# Patient Record
Sex: Female | Born: 1985
Health system: Southern US, Community
[De-identification: ages and names within clinical notes are randomized; demographics above are authoritative.]

## PROBLEM LIST (undated history)

## (undated) DIAGNOSIS — N83209 Unspecified ovarian cyst, unspecified side: Secondary | ICD-10-CM

## (undated) DIAGNOSIS — R000 Tachycardia, unspecified: Secondary | ICD-10-CM

## (undated) DIAGNOSIS — Z5189 Encounter for other specified aftercare: Secondary | ICD-10-CM

## (undated) DIAGNOSIS — U099 Post covid-19 condition, unspecified: Secondary | ICD-10-CM

## (undated) DIAGNOSIS — F419 Anxiety disorder, unspecified: Secondary | ICD-10-CM

## (undated) DIAGNOSIS — B977 Papillomavirus as the cause of diseases classified elsewhere: Secondary | ICD-10-CM

## (undated) DIAGNOSIS — Z319 Encounter for procreative management, unspecified: Secondary | ICD-10-CM

## (undated) DIAGNOSIS — R0609 Other forms of dyspnea: Secondary | ICD-10-CM

## (undated) DIAGNOSIS — F32A Depression, unspecified: Secondary | ICD-10-CM

## (undated) DIAGNOSIS — J45909 Unspecified asthma, uncomplicated: Secondary | ICD-10-CM

## (undated) DIAGNOSIS — R569 Unspecified convulsions: Secondary | ICD-10-CM

## (undated) HISTORY — DX: Unspecified convulsions: R56.9

## (undated) HISTORY — PX: NO PAST SURGERIES: SHX2092

## (undated) HISTORY — DX: Encounter for other specified aftercare: Z51.89

---
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
## 2007-08-13 ENCOUNTER — Emergency Department (HOSPITAL_COMMUNITY): Admission: EM | Admit: 2007-08-13 | Discharge: 2007-08-13 | Payer: Self-pay | Admitting: Family Medicine

## 2008-06-06 ENCOUNTER — Other Ambulatory Visit: Payer: Self-pay | Admitting: Emergency Medicine

## 2008-06-06 ENCOUNTER — Inpatient Hospital Stay (HOSPITAL_COMMUNITY): Admission: AD | Admit: 2008-06-06 | Discharge: 2008-06-08 | Payer: Self-pay | Admitting: Obstetrics and Gynecology

## 2008-06-08 ENCOUNTER — Encounter: Payer: Self-pay | Admitting: Neurology

## 2008-06-14 ENCOUNTER — Inpatient Hospital Stay (HOSPITAL_COMMUNITY): Admission: AD | Admit: 2008-06-14 | Discharge: 2008-06-14 | Payer: Self-pay | Admitting: Obstetrics and Gynecology

## 2008-07-12 ENCOUNTER — Ambulatory Visit: Payer: Self-pay | Admitting: Oncology

## 2009-01-24 ENCOUNTER — Inpatient Hospital Stay (HOSPITAL_COMMUNITY): Admission: AD | Admit: 2009-01-24 | Discharge: 2009-01-24 | Payer: Self-pay | Admitting: Obstetrics and Gynecology

## 2009-06-09 ENCOUNTER — Emergency Department (HOSPITAL_COMMUNITY): Admission: EM | Admit: 2009-06-09 | Discharge: 2009-06-09 | Payer: Self-pay | Admitting: Emergency Medicine

## 2009-12-08 ENCOUNTER — Emergency Department (HOSPITAL_COMMUNITY): Admission: EM | Admit: 2009-12-08 | Discharge: 2009-12-08 | Payer: Self-pay | Admitting: Emergency Medicine

## 2010-07-14 LAB — URINALYSIS, ROUTINE W REFLEX MICROSCOPIC
Glucose, UA: NEGATIVE mg/dL
Hgb urine dipstick: NEGATIVE
Nitrite: NEGATIVE
Specific Gravity, Urine: 1.026 (ref 1.005–1.030)
pH: 7 (ref 5.0–8.0)

## 2010-07-14 LAB — BASIC METABOLIC PANEL
BUN: 8 mg/dL (ref 6–23)
Calcium: 9.6 mg/dL (ref 8.4–10.5)
Chloride: 106 mEq/L (ref 96–112)
GFR calc Af Amer: >60 mL/min (ref >60)
Potassium: 3.5 mEq/L (ref 3.5–5.1)

## 2010-07-14 LAB — CBC
MCH: 29.5 pg (ref 26.0–34.0)
Platelets: 147 10*3/uL — ABNORMAL LOW (ref 150–400)
RBC: 4.75 MIL/uL (ref 3.87–5.11)
RDW: 14.6 % (ref 11.5–15.5)

## 2010-07-14 LAB — URINE MICROSCOPIC-ADD ON

## 2010-07-14 LAB — DIFFERENTIAL
Basophils Absolute: 0 10*3/uL (ref 0.0–0.1)
Basophils Relative: 0 % (ref 0–1)
Eosinophils Relative: 0 % (ref 0–5)
Lymphs Abs: 0.4 10*3/uL — ABNORMAL LOW (ref 0.7–4.0)
Neutro Abs: 6 10*3/uL (ref 1.7–7.7)

## 2010-08-04 LAB — POCT PREGNANCY, URINE: Preg Test, Ur: NEGATIVE

## 2010-08-04 LAB — WET PREP, GENITAL
Trich, Wet Prep: NONE SEEN
Yeast Wet Prep HPF POC: NONE SEEN

## 2010-08-04 LAB — URINALYSIS, ROUTINE W REFLEX MICROSCOPIC
Glucose, UA: NEGATIVE mg/dL
Specific Gravity, Urine: 1.02 (ref 1.005–1.030)
Urobilinogen, UA: 0.2 mg/dL (ref 0.0–1.0)
pH: 6 (ref 5.0–8.0)

## 2010-08-04 LAB — CBC
MCHC: 31.8 g/dL (ref 30.0–36.0)
Platelets: 231 10*3/uL (ref 150–400)

## 2010-08-04 LAB — GC/CHLAMYDIA PROBE AMP, GENITAL
Chlamydia, DNA Probe: NEGATIVE
GC Probe Amp, Genital: NEGATIVE

## 2010-08-15 LAB — BASIC METABOLIC PANEL
CO2: 26 mEq/L (ref 19–32)
Chloride: 102 mEq/L (ref 96–112)
GFR calc Af Amer: >60 mL/min (ref >60)
Potassium: 3.4 mEq/L — ABNORMAL LOW (ref 3.5–5.1)
Sodium: 135 mEq/L (ref 135–145)

## 2010-08-15 LAB — CBC
HCT: 33.2 % — ABNORMAL LOW (ref 36.0–46.0)
Hemoglobin: 4.1 g/dL — CL (ref 12.0–15.0)
Hemoglobin: 3.8 g/dL — CL (ref 12.0–15.0)
Hemoglobin: 9.5 g/dL — ABNORMAL LOW (ref 12.0–15.0)
MCHC: 31.9 g/dL (ref 30.0–36.0)
MCHC: 32.5 g/dL (ref 30.0–36.0)
MCHC: 33.5 g/dL (ref 30.0–36.0)
MCV: 81.7 fL (ref 78.0–100.0)
MCV: 81.8 fL (ref 78.0–100.0)
Platelets: 281 10*3/uL (ref 150–400)
Platelets: 221 10*3/uL (ref 150–400)
RBC: 1.92 MIL/uL — ABNORMAL LOW (ref 3.87–5.11)
RBC: 1.82 MIL/uL — ABNORMAL LOW (ref 3.87–5.11)
RBC: 3.48 MIL/uL — ABNORMAL LOW (ref 3.87–5.11)
RBC: 4.06 MIL/uL (ref 3.87–5.11)
RDW: 17.6 % — ABNORMAL HIGH (ref 11.5–15.5)
WBC: 4.1 10*3/uL (ref 4.0–10.5)

## 2010-08-15 LAB — COMPREHENSIVE METABOLIC PANEL
AST: 19 U/L (ref 0–37)
Alkaline Phosphatase: 35 U/L — ABNORMAL LOW (ref 39–117)
BUN: 7 mg/dL (ref 6–23)
CO2: 27 mEq/L (ref 19–32)
Creatinine, Ser: 0.84 mg/dL (ref 0.4–1.2)
GFR calc Af Amer: >60 mL/min (ref >60)
GFR calc non Af Amer: >60 mL/min (ref >60)
Glucose, Bld: 109 mg/dL — ABNORMAL HIGH (ref 70–99)
Sodium: 137 mEq/L (ref 135–145)
Total Protein: 7 g/dL (ref 6.0–8.3)

## 2010-08-15 LAB — DIFFERENTIAL
Basophils Absolute: 0 10*3/uL (ref 0.0–0.1)
Lymphs Abs: 0.7 10*3/uL (ref 0.7–4.0)
Neutro Abs: 2.4 10*3/uL (ref 1.7–7.7)

## 2010-08-15 LAB — GC/CHLAMYDIA PROBE AMP, GENITAL
Chlamydia, DNA Probe: POSITIVE — AB
GC Probe Amp, Genital: NEGATIVE

## 2010-08-15 LAB — CROSSMATCH
ABO/RH(D): A POS
Antibody Screen: NEGATIVE

## 2010-08-15 LAB — HEMOGLOBINOPATHY EVALUATION: Hemoglobin Other: 0 % (ref 0.0–0.0)

## 2010-08-15 LAB — URINALYSIS, ROUTINE W REFLEX MICROSCOPIC
Glucose, UA: NEGATIVE mg/dL
Specific Gravity, Urine: 1.027 (ref 1.005–1.030)
pH: 6 (ref 5.0–8.0)

## 2010-08-15 LAB — WET PREP, GENITAL
Clue Cells Wet Prep HPF POC: NONE SEEN
WBC, Wet Prep HPF POC: NONE SEEN
Yeast Wet Prep HPF POC: NONE SEEN

## 2010-08-15 LAB — URINE MICROSCOPIC-ADD ON

## 2010-08-15 LAB — LIPASE, BLOOD: Lipase: 24 U/L (ref 11–59)

## 2010-08-15 LAB — APTT: aPTT: 33 seconds (ref 24–37)

## 2010-08-15 LAB — VON WILLEBRAND PANEL: Von Willebrand Ag: 136 % normal (ref 61–164)

## 2010-08-15 LAB — ABO/RH: ABO/RH(D): A POS

## 2010-08-15 LAB — PROTIME-INR: Prothrombin Time: 15.5 seconds — ABNORMAL HIGH (ref 11.6–15.2)

## 2010-09-12 NOTE — Consult Note (Signed)
NAMEMarland Kitchen  MARITES, [name]          ACCOUNT NO.:  [id number]   MEDICAL RECORD NO.:  [id number]          PATIENT TYPE:  INP   LOCATION:  9373                          FACILITY:  [facility]   PHYSICIAN:  Melvyn Novas, M.D.  DATE OF BIRTH:  20-Sep-1985   DATE OF CONSULTATION:  06/07/2008  DATE OF DISCHARGE:                                 CONSULTATION   This is a 25 year old African American young female patient of Dr.  Su Hilt admitted in the night from June 06, 2008 to June 07, 2008.  Jordan [name] presented with rather diffuse feeling of weakness,  was crying uncontrollably and was very anxious when she arrived at the  [facility] ER.  It turned out that the patient had a fairly severe  menstrual bleed and had lost significant amount of blood judged by her  hematocrit, hemoglobin .  Her hemoglobin had been as low as 3.8 on  June 06, 2008.  After receiving transfusion it  has been brought up  to 9.5 and the patient now feels much better although she is still not  quite where she would like to be from her strength and energy level.  She is a 25 year old [place] who had today undergone a normal vaginal  ultrasound which did not reveal any myomata or any source of bleed and  she has started a birth control pill to control future menstrual flow.  During a CT scan that was also obtained today she developed involuntary  movements mainly of the right upper extremity.  These were not rhythmic  and they did appear somewhat in clusters.  The patient was again  described as rather anxious.  She had no associated mental status  changes or amnesia.  There was no convulsive activity noted.  No cranial  nerve abnormality.  No facial abnormality.   Her vital signs remained stable.  Her temperature 98.7.  Her blood  pressure in 130s, her respiratory rate in the 20s.  Lungs are clear to  auscultation.  She has no bruising, no clubbing, no cyanosis, no tongue  bite.  She did not suffer any  incontinence.  She has normal mucous  membranes that appear less pale than they did earlier today according to  her nurse.  Her mental status is now alert, oriented x3.  The patient  just came out of the bathroom, took a shower without assistance.  She is  very pleasant, cooperative, eloquent.  Shows no cranial nerve  abnormalities.  Her pupils react equal to light and accommodation.  There is no nystagmus.  No facial droop.  The sensory is intact.  Tongue  and uvula are midline without any evidence of a tongue tremor or vocal  cord tremor.  Motor examination shows normal tone, increased brisk  reflexes which are symmetric but deep tendon reflexes are without clonus  and down going toes with Babinski response observed.  During this exam  and while the patient rested in a supine position, she developed a  tremor in the right hand that began with a very mild amplitude that  slowly increased and then involved the upper extremity totally.  The  patient states I am relaxed.  I am not anxious now.  I see no  cogwheeling.  I can passively move the arm and the tremor seat subsides.  There is no ataxia.  No myoclonus.   ASSESSMENT:  Given the patient's recent low hemoglobin and the fast  replenishment, I cannot rule out that an organic tremor may be present  and that the patient may have suffered a watershed infarct, but the lack  of any cranial nerve sensory abnormalities or deep tendon reflex  asymmetry would lead me to believe that this is unlikely.  I have still  recommended an MRI to confirm that there has been no diffusion weighted  change.  I believe that this is a non-organic tremor and may be a  response to the anxiety and stressors that the patient has been exposed  to recently.  I  promised the patient an Ambien pill for the night and that tomorrow if  the MRI can be done at [facility] or [facility], she should be able to  be discharged if it shows no abnormalities depending on the  read of the  local radiologist.      Melvyn Novas, M.D.  Electronically Signed     CD/MEDQ  D:  06/07/2008  T:  06/07/2008  Job:  [id number]   cc:   Osborn Coho, M.D.  Fax: [phone]

## 2010-09-15 NOTE — Discharge Summary (Signed)
[name], [name]          ACCOUNT NO.:  [id number]   MEDICAL RECORD NO.:  [id number]          PATIENT TYPE:  INP   LOCATION:  9320                          [facility]ACILITY:  [facility]   PHYSICIAN:  Osborn Coho, M.D.   DATE OF BIRTH:  [birth year]   DATE OF ADMISSION:  06/06/2008  DATE OF DISCHARGE:  06/08/2008                               DISCHARGE SUMMARY   DISCHARGE DIAGNOSES:  1. Menorrhagia.  2. Severe anemia.  3. Upper extremity tremor.   HISTORY OF PRESENT ILLNESS:  [name] is a 25 year old female,  gravida 0, transferred from [facility] after being evaluated  for a near-syncopal episode and during which visit, she was found to be  profoundly anemic with a hemoglobin of 4.1.  The patient was transfused  1 unit of packed red blood cells at [facility] prior to her  transfer.  Upon arrival at [facility] [place], her hemoglobin  was found to be 3.8.  Her basic metabolic panel was essentially within  normal limits though her potassium was slightly low at 3.4.  Her TSH was  within normal limits.  She was negative for von Willebrand.  Her INR was  normal as was her PTT.  The patient underwent a pelvic ultrasound, which  was normal.  After receiving 5 units of packed red blood cells, the  patient's hemoglobin increased to 9.5.  The patient had been previously  symptomatic with a near-syncopal episode, feeling very weak, however,  reported feeling much better following her transfusion.  During the  course of the patient's evaluation, she developed a right upper  extremity tremor, which was evaluated by the neurologist with her CT  scan not revealing any acute process.  The patient was scheduled to  follow up on an outpatient basis for an MRI, though neurologist felt  certain that her tremor was nonorganic in nature.  By hospital day #3,  the patient was clinically improved and was, therefore, deemed ready for  discharge home to follow up on an out  patient basis for her neurologic  evaluation and further management of her menorrhagia.   DISCHARGE MEDICATIONS:  Ovcon 35 one tablet daily.   FOLLOWUP:  The patient is to call [facility] at [phone] to  schedule a followup visit with Dr. Su Hilt in 1 week.   DISCHARGE INSTRUCTIONS:  The patient was advised to call for any heavy  bleeding or other problems.  Activity was without restriction.  Diet was  without restriction.      [place] J. Adline Peals.      Osborn Coho, M.D.  Electronically Signed   EJP/MEDQ  D:  06/30/2008  T:  06/30/2008  Job:  [id number]

## 2011-02-09 LAB — POCT RAPID STREP A: Streptococcus, Group A Screen (Direct): NEGATIVE

## 2013-03-10 ENCOUNTER — Emergency Department (INDEPENDENT_AMBULATORY_CARE_PROVIDER_SITE_OTHER)
Admission: EM | Admit: 2013-03-10 | Discharge: 2013-03-10 | Disposition: A | Discharge status: Home / Self Care | Payer: Self-pay | Source: Home / Self Care | Attending: Family Medicine | Admitting: Family Medicine

## 2013-03-10 ENCOUNTER — Encounter (HOSPITAL_COMMUNITY): Payer: Self-pay | Admitting: Emergency Medicine

## 2013-03-10 DIAGNOSIS — M549 Dorsalgia, unspecified: Secondary | ICD-10-CM

## 2013-03-10 MED ORDER — TRAMADOL HCL 50 MG PO TABS: 50.0000 mg | ORAL_TABLET | Freq: Four times a day (QID) | ORAL | Status: DC | PRN

## 2013-03-10 MED ORDER — CYCLOBENZAPRINE HCL 5 MG PO TABS: 5.0000 mg | ORAL_TABLET | Freq: Three times a day (TID) | ORAL | Status: DC | PRN
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
## 2013-03-17 ENCOUNTER — Encounter (HOSPITAL_COMMUNITY): Payer: Self-pay | Admitting: Emergency Medicine

## 2013-03-17 ENCOUNTER — Emergency Department (HOSPITAL_COMMUNITY)
Admission: EM | Admit: 2013-03-17 | Discharge: 2013-03-17 | Disposition: A | Discharge status: Home / Self Care | Payer: No Typology Code available for payment source | Attending: Emergency Medicine | Admitting: Emergency Medicine

## 2013-03-17 DIAGNOSIS — S39012A Strain of muscle, fascia and tendon of lower back, initial encounter: Secondary | ICD-10-CM

## 2013-03-17 DIAGNOSIS — Z3202 Encounter for pregnancy test, result negative: Secondary | ICD-10-CM | POA: Insufficient documentation

## 2013-03-17 DIAGNOSIS — A599 Trichomoniasis, unspecified: Secondary | ICD-10-CM

## 2013-03-17 DIAGNOSIS — Y939 Activity, unspecified: Secondary | ICD-10-CM | POA: Insufficient documentation

## 2013-03-17 DIAGNOSIS — A5900 Urogenital trichomoniasis, unspecified: Secondary | ICD-10-CM | POA: Insufficient documentation

## 2013-03-17 DIAGNOSIS — Y92410 Unspecified street and highway as the place of occurrence of the external cause: Secondary | ICD-10-CM | POA: Insufficient documentation

## 2013-03-17 DIAGNOSIS — S335XXA Sprain of ligaments of lumbar spine, initial encounter: Secondary | ICD-10-CM | POA: Insufficient documentation

## 2013-03-17 LAB — URINALYSIS, ROUTINE W REFLEX MICROSCOPIC
Bilirubin Urine: NEGATIVE
Ketones, ur: 15 mg/dL — AB
Nitrite: NEGATIVE
pH: 5.5 (ref 5.0–8.0)

## 2013-03-17 LAB — URINE MICROSCOPIC-ADD ON

## 2013-03-17 MED ORDER — AZITHROMYCIN 250 MG PO TABS
1000.0000 mg | ORAL_TABLET | Freq: Once | ORAL | Status: AC
  Administered 2013-03-17: 1000 mg via ORAL
  Filled 2013-03-17: qty 4

## 2013-03-17 MED ORDER — CYCLOBENZAPRINE HCL 5 MG PO TABS: 5.0000 mg | ORAL_TABLET | Freq: Three times a day (TID) | ORAL | Status: DC | PRN

## 2013-03-17 MED ORDER — LIDOCAINE HCL (PF) 1 % IJ SOLN
INTRAMUSCULAR | Status: AC
  Administered 2013-03-17: 0.9 mL
  Filled 2013-03-17: qty 5

## 2013-03-17 MED ORDER — METRONIDAZOLE 500 MG PO TABS: 500.0000 mg | ORAL_TABLET | Freq: Two times a day (BID) | ORAL | Status: DC

## 2013-03-17 MED ORDER — CEFTRIAXONE SODIUM 250 MG IJ SOLR
250.0000 mg | Freq: Once | INTRAMUSCULAR | Status: AC
  Administered 2013-03-17: 250 mg via INTRAMUSCULAR
  Filled 2013-03-17: qty 250
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
## 2013-03-18 LAB — URINE CULTURE

## 2013-03-21 NOTE — ED Provider Notes (Signed)
Medical screening examination/treatment/procedure(s) were performed by non-physician practitioner and as supervising physician I was immediately available for consultation/collaboration.  EKG Interpretation   None         Arlon Bleier W. Demarkus Remmel, MD 03/21/13 1327 

## 2013-06-12 ENCOUNTER — Encounter (HOSPITAL_COMMUNITY): Payer: Self-pay | Admitting: Emergency Medicine

## 2013-06-12 ENCOUNTER — Emergency Department (HOSPITAL_COMMUNITY)
Admission: EM | Admit: 2013-06-12 | Discharge: 2013-06-12 | Disposition: A | Discharge status: Home / Self Care | Payer: No Typology Code available for payment source | Attending: Emergency Medicine | Admitting: Emergency Medicine

## 2013-06-12 DIAGNOSIS — N898 Other specified noninflammatory disorders of vagina: Secondary | ICD-10-CM | POA: Insufficient documentation

## 2013-06-12 DIAGNOSIS — Z113 Encounter for screening for infections with a predominantly sexual mode of transmission: Secondary | ICD-10-CM | POA: Insufficient documentation

## 2013-06-12 DIAGNOSIS — R1110 Vomiting, unspecified: Secondary | ICD-10-CM

## 2013-06-12 DIAGNOSIS — Z711 Person with feared health complaint in whom no diagnosis is made: Secondary | ICD-10-CM

## 2013-06-12 DIAGNOSIS — Z3202 Encounter for pregnancy test, result negative: Secondary | ICD-10-CM | POA: Insufficient documentation

## 2013-06-12 DIAGNOSIS — R112 Nausea with vomiting, unspecified: Secondary | ICD-10-CM | POA: Insufficient documentation

## 2013-06-12 DIAGNOSIS — R1013 Epigastric pain: Secondary | ICD-10-CM | POA: Insufficient documentation

## 2013-06-12 LAB — CBC WITH DIFFERENTIAL/PLATELET
BASOS ABS: 0 10*3/uL (ref 0.0–0.1)
BASOS PCT: 1 % (ref 0–1)
Eosinophils Absolute: 0.1 10*3/uL (ref 0.0–0.7)
Eosinophils Relative: 2 % (ref 0–5)
HCT: 41.2 % (ref 36.0–46.0)
Hemoglobin: 14 g/dL (ref 12.0–15.0)
Lymphocytes Relative: 46 % (ref 12–46)
Lymphs Abs: 1.5 10*3/uL (ref 0.7–4.0)
MCH: 31.4 pg (ref 26.0–34.0)
MCHC: 34 g/dL (ref 30.0–36.0)
MCV: 92.4 fL (ref 78.0–100.0)
Monocytes Absolute: 0.3 10*3/uL (ref 0.1–1.0)
Monocytes Relative: 10 % (ref 3–12)
NEUTROS ABS: 1.3 10*3/uL — AB (ref 1.7–7.7)
NEUTROS PCT: 41 % — AB (ref 43–77)
PLATELETS: 212 10*3/uL (ref 150–400)
RBC: 4.46 MIL/uL (ref 3.87–5.11)
RDW: 12.4 % (ref 11.5–15.5)
WBC: 3.3 10*3/uL — ABNORMAL LOW (ref 4.0–10.5)

## 2013-06-12 LAB — BASIC METABOLIC PANEL
BUN: 8 mg/dL (ref 6–23)
CHLORIDE: 104 meq/L (ref 96–112)
CO2: 26 mEq/L (ref 19–32)
CREATININE: 0.78 mg/dL (ref 0.50–1.10)
Calcium: 9.1 mg/dL (ref 8.4–10.5)
GFR calc non Af Amer: >90 mL/min (ref >90)
Glucose, Bld: 81 mg/dL (ref 70–99)
POTASSIUM: 3.5 meq/L — AB (ref 3.7–5.3)
SODIUM: 143 meq/L (ref 137–147)

## 2013-06-12 LAB — URINALYSIS, ROUTINE W REFLEX MICROSCOPIC
Bilirubin Urine: NEGATIVE
Glucose, UA: NEGATIVE mg/dL
KETONES UR: NEGATIVE mg/dL
LEUKOCYTES UA: NEGATIVE
NITRITE: NEGATIVE
PH: 5.5 (ref 5.0–8.0)
Protein, ur: NEGATIVE mg/dL
SPECIFIC GRAVITY, URINE: 1.033 — AB (ref 1.005–1.030)
Urobilinogen, UA: 1 mg/dL (ref 0.0–1.0)

## 2013-06-12 LAB — WET PREP, GENITAL
Clue Cells Wet Prep HPF POC: NONE SEEN
Trich, Wet Prep: NONE SEEN
WBC WET PREP: NONE SEEN
YEAST WET PREP: NONE SEEN

## 2013-06-12 LAB — URINE MICROSCOPIC-ADD ON

## 2013-06-12 LAB — PREGNANCY, URINE: Preg Test, Ur: NEGATIVE

## 2013-06-12 LAB — POCT PREGNANCY, URINE: Preg Test, Ur: NEGATIVE

## 2013-06-12 MED ORDER — LIDOCAINE HCL (PF) 1 % IJ SOLN
INTRAMUSCULAR | Status: AC
  Administered 2013-06-12: 5 mL
  Filled 2013-06-12: qty 5

## 2013-06-12 MED ORDER — ONDANSETRON 4 MG PO TBDP
4.0000 mg | ORAL_TABLET | Freq: Once | ORAL | Status: AC
  Administered 2013-06-12: 4 mg via ORAL
  Filled 2013-06-12: qty 1

## 2013-06-12 MED ORDER — ONDANSETRON HCL 4 MG PO TABS: 4.0000 mg | ORAL_TABLET | Freq: Four times a day (QID) | ORAL | Status: DC

## 2013-06-12 MED ORDER — CEFTRIAXONE SODIUM 250 MG IJ SOLR
250.0000 mg | Freq: Once | INTRAMUSCULAR | Status: AC
  Administered 2013-06-12: 250 mg via INTRAMUSCULAR
  Filled 2013-06-12: qty 250

## 2013-06-12 MED ORDER — AZITHROMYCIN 250 MG PO TABS
1000.0000 mg | ORAL_TABLET | Freq: Once | ORAL | Status: AC
  Administered 2013-06-12: 1000 mg via ORAL
  Filled 2013-06-12: qty 4

## 2013-06-12 NOTE — ED Notes (Signed)
Pt in c/o vomiting over the last week, states she isn't able to keep any food down, c/o abd cramping with this, denies other symptoms

## 2013-06-12 NOTE — Discharge Instructions (Signed)
Nausea and Vomiting Nausea means you feel sick to your stomach. Throwing up (vomiting) is a reflex where stomach contents come out of your mouth. HOME CARE   Take medicine as told by your doctor.  Do not force yourself to eat. However, you do need to drink fluids.  If you feel like eating, eat a normal diet as told by your doctor.  Eat rice, wheat, potatoes, bread, lean meats, yogurt, fruits, and vegetables.  Avoid high-fat foods.  Drink enough fluids to keep your pee (urine) clear or pale yellow.  Ask your doctor how to replace body fluid losses (rehydrate). Signs of body fluid loss (dehydration) include:  Feeling very thirsty.  Dry lips and mouth.  Feeling dizzy.  Dark pee.  Peeing less than normal.  Feeling confused.  Fast breathing or heart rate. GET HELP RIGHT AWAY IF:   You have blood in your throw up.  You have black or bloody poop (stool).  You have a bad headache or stiff neck.  You feel confused.  You have bad belly (abdominal) pain.  You have chest pain or trouble breathing.  You do not pee at least once every 8 hours.  You have cold, clammy skin.  You keep throwing up after 24 to 48 hours.  You have a fever. MAKE SURE YOU:   Understand these instructions.  Will watch your condition.  Will get help right away if you are not doing well or get worse. Document Released: 10/03/2007 Document Revised: 07/09/2011 Document Reviewed: 09/15/2010 [facility] Patient Information 2014 [place], [place]. Sexually Transmitted Disease A sexually transmitted disease (STD) is a disease or infection that may be passed (transmitted) from person to person, usually during sexual activity. This may happen by way of saliva, semen, blood, vaginal mucus, or urine. Common STDs include:   Gonorrhea.   Chlamydia.   Syphilis.   HIV and AIDS.   Genital herpes.   Hepatitis B and C.   Trichomonas.   Human papillomavirus (HPV).   Pubic lice.    Scabies.  Mites.  Bacterial vaginosis. WHAT ARE CAUSES OF STDs? An STD may be caused by bacteria, a virus, or parasites. STDs are often transmitted during sexual activity if one person is infected. However, they may also be transmitted through nonsexual means. STDs may be transmitted after:   Sexual intercourse with an infected person.   Sharing sex toys with an infected person.   Sharing needles with an infected person or using unclean piercing or tattoo needles.  Having intimate contact with the genitals, mouth, or rectal areas of an infected person.   Exposure to infected fluids during birth. WHAT ARE THE SIGNS AND SYMPTOMS OF STDs? Different STDs have different symptoms. Some people may not have any symptoms. If symptoms are present, they may include:   Painful or bloody urination.   Pain in the pelvis, abdomen, vagina, anus, throat, or eyes.   Skin rash, itching, irritation, growths, sores (lesions), ulcerations, or warts in the genital or anal area.  Abnormal vaginal discharge with or without bad odor.   Penile discharge in men.   Fever.   Pain or bleeding during sexual intercourse.   Swollen glands in the groin area.   Yellow skin and eyes (jaundice). This is seen with hepatitis.   Swollen testicles.  Infertility.  Sores and blisters in the mouth. HOW ARE STDs DIAGNOSED? To make a diagnosis, your health care provider may:   Take a medical history.   Perform a physical exam.   Take  a sample of any discharge for examination.  Swab the throat, cervix, opening to the penis, rectum, or vagina for testing.  Test a sample of your first morning urine.   Perform blood tests.   Perform a Pap smear, if this applies.   Perform a colposcopy.   Perform a laparoscopy.  HOW ARE STDs TREATED? Treatment depends on the STD. Some STDs may be treated but not cured.   Chlamydia, gonorrhea, trichomonas, and syphilis can be cured with  antibiotics.   Genital herpes, hepatitis, and HIV can be treated, but not cured, with prescribed medicines. The medicines lessen symptoms.   Genital warts from HPV can be treated with medicine or by freezing, burning (electrocautery), or surgery. Warts may come back.   HPV cannot be cured with medicine or surgery. However, abnormal areas may be removed from the cervix, vagina, or vulva.   If your diagnosis is confirmed, your recent sexual partners need treatment. This is true even if they are symptom-free or have a negative culture or evaluation. They should not have sex until their health care providers say it is OK. HOW CAN I REDUCE MY RISK OF GETTING AN STD?  Use latex condoms, dental dams, and water-soluble lubricants during sexual activity. Do not use petroleum jelly or oils.  Get vaccinated for HPV and hepatitis. If you have not received these vaccines in the past, talk to your health care provider about whether one or both might be right for you.   Avoid risky sex practices that can break the skin.  WHAT SHOULD I DO IF I THINK I HAVE AN STD?  See your health care provider.   Inform all sexual partners. They should be tested and treated for any STDs.  Do not have sex until your health care provider says it is OK. WHEN SHOULD I GET HELP? Seek immediate medical care if:  You develop severe abdominal pain.  You are a man and notice swelling or pain in the testicles.  You are a woman and notice swelling or pain in your vagina. Document Released: 07/07/2002 Document Revised: 02/04/2013 Document Reviewed: 11/04/2012 [facility] Patient Information 2014 [place], [place].

## 2013-06-12 NOTE — ED Provider Notes (Signed)
CSN: [id number]     Arrival date & time 06/12/13  1343 History   First MD Initiated Contact with Patient 06/12/13 1846     Chief Complaint  Patient presents with  . Emesis     (Consider location/radiation/quality/duration/timing/severity/associated sxs/prior Treatment) HPI  This a 28 year old female who presents with vomiting. She states that she has had for 5 days of nonbilious, nonbloody emesis. She's not been able to keep anything down. She also reports epigastric abdominal cramping and lower abdominal cramping. Nothing makes it better or worse. It is nonradiating. Currently she is pain-free. She denies any diarrhea. She denies any urinary symptoms. She is unsure of the date of her last menstrual period.  Patient does report scant vaginal discharge. She uses condoms for birth control and STD protection. She would like to be treated for STDs.  History reviewed. No pertinent past medical history. History reviewed. No pertinent past surgical history. History reviewed. No pertinent family history. History  Substance Use Topics  . Smoking status: Never Smoker   . Smokeless tobacco: Not on file  . Alcohol Use: No   OB History   Grav Para Term Preterm Abortions TAB SAB Ect Mult Living                 Review of Systems  Constitutional: Negative for fever.  Respiratory: Negative for cough, chest tightness and shortness of breath.   Cardiovascular: Negative for chest pain.  Gastrointestinal: Positive for nausea, vomiting and abdominal pain. Negative for diarrhea and constipation.  Genitourinary: Positive for vaginal discharge. Negative for dysuria and vaginal bleeding.  Musculoskeletal: Negative for back pain.  Neurological: Negative for headaches.  All other systems reviewed and are negative.      Allergies  Review of patient's allergies indicates no known allergies.  Home Medications   Current Outpatient Rx  Name  Route  Sig  Dispense  Refill  . ondansetron (ZOFRAN) 4 MG  tablet   Oral   Take 1 tablet (4 mg total) by mouth every 6 (six) hours.   12 tablet   0    BP 129/91  Pulse 68  Temp(Src) 97.6 F (36.4 C) (Oral)  Resp 18  SpO2 100% Physical Exam  Nursing note and vitals reviewed. Constitutional: She is oriented to person, place, and time. She appears well-developed and well-nourished. No distress.  HENT:  Head: Normocephalic and atraumatic.  Eyes: Pupils are equal, round, and reactive to light.  Neck: Neck supple.  Cardiovascular: Normal rate, regular rhythm and normal heart sounds.   Pulmonary/Chest: Effort normal and breath sounds normal. No respiratory distress. She has no wheezes.  Abdominal: Soft. Bowel sounds are normal.  Genitourinary:  Scant vaginal discharge, NO CMT, no adnexal tenderness  Neurological: She is alert and oriented to person, place, and time.  Skin: Skin is warm and dry.  Psychiatric: She has a normal mood and affect.    ED Course  Procedures (including critical care time) Labs Review Labs Reviewed  CBC WITH DIFFERENTIAL - Abnormal; Notable for the following:    WBC 3.3 (*)    Neutrophils Relative % 41 (*)    Neutro Abs 1.3 (*)    All other components within normal limits  BASIC METABOLIC PANEL - Abnormal; Notable for the following:    Potassium 3.5 (*)    All other components within normal limits  URINALYSIS, ROUTINE W REFLEX MICROSCOPIC - Abnormal; Notable for the following:    APPearance HAZY (*)    Specific Gravity, Urine 1.033 (*)  Hgb urine dipstick SMALL (*)    All other components within normal limits  URINE MICROSCOPIC-ADD ON - Abnormal; Notable for the following:    Squamous Epithelial / LPF MANY (*)    Bacteria, UA FEW (*)    All other components within normal limits  WET PREP, GENITAL  GC/CHLAMYDIA PROBE AMP  PREGNANCY, URINE  RPR  HIV ANTIBODY (ROUTINE TESTING)  POCT PREGNANCY, URINE   Imaging Review No results found.  EKG Interpretation   None      Medications  ondansetron  (ZOFRAN-ODT) disintegrating tablet 4 mg (4 mg Oral Given 06/12/13 1909)  azithromycin (ZITHROMAX) tablet 1,000 mg (1,000 mg Oral Given 06/12/13 2017)  cefTRIAXone (ROCEPHIN) injection 250 mg (250 mg Intramuscular Given 06/12/13 2028)  lidocaine (PF) (XYLOCAINE) 1 % injection (5 mLs  Given 06/12/13 2028)   MDM   Final diagnoses:  Emesis  Concern about STD in female without diagnosis    Patient with emesis and abdominal pain.  Nontoxic on exam. VS wnl.  Labs reassuring.  Patient given zofran and able to tolerate po.  Treated for STDs per request.  Pelvic exam unremarkable.  Will discharge with PCP follow-up.  After history, exam, and medical workup I feel the patient has been appropriately medically screened and is safe for discharge home. Pertinent diagnoses were discussed with the patient. Patient was given return precautions.    Shon Baton, MD 06/12/13 2147

## 2013-06-13 LAB — GC/CHLAMYDIA PROBE AMP
CT Probe RNA: NEGATIVE
GC Probe RNA: NEGATIVE

## 2013-06-13 LAB — HIV ANTIBODY (ROUTINE TESTING W REFLEX): HIV: NONREACTIVE

## 2013-06-13 LAB — RPR: RPR: NONREACTIVE

## 2014-02-23 ENCOUNTER — Emergency Department: Payer: Self-pay | Admitting: Emergency Medicine

## 2015-10-25 ENCOUNTER — Encounter: Payer: Self-pay | Admitting: Emergency Medicine

## 2015-10-25 ENCOUNTER — Emergency Department
Admission: EM | Admit: 2015-10-25 | Discharge: 2015-10-25 | Disposition: A | Discharge status: Home / Self Care | Payer: No Typology Code available for payment source | Attending: Emergency Medicine | Admitting: Emergency Medicine

## 2015-10-25 DIAGNOSIS — Z79899 Other long term (current) drug therapy: Secondary | ICD-10-CM | POA: Insufficient documentation

## 2015-10-25 DIAGNOSIS — K0889 Other specified disorders of teeth and supporting structures: Secondary | ICD-10-CM | POA: Insufficient documentation

## 2015-10-25 MED ORDER — OXYCODONE-ACETAMINOPHEN 5-325 MG PO TABS: 1.0000 | ORAL_TABLET | ORAL | Status: DC | PRN

## 2015-10-25 MED ORDER — LIDOCAINE VISCOUS 2 % MT SOLN: 20.0000 mL | OROMUCOSAL | Status: DC | PRN

## 2015-10-25 MED ORDER — PENICILLIN V POTASSIUM 250 MG PO TABS: 250.0000 mg | ORAL_TABLET | Freq: Four times a day (QID) | ORAL | Status: DC

## 2015-10-25 MED ORDER — IBUPROFEN 800 MG PO TABS: 800.0000 mg | ORAL_TABLET | Freq: Three times a day (TID) | ORAL | Status: DC | PRN

## 2015-10-25 NOTE — ED Notes (Signed)
States she has a broken tooth to right gumline  Increased pain with min swelling noted

## 2015-10-25 NOTE — ED Provider Notes (Signed)
[facility] Emergency Department Provider Note  ____________________________________________  Time seen: Approximately 10:22 AM  I have reviewed the triage vital signs and the nursing notes.   HISTORY  Chief Complaint Dental Pain    HPI Jordan [name] is a 30 y.o. female presents for evaluation of dental pain. Patient states past medical history same as not been able to see a dentist. However she reports that she's got a dental appointment in one week. Complains of pain to the right lower wisdom tooth. Pain is 10 over 10 worsened over the past 4 days.   History reviewed. No pertinent past medical history.  There are no active problems to display for this patient.   History reviewed. No pertinent past surgical history.  Current Outpatient Rx  Name  Route  Sig  Dispense  Refill  . ibuprofen (ADVIL,MOTRIN) 800 MG tablet   Oral   Take 1 tablet (800 mg total) by mouth every 8 (eight) hours as needed.   30 tablet   0   . lidocaine (XYLOCAINE) 2 % solution   Mouth/Throat   Use as directed 20 mLs in the mouth or throat as needed for mouth pain.   100 mL   0   . ondansetron (ZOFRAN) 4 MG tablet   Oral   Take 1 tablet (4 mg total) by mouth every 6 (six) hours.   12 tablet   0   . oxyCODONE-acetaminophen (ROXICET) 5-325 MG tablet   Oral   Take 1-2 tablets by mouth every 4 (four) hours as needed for severe pain.   15 tablet   0   . penicillin v potassium (VEETID) 250 MG tablet   Oral   Take 1 tablet (250 mg total) by mouth 4 (four) times daily.   40 tablet   0     Allergies Review of patient's allergies indicates no known allergies.  No family history on file.  Social History Social History  Substance Use Topics  . Smoking status: Never Smoker   . Smokeless tobacco: None  . Alcohol Use: No    Review of Systems Constitutional: No fever/chills ENT: Dental pain and right lower wisdom tooth. Cardiovascular: Denies chest  pain. Respiratory: Denies shortness of breath. Musculoskeletal: Negative for back pain. Skin: Negative for rash. Neurological: Negative for headaches, focal weakness or numbness.  10-point ROS otherwise negative.  ____________________________________________   PHYSICAL EXAM:  VITAL SIGNS: ED Triage Vitals  Enc Vitals Group     BP 10/25/15 0946 130/92 mmHg     Pulse Rate 10/25/15 0946 72     Resp 10/25/15 0946 14     Temp 10/25/15 0946 98.7 F (37.1 C)     Temp Source 10/25/15 0946 Oral     SpO2 10/25/15 0946 100 %     Weight 10/25/15 0946 136 lb (61.689 kg)     Height 10/25/15 0946 5\' 6"  (1.676 m)     Head Cir --      Peak Flow --      Pain Score 10/25/15 0953 10     Pain Loc --      Pain Edu? --      Excl. in GC? --     Constitutional: Alert and oriented. Well appearing and in no acute distress. Mouth/Throat: Mucous membranes are moist.  Oropharynx non-erythematous.Obvious dental caries in the right lower wisdom tooth. Neck: No stridor.  Supple full range of motion. Cardiovascular: Normal rate, regular rhythm. Grossly normal heart sounds.  Good peripheral circulation.  Respiratory: Normal respiratory effort.  No retractions. Lungs CTAB. Neurologic:  Normal speech and language. No gross focal neurologic deficits are appreciated. No gait instability. Skin:  Skin is warm, dry and intact. No rash noted. Psychiatric: Mood and affect are normal. Speech and behavior are normal.  ____________________________________________   LABS (all labs ordered are listed, but only abnormal results are displayed)  Labs Reviewed - No data to display ____________________________________________  EKG   ____________________________________________  RADIOLOGY   ____________________________________________   PROCEDURES  Procedure(s) performed: None  Critical Care performed: No  ____________________________________________   INITIAL IMPRESSION / ASSESSMENT AND PLAN / ED  COURSE  Pertinent labs & imaging results that were available during my care of the patient were reviewed by me and considered in my medical decision making (see chart for details).  Dental caries with abscess. Rx given for penicillin 250 4 times a day 10 days, ibuprofen 800, platelet 5/325 and viscous lidocaine. Patient follow-up with dentist on Monday as scheduled. She voices no other emergency medical complaints at this time. ____________________________________________   FINAL CLINICAL IMPRESSION(S) / ED DIAGNOSES  Final diagnoses:  Pain, dental     This chart was dictated using voice recognition software/Dragon. Despite best efforts to proofread, errors can occur which can change the meaning. Any change was purely unintentional.   Evangeline Dakinharles M Beers, PA-C 10/25/15 1128  Emily FilbertJonathan E Williams, MD 10/25/15 [phone]

## 2015-10-25 NOTE — ED Notes (Signed)
Pt with R lower, back tooth pain x4 days.  Pt has dentist appoint for 6 days from now.  Pt also states she has been prescribed antibiotics for the same pain in the recent past, but never got the tooth pulled.  States she did not finish the course of abx.

## 2016-01-03 ENCOUNTER — Emergency Department
Admission: EM | Admit: 2016-01-03 | Discharge: 2016-01-03 | Disposition: A | Discharge status: Home / Self Care | Payer: Self-pay | Attending: Emergency Medicine | Admitting: Emergency Medicine

## 2016-01-03 ENCOUNTER — Encounter: Payer: Self-pay | Admitting: Emergency Medicine

## 2016-01-03 DIAGNOSIS — K029 Dental caries, unspecified: Secondary | ICD-10-CM | POA: Insufficient documentation

## 2016-01-03 DIAGNOSIS — K0889 Other specified disorders of teeth and supporting structures: Secondary | ICD-10-CM

## 2016-01-03 MED ORDER — SULFAMETHOXAZOLE-TRIMETHOPRIM 800-160 MG PO TABS
1.0000 | ORAL_TABLET | Freq: Once | ORAL | Status: AC
  Administered 2016-01-03: 1 via ORAL
  Filled 2016-01-03: qty 1

## 2016-01-03 MED ORDER — OXYCODONE-ACETAMINOPHEN 5-325 MG PO TABS: 1.0000 | ORAL_TABLET | ORAL | 0 refills | Status: DC | PRN

## 2016-01-03 MED ORDER — OXYCODONE-ACETAMINOPHEN 5-325 MG PO TABS
1.0000 | ORAL_TABLET | Freq: Once | ORAL | Status: AC
  Administered 2016-01-03: 1 via ORAL
  Filled 2016-01-03: qty 1

## 2016-01-03 MED ORDER — SULFAMETHOXAZOLE-TRIMETHOPRIM 800-160 MG PO TABS: 1.0000 | ORAL_TABLET | Freq: Two times a day (BID) | ORAL | 0 refills | Status: DC

## 2016-01-03 NOTE — ED Triage Notes (Signed)
Here for dental pain   Swelling to right side of face d/t toothache

## 2016-01-03 NOTE — ED Provider Notes (Signed)
[facility] Emergency Department Provider Note  ____________________________________________  Time seen: Approximately 3:39 PM  I have reviewed the triage vital signs and the nursing notes.   HISTORY  Chief Complaint Dental Pain    HPI SHAMONIQUE BATTISTE is a 30 y.o. female Presents for evaluation of dental pain. Patient states that she's had pain for the last 2-3 days. Last time she was here she had a tooth extracted and is scheduled to have this when extracted on Friday. The pain is radiating to her ear and causing her jaw to be swollen. Denies any trauma. Denies any fever chills.   History reviewed. No pertinent past medical history.  There are no active problems to display for this patient.   History reviewed. No pertinent surgical history.  Prior to Admission medications   Medication Sig Start Date End Date Taking? Authorizing Provider  oxyCODONE-acetaminophen (ROXICET) 5-325 MG tablet Take 1-2 tablets by mouth every 4 (four) hours as needed for severe pain. 01/03/16   Charmayne Sheer Kharizma Lesnick, PA-C  sulfamethoxazole-trimethoprim (BACTRIM DS,SEPTRA DS) 800-160 MG tablet Take 1 tablet by mouth 2 (two) times daily. 01/03/16   Evangeline Dakin, PA-C    Allergies Review of patient's allergies indicates no known allergies.  No family history on file.  Social History Social History  Substance Use Topics  . Smoking status: Never Smoker  . Smokeless tobacco: Never Used  . Alcohol use No    Review of Systems Constitutional: No fever/chills ENT: No sore throat.Positive for dental caries. Cardiovascular: Denies chest pain. Respiratory: Denies shortness of breath. Musculoskeletal: Negative for back pain. Skin: Negative for rash. Neurological: Negative for headaches, focal weakness or numbness.  10-point ROS otherwise negative.  ____________________________________________   PHYSICAL EXAM:  VITAL SIGNS: ED Triage Vitals  Enc Vitals Group     BP  01/03/16 1533 (!) 121/92     Pulse Rate 01/03/16 1533 (!) 137     Resp 01/03/16 1533 18     Temp 01/03/16 1533 98.5 F (36.9 C)     Temp Source 01/03/16 1533 Oral     SpO2 01/03/16 1533 99 %     Weight 01/03/16 1534 137 lb (62.1 kg)     Height 01/03/16 1534 5\' 6"  (1.676 m)     Head Circumference --      Peak Flow --      Pain Score --      Pain Loc --      Pain Edu? --      Excl. in GC? --     Constitutional: Alert and oriented. Well appearing and in no acute distress. Mouth/Throat: Mucous membranes are moist.  Oropharynx non-erythematous.Obvious dental caries with fractured tooth right lower molar. Neck: No stridor.   Neurologic:  Normal speech and language. No gross focal neurologic deficits are appreciated. No gait instability. Skin:  Skin is warm, dry and intact. No rash noted. Psychiatric: Mood and affect are normal. Speech and behavior are normal.  ____________________________________________   LABS (all labs ordered are listed, but only abnormal results are displayed)  Labs Reviewed - No data to display ____________________________________________  EKG   ____________________________________________  RADIOLOGY   ____________________________________________   PROCEDURES  Procedure(s) performed: None  Critical Care performed: No  ____________________________________________   INITIAL IMPRESSION / ASSESSMENT AND PLAN / ED COURSE  Pertinent labs & imaging results that were available during my care of the patient were reviewed by me and considered in my medical decision making (see chart for details). Review  of the [place] CSRS was performed in accordance of the [facility] prior to dispensing any controlled drugs.  Acute dental caries right lower molar. Rx given for Bactrim DS twice a day #20 and Percocet 5/325. Patient to keep her scheduled dental appointment on Friday as directed.  Clinical Course    ____________________________________________   FINAL  CLINICAL IMPRESSION(S) / ED DIAGNOSES  Final diagnoses:  Pain, dental  Dental caries     This chart was dictated using voice recognition software/Dragon. Despite best efforts to proofread, errors can occur which can change the meaning. Any change was purely unintentional.    Evangeline Dakinharles M Kimon Loewen, PA-C 01/03/16 1553    Sharman CheekPhillip Stafford, MD 01/03/16 [phone]

## 2016-01-03 NOTE — ED Notes (Signed)
Pt alert and oriented X4, active, cooperative, pt in NAD. RR even and unlabored, color WNL.  Pt informed to return if any life threatening symptoms occur.   

## 2016-12-12 ENCOUNTER — Emergency Department: Payer: Managed Care, Other (non HMO)

## 2016-12-12 ENCOUNTER — Emergency Department
Admission: EM | Admit: 2016-12-12 | Discharge: 2016-12-12 | Disposition: A | Discharge status: Home / Self Care | Payer: Managed Care, Other (non HMO) | Attending: Emergency Medicine | Admitting: Emergency Medicine

## 2016-12-12 DIAGNOSIS — E860 Dehydration: Secondary | ICD-10-CM | POA: Diagnosis not present

## 2016-12-12 DIAGNOSIS — R42 Dizziness and giddiness: Secondary | ICD-10-CM | POA: Diagnosis present

## 2016-12-12 DIAGNOSIS — R509 Fever, unspecified: Secondary | ICD-10-CM | POA: Diagnosis not present

## 2016-12-12 DIAGNOSIS — R109 Unspecified abdominal pain: Secondary | ICD-10-CM | POA: Insufficient documentation

## 2016-12-12 DIAGNOSIS — R112 Nausea with vomiting, unspecified: Secondary | ICD-10-CM | POA: Diagnosis not present

## 2016-12-12 LAB — CBC
HCT: 42.9 % (ref 35.0–47.0)
HEMOGLOBIN: 14.6 g/dL (ref 12.0–16.0)
MCH: 31.4 pg (ref 26.0–34.0)
MCHC: 34.1 g/dL (ref 32.0–36.0)
MCV: 92 fL (ref 80.0–100.0)
PLATELETS: 210 10*3/uL (ref 150–440)
RBC: 4.66 MIL/uL (ref 3.80–5.20)
RDW: 12.5 % (ref 11.5–14.5)
WBC: 9.2 10*3/uL (ref 3.6–11.0)

## 2016-12-12 LAB — BASIC METABOLIC PANEL
ANION GAP: 10 (ref 5–15)
BUN: 8 mg/dL (ref 6–20)
CALCIUM: 9.5 mg/dL (ref 8.9–10.3)
CO2: 25 mmol/L (ref 22–32)
CREATININE: 0.99 mg/dL (ref 0.44–1.00)
Chloride: 101 mmol/L (ref 101–111)
Glucose, Bld: 98 mg/dL (ref 65–99)
Potassium: 3.5 mmol/L (ref 3.5–5.1)
Sodium: 136 mmol/L (ref 135–145)

## 2016-12-12 LAB — POCT PREGNANCY, URINE: Preg Test, Ur: NEGATIVE

## 2016-12-12 LAB — URINALYSIS, COMPLETE (UACMP) WITH MICROSCOPIC
Bacteria, UA: NONE SEEN
Bilirubin Urine: NEGATIVE
Glucose, UA: NEGATIVE mg/dL
Ketones, ur: 80 mg/dL — AB
Leukocytes, UA: NEGATIVE
NITRITE: NEGATIVE
PH: 6 (ref 5.0–8.0)
Protein, ur: NEGATIVE mg/dL
Specific Gravity, Urine: 1.017 (ref 1.005–1.030)

## 2016-12-12 LAB — LACTIC ACID, PLASMA: Lactic Acid, Venous: 0.9 mmol/L (ref 0.5–1.9)

## 2016-12-12 MED ORDER — SODIUM CHLORIDE 0.9 % IV BOLUS (SEPSIS)
1000.0000 mL | Freq: Once | INTRAVENOUS | Status: AC
Start: 2016-12-12 — End: 2016-12-12
  Administered 2016-12-12: 1000 mL via INTRAVENOUS

## 2016-12-12 MED ORDER — ACETAMINOPHEN 500 MG PO TABS
1000.0000 mg | ORAL_TABLET | Freq: Once | ORAL | Status: AC
  Administered 2016-12-12: 1000 mg via ORAL
  Filled 2016-12-12 (×3): qty 2

## 2016-12-12 MED ORDER — ONDANSETRON HCL 4 MG/2ML IJ SOLN
4.0000 mg | Freq: Once | INTRAMUSCULAR | Status: AC
  Administered 2016-12-12: 4 mg via INTRAVENOUS

## 2016-12-12 MED ORDER — ONDANSETRON HCL 4 MG/2ML IJ SOLN
INTRAMUSCULAR | Status: DC
Start: 2016-12-12 — End: 2016-12-13
  Filled 2016-12-12: qty 2

## 2016-12-12 MED ORDER — ONDANSETRON HCL 4 MG PO TABS: 4.0000 mg | ORAL_TABLET | Freq: Three times a day (TID) | ORAL | 0 refills | Status: DC | PRN

## 2016-12-12 NOTE — ED Notes (Signed)
Patient ambulatory to CDU with steady gait and NAD noted.

## 2016-12-12 NOTE — Discharge Instructions (Signed)
Please seek medical attention for any high fevers, chest pain, shortness of breath, change in behavior, persistent vomiting, bloody stool or any other new or concerning symptoms.  

## 2016-12-12 NOTE — ED Provider Notes (Signed)
[facility] Emergency Department Provider Note   ____________________________________________   I have reviewed the triage vital signs and the nursing notes.   HISTORY  Chief Complaint Dizziness   History limited by: Not Limited   HPI Jordan [name] is a 31 y.o. female who presents to the emergency department today with primary complaint of feeling poorly. The patient states she has not felt well for the past 2 days. It started with abdominal pain. This was located in the left upper abdomen. She then started feeling weak. She has had associated nausea and vomiting. She denies any blood in the vomit. She has felt hot. The symptoms have been getting progressively worse.    No past medical history on file.  There are no active problems to display for this patient.   No past surgical history on file.  Prior to Admission medications   Medication Sig Start Date End Date Taking? Authorizing Provider  oxyCODONE-acetaminophen (ROXICET) 5-325 MG tablet Take 1-2 tablets by mouth every 4 (four) hours as needed for severe pain. 01/03/16   Beers, Charmayne Sheer, PA-C  sulfamethoxazole-trimethoprim (BACTRIM DS,SEPTRA DS) 800-160 MG tablet Take 1 tablet by mouth 2 (two) times daily. 01/03/16   Beers, Charmayne Sheer, PA-C    Allergies Patient has no known allergies.  No family history on file.  Social History Social History  Substance Use Topics  . Smoking status: Never Smoker  . Smokeless tobacco: Never Used  . Alcohol use No    Review of Systems Constitutional: Positive for fever and chills. Eyes: No visual changes. ENT: No sore throat. Cardiovascular: Denies chest pain. Respiratory: Denies shortness of breath. Gastrointestinal: Positive for abdominal pain and n/v Genitourinary: Negative for dysuria. Musculoskeletal: Negative for back pain. Skin: Negative for rash. Neurological: Positive for dizziness.    ____________________________________________   PHYSICAL EXAM:  VITAL SIGNS: ED Triage Vitals  Enc Vitals Group     BP 12/12/16 1813 118/79     Pulse Rate 12/12/16 1813 (!) 122     Resp 12/12/16 1813 18     Temp 12/12/16 1813 (!) 100.9 F (38.3 C)     Temp Source 12/12/16 1813 Oral     SpO2 12/12/16 1813 98 %     Weight 12/12/16 1814 143 lb (64.9 kg)     Height 12/12/16 1814 5\' 7"  (1.702 m)   Constitutional: Alert and oriented. Well appearing and in no distress. Eyes: Conjunctivae are normal.  ENT   Head: Normocephalic and atraumatic.   Nose: No congestion/rhinnorhea.   Mouth/Throat: Mucous membranes are moist.   Neck: No stridor. Hematological/Lymphatic/Immunilogical: No cervical lymphadenopathy. Cardiovascular: Tachyardic, regular rhythm.  No murmurs, rubs, or gallops.  Respiratory: Normal respiratory effort without tachypnea nor retractions. Breath sounds are clear and equal bilaterally. No wheezes/rales/rhonchi. Gastrointestinal: Soft and non tender. No rebound. No guarding.  Genitourinary: Deferred Musculoskeletal: Normal range of motion in all extremities. No lower extremity edema. Neurologic:  Normal speech and language. No gross focal neurologic deficits are appreciated.  Skin:  Skin is warm, dry and intact. No rash noted. Psychiatric: Mood and affect are normal. Speech and behavior are normal. Patient exhibits appropriate insight and judgment.  ____________________________________________    LABS (pertinent positives/negatives)  Labs Reviewed  URINALYSIS, COMPLETE (UACMP) WITH MICROSCOPIC - Abnormal; Notable for the following:       Result Value   Color, Urine YELLOW (*)    APPearance CLOUDY (*)    Hgb urine dipstick SMALL (*)    Ketones, ur 80 (*)  Squamous Epithelial / LPF 6-30 (*)    All other components within normal limits  BASIC METABOLIC PANEL  CBC  LACTIC ACID, PLASMA  POC URINE PREG, ED  POCT PREGNANCY, URINE      ____________________________________________   EKG  I, Phineas SemenGraydon Kayliana Codd, attending physician, personally viewed and interpreted this EKG  EKG Time: 1813 Rate: 117 Rhythm: sinus tachycardia Axis: normal Intervals: qtc 418 QRS: narrow ST changes: no st elevation Impression: abnormal ekg   ____________________________________________    RADIOLOGY  Abd acute/chest IMPRESSION: 1. No acute cardiopulmonary process. 2. No Acute abdominal process identified  ____________________________________________   PROCEDURES  Procedures  ____________________________________________   INITIAL IMPRESSION / ASSESSMENT AND PLAN / ED COURSE  Pertinent labs & imaging results that were available during my care of the patient were reviewed by me and considered in my medical decision making (see chart for details).  Patient presented to the emergency department today with signs and symptoms consistent with febrile illness. Patient's blood work here did not show any elevated lactic acidosis or leukocytosis. Patient did feel much improved after IV fluids. At this point think most likely viral illness. Discussed return precautions with patient.   ____________________________________________   FINAL CLINICAL IMPRESSION(S) / ED DIAGNOSES  Final diagnoses:  Fever, unspecified fever cause  Dehydration     Note: This dictation was prepared with Dragon dictation. Any transcriptional errors that result from this process are unintentional     Phineas SemenGoodman, Kanani Mowbray, MD 12/12/16 2101

## 2016-12-12 NOTE — ED Notes (Signed)
Pt. Requested apple juice and crackers.  Pt. Given AJ and crackers.

## 2016-12-12 NOTE — ED Triage Notes (Addendum)
Patient states "I am lightheaded, nauseas, chills, fevers, and been tired X2 days" I have no appetite. Unsure of highest fever at home. C/o lower abdomen pain yesterday. Last BM yesterday, but reports very little

## 2017-08-02 ENCOUNTER — Ambulatory Visit: Payer: Self-pay | Admitting: Obstetrics & Gynecology

## 2018-03-02 ENCOUNTER — Ambulatory Visit
Admission: EM | Admit: 2018-03-02 | Discharge: 2018-03-02 | Disposition: A | Discharge status: Home / Self Care | Payer: Managed Care, Other (non HMO)

## 2018-03-07 ENCOUNTER — Encounter: Payer: Self-pay | Admitting: Emergency Medicine

## 2018-03-07 ENCOUNTER — Other Ambulatory Visit: Payer: Self-pay

## 2018-03-07 ENCOUNTER — Ambulatory Visit
Admission: EM | Admit: 2018-03-07 | Discharge: 2018-03-07 | Disposition: A | Discharge status: Home / Self Care | Payer: Managed Care, Other (non HMO) | Attending: Emergency Medicine | Admitting: Emergency Medicine

## 2018-03-07 DIAGNOSIS — N760 Acute vaginitis: Secondary | ICD-10-CM

## 2018-03-07 DIAGNOSIS — B9689 Other specified bacterial agents as the cause of diseases classified elsewhere: Secondary | ICD-10-CM

## 2018-03-07 LAB — URINALYSIS, COMPLETE (UACMP) WITH MICROSCOPIC
Bilirubin Urine: NEGATIVE
Glucose, UA: NEGATIVE mg/dL
NITRITE: NEGATIVE
PH: 7 (ref 5.0–8.0)
Specific Gravity, Urine: 1.02 (ref 1.005–1.030)

## 2018-03-07 LAB — WET PREP, GENITAL
Sperm: NONE SEEN
TRICH WET PREP: NONE SEEN
YEAST WET PREP: NONE SEEN

## 2018-03-07 LAB — CHLAMYDIA/NGC RT PCR (ARMC ONLY)
Chlamydia Tr: NOT DETECTED
N gonorrhoeae: NOT DETECTED

## 2018-03-07 LAB — PREGNANCY, URINE: PREG TEST UR: NEGATIVE

## 2018-03-07 MED ORDER — METRONIDAZOLE 0.75 % VA GEL: VAGINAL | 0 refills | Status: DC

## 2018-03-07 NOTE — ED Provider Notes (Signed)
HPI  SUBJECTIVE:  Jordan [name] is a 32 y.o. female who presents with days of odorous vaginal discharge that is identical to the BV that she had last month.  She reports 3 days of dysuria, urgency, frequency.  She denies cloudy odorous urine, hematuria.  No genital rash, blisters, itching, vulvar swelling.  No fevers.  She reports intermittent periumbilical abdominal pain that lasts a second or 2 and then resolves.  No back or pelvic pain.  She is in a long-term 4-year monogamous relationship with a female who is asymptomatic.  STDs are not a concern today.  No recent antibiotics.  She is using a new perfumed soap/body wash.  She tried Monistat with temporary improvement in her symptoms, but states that they return.  No aggravating factors.  She has a past medical history of UTI, remote history of chlamydia, BV, yeast infections.  No history of pyelonephritis, nephrolithiasis, diabetes, gonorrhea, HIV, HSV, syphilis, Trichomonas.  LMP: October.  She is 1 week of late.  PMD: None.  no insurance until December.  Does not have a specific practice with which she can follow-up.    History reviewed. No pertinent past medical history.  History reviewed. No pertinent surgical history.  Family History  Problem Relation Age of Onset  . Healthy Mother   . Healthy Father     Social History   Tobacco Use  . Smoking status: Never Smoker  . Smokeless tobacco: Never Used  Substance Use Topics  . Alcohol use: No  . Drug use: No    No current facility-administered medications for this encounter.   Current Outpatient Medications:  .  metroNIDAZOLE (METROGEL) 0.75 % vaginal gel, 1 applicator full pv bid x 5 days, Disp: 70 g, Rfl: 0  No Known Allergies   ROS  As noted in HPI.   Physical Exam  BP (!) 125/95 (BP Location: Left Arm)   Pulse 83   Temp 98.2 F (36.8 C) (Oral)   Resp 16   Ht 5\' 7"  (1.702 m)   Wt 77.1 kg   LMP 01/28/2018 (Approximate)   SpO2 100%   BMI 26.63 kg/m    Constitutional: Well developed, well nourished, no acute distress Eyes:  EOMI, conjunctiva normal bilaterally HENT: Normocephalic, atraumatic,mucus membranes moist Respiratory: Normal inspiratory effort Cardiovascular: Normal rate GI: nondistended.  Soft.  No suprapubic, flank tenderness Back: No CVAT GU: Deferred skin: No rash, skin intact Musculoskeletal: no deformities Neurologic: Alert & oriented x 3, no focal neuro deficits Psychiatric: Speech and behavior appropriate   ED Course   Medications - No data to display  Orders Placed This Encounter  Procedures  . Wet prep, genital    Standing Status:   Standing    Number of Occurrences:   1    Order Specific Question:   Patient immune status    Answer:   Normal  . Chlamydia/NGC rt PCR    Standing Status:   Standing    Number of Occurrences:   1    Order Specific Question:   Patient immune status    Answer:   Normal  . Urinalysis, Complete w Microscopic    Standing Status:   Standing    Number of Occurrences:   1  . Pregnancy, urine    Standing Status:   Standing    Number of Occurrences:   1    Results for orders placed or performed during the hospital encounter of 03/07/18 (from the past 24 hour(s))  Urinalysis, Complete w Microscopic  Status: Abnormal   Collection Time: 03/07/18  5:19 PM  Result Value Ref Range   Color, Urine YELLOW YELLOW   APPearance CLOUDY (A) CLEAR   Specific Gravity, Urine 1.020 1.005 - 1.030   pH 7.0 5.0 - 8.0   Glucose, UA NEGATIVE NEGATIVE mg/dL   Hgb urine dipstick TRACE (A) NEGATIVE   Bilirubin Urine NEGATIVE NEGATIVE   Ketones, ur TRACE (A) NEGATIVE mg/dL   Protein, ur TRACE (A) NEGATIVE mg/dL   Nitrite NEGATIVE NEGATIVE   Leukocytes, UA TRACE (A) NEGATIVE   Squamous Epithelial / LPF 11-20 0 - 5   WBC, UA 21-50 0 - 5 WBC/hpf   RBC / HPF 0-5 0 - 5 RBC/hpf   Bacteria, UA RARE (A) NONE SEEN  Pregnancy, urine     Status: None   Collection Time: 03/07/18  5:19 PM  Result  Value Ref Range   Preg Test, Ur NEGATIVE NEGATIVE  Wet prep, genital     Status: Abnormal   Collection Time: 03/07/18  6:12 PM  Result Value Ref Range   Yeast Wet Prep HPF POC NONE SEEN NONE SEEN   Trich, Wet Prep NONE SEEN NONE SEEN   Clue Cells Wet Prep HPF POC PRESENT (A) NONE SEEN   WBC, Wet Prep HPF POC FEW (A) NONE SEEN   Sperm NONE SEEN    No results found.  ED Clinical Impression  BV (bacterial vaginosis)   ED Assessment/Plan  Urine pregnancy negative.  UA is not entirely suggestive of a UTI as she gave [place] a contaminated sample.  Given that she had odorous vaginal discharge preceding her urinary symptoms, I suspect that her symptoms are coming from a gynecologic infection rather than a UTI.  will not send this urine off for culture. will send gonorrhea and chlamydia, but the patient has decided to defer treatment until her labs are resulted.    She declined HIV and syphilis testing.   Feel that this is reasonable, I think that she is low risk.Told her that we will call her for labs come back abnormal, or she may call here in several days to get her results.  Wet prep positive for BV, negative for yeast, trichomonas.  She requests vaginal inserts that she states that she is "not good with taking pills."  Will send home with metronidazole 0.75% 1 applicator per vagina nightly for 5 days.  Will give her primary care referral list as well.   Discussed labs, MDM, treatment plan, and plan for follow-up with patient.patient agrees with plan.   Meds ordered this encounter  Medications  . metroNIDAZOLE (METROGEL) 0.75 % vaginal gel    Sig: 1 applicator full pv bid x 5 days    Dispense:  70 g    Refill:  0    *This clinic note was created using Scientist, clinical (histocompatibility and immunogenetics). Therefore, there may be occasional mistakes despite careful proofreading.   ?   Domenick Gong, MD 03/07/18 1941

## 2018-03-07 NOTE — ED Triage Notes (Signed)
Patient c/o burning when urinating and increase in urinary frequency for the past 3 days.

## 2018-03-07 NOTE — Discharge Instructions (Addendum)
Your wet prep was negative for yeast, trichomonas, positive for BV.   I am sending you home with metronidazole vaginal cream which should take care of the problem.  We will call you if any of your other labs come back abnormal, if you do not hear from [place] and want to make sure that everything was negative, you can always call here and get your results in several days.  Follow-up with a primary care physician of your choice, see list below.  Here is a list of primary care providers who are taking new patients:  Dr. Elizabeth Sauer, Dr. Schuyler Amor [address] [place] [address] [phone]  [facility] [address] [place] [address]  [phone]  [facility] [address] [place], [place] [address] [phone]  [facility] [address] [place]  [phone] [place], [place] [address]  Here are clinics/ other resources who will see you if you do not have insurance. Some have certain criteria that you must meet. Call them and find out what they are:  [facility]: [address], [place], [place] [address] Phone: [phone] Hours: First and Third Saturdays of each Month, 9 a.m. - 1 p.m.  [facility]: [address], [address] [place], [place] [address] Phone: [phone] Hours: Tuesday, 4 p.m. - 8 p.m. Thursday, 1 p.m. - 8 p.m. Wednesday, 9 a.m. - [facility] [address] [place], [place] [address] Phone: [phone] Pharmacy Phone Number: [phone] Dental Phone Number: [phone] [facility] Insurance Help: [phone]  Dental Hours: Monday - Thursday, 8 a.m. - 6 p.m.  [facility] [address], [place], [place] [address] Phone: [phone] Pharmacy Phone Number: [phone] [facility] Insurance Help: [phone]  [facility] [address] [place] [place], [place] [address] Phone: [phone] Pharmacy Phone Number: [phone] [facility] [place]  Insurance Help: [phone]  [facility] [address] [place], [place] [address] Phone: [phone] [facility] Insurance Help: [phone]   [facility] [address] [place], [place] [address] Phone: [phone]  Go to www.goodrx.com to look up your medications. This will give you a list of where you can find your prescriptions at the most affordable prices. Or ask the pharmacist what the cash price is, or if they have any other discount programs available to help make your medication more affordable. This can be less expensive than what you would pay with insurance.

## 2018-04-04 ENCOUNTER — Telehealth: Payer: Self-pay | Admitting: Emergency Medicine

## 2018-04-04 NOTE — Telephone Encounter (Signed)
Patient called in today requesting treatment for Diflucan. Patient states that provider had told her if she needed something to call back. Patient was seen 03/07/18 for BV and at that time there was no yeast seen. Patient was treated with Metrogel, which she didn't pick up until 03/31/18. Patient states she had to wait until her health insurance went in to affect. Patient states she is still having a discharge. Patient will finish the Metrogel tonight. Spoke with Dr. Judd Gaudieronty and he advised that patient would need to be seen for re-evaluation. Patient notified and voiced understanding and stated she would go see her PCP.

## 2018-06-13 ENCOUNTER — Other Ambulatory Visit (HOSPITAL_COMMUNITY)
Admission: RE | Admit: 2018-06-13 | Discharge: 2018-06-13 | Disposition: A | Discharge status: Home / Self Care | Payer: Managed Care, Other (non HMO) | Source: Ambulatory Visit | Attending: Physician Assistant | Admitting: Physician Assistant

## 2018-06-13 ENCOUNTER — Encounter: Payer: Self-pay | Admitting: Physician Assistant

## 2018-06-13 ENCOUNTER — Ambulatory Visit (INDEPENDENT_AMBULATORY_CARE_PROVIDER_SITE_OTHER): Payer: Managed Care, Other (non HMO) | Admitting: Physician Assistant

## 2018-06-13 VITALS — BP 128/88 | HR 85 | Temp 98.3°F | Resp 16 | Ht 67.0 in | Wt 175.0 lb

## 2018-06-13 DIAGNOSIS — Z130 Encounter for screening for diseases of the blood and blood-forming organs and certain disorders involving the immune mechanism: Secondary | ICD-10-CM

## 2018-06-13 DIAGNOSIS — Z23 Encounter for immunization: Secondary | ICD-10-CM | POA: Diagnosis not present

## 2018-06-13 DIAGNOSIS — Z124 Encounter for screening for malignant neoplasm of cervix: Secondary | ICD-10-CM | POA: Diagnosis not present

## 2018-06-13 DIAGNOSIS — Z0000 Encounter for general adult medical examination without abnormal findings: Secondary | ICD-10-CM

## 2018-06-13 DIAGNOSIS — N898 Other specified noninflammatory disorders of vagina: Secondary | ICD-10-CM

## 2018-06-13 DIAGNOSIS — Z114 Encounter for screening for human immunodeficiency virus [HIV]: Secondary | ICD-10-CM

## 2018-06-13 DIAGNOSIS — Z319 Encounter for procreative management, unspecified: Secondary | ICD-10-CM | POA: Diagnosis not present

## 2018-06-13 DIAGNOSIS — N912 Amenorrhea, unspecified: Secondary | ICD-10-CM

## 2018-06-13 DIAGNOSIS — Z131 Encounter for screening for diabetes mellitus: Secondary | ICD-10-CM

## 2018-06-13 DIAGNOSIS — A630 Anogenital (venereal) warts: Secondary | ICD-10-CM | POA: Diagnosis not present

## 2018-06-13 DIAGNOSIS — N949 Unspecified condition associated with female genital organs and menstrual cycle: Secondary | ICD-10-CM

## 2018-06-13 DIAGNOSIS — Z13220 Encounter for screening for lipoid disorders: Secondary | ICD-10-CM

## 2018-06-13 NOTE — Progress Notes (Signed)
Patient: Jordan [name], Female    DOB: Sep 27, 1985, 33 y.o.   MRN: [id number] Visit Date: 06/13/2018  Today's Provider: Trey SailorsAdriana M Sukari Grist, PA-C   Chief Complaint  Patient presents with  . New Patient (Initial Visit)   Subjective:     Annual physical exam  Jordan [name] is a 33 y.o. female who presents today for health maintenance and complete physical. She feels well. She reports exercising none. She reports she is sleeping well. Patient here to establish care. Patient reports she has not had a PCP in several years. Patient reports she has been seen at [facility]gent Care and [facility]. Patient reports possible pap in last few years, not sure of date.   Patient reports she is having abnormal vaginal discharge and has been treated for BV in the past at Urgent Care. Patient would like to have vaginal exam today. She reports previously she was diagnosed with genital warts. She also was worried about Herpes because at some point her partner had it.   She also wants to know whom she can see about infertility. Reports she has had unprotected sex x 4 years without pregnancy. She reports that she has never been pregnant. Her current partner does not have children.  -----------------------------------------------------------------   Review of Systems  Constitutional: Positive for diaphoresis and unexpected weight change.  HENT: Negative.   Eyes: Negative.   Respiratory: Negative.   Cardiovascular: Negative.   Gastrointestinal: Negative.   Endocrine: Negative.   Genitourinary: Positive for genital sores and vaginal discharge.  Musculoskeletal: Negative.   Skin: Negative.   Allergic/Immunologic: Negative.   Neurological: Negative.   Hematological: Negative.   Psychiatric/Behavioral: Negative.     Social History      She  reports that she has never smoked. She has never used smokeless tobacco. She reports that she does not drink alcohol or use drugs.       Social  History   Socioeconomic History  . Marital status: Single    Spouse name: Not on file  . Number of children: Not on file  . Years of education: Not on file  . Highest education level: Not on file  Occupational History  . Not on file  Social Needs  . Financial resource strain: Not on file  . Food insecurity:    Worry: Not on file    Inability: Not on file  . Transportation needs:    Medical: Not on file    Non-medical: Not on file  Tobacco Use  . Smoking status: Never Smoker  . Smokeless tobacco: Never Used  Substance and Sexual Activity  . Alcohol use: No  . Drug use: No  . Sexual activity: Yes    Birth control/protection: None  Lifestyle  . Physical activity:    Days per week: Not on file    Minutes per session: Not on file  . Stress: Not on file  Relationships  . Social connections:    Talks on phone: Not on file    Gets together: Not on file    Attends religious service: Not on file    Active member of club or organization: Not on file    Attends meetings of clubs or organizations: Not on file    Relationship status: Not on file  Other Topics Concern  . Not on file  Social History Narrative  . Not on file    Past Medical History:  Diagnosis Date  . Blood transfusion without reported diagnosis   .  Seizures (HCC)      There are no active problems to display for this patient.   No past surgical history on file.  Family History        Family Status  Relation Name Status  . Mother  Alive  . Father  Alive        Her family history includes Healthy in her father and mother; Migraines in her mother.      No Known Allergies  No current outpatient medications on file.   Patient Care Team: Jordan [name] as PCP - General (Physician Assistant)    Objective:    Vitals: BP 128/88 (BP Location: Left Arm, Patient Position: Sitting, Cuff Size: Normal)   Pulse 85   Temp 98.3 F (36.8 C) (Oral)   Resp 16   Ht 5\' 7"  (1.702 m)   Wt 175 lb  (79.4 kg)   BMI 27.41 kg/m    Vitals:   06/13/18 1417  BP: 128/88  Pulse: 85  Resp: 16  Temp: 98.3 F (36.8 C)  TempSrc: Oral  Weight: 175 lb (79.4 kg)  Height: 5\' 7"  (1.702 m)     Physical Exam Constitutional:      Appearance: Normal appearance.  HENT:     Right Ear: Tympanic membrane and ear canal normal.     Left Ear: Tympanic membrane and ear canal normal.  Neck:     Musculoskeletal: Neck supple.  Cardiovascular:     Rate and Rhythm: Normal rate and regular rhythm.     Heart sounds: Normal heart sounds.  Pulmonary:     Effort: Pulmonary effort is normal.     Breath sounds: Normal breath sounds.  Abdominal:     General: Abdomen is flat. Bowel sounds are normal.     Palpations: Abdomen is soft.  Genitourinary:    Exam position: Prone.     Labia:        Right: No rash, tenderness, lesion or injury.        Left: Lesion present. No rash, tenderness or injury.      Cervix: Normal.     Uterus: Absent.      Adnexa: Right adnexa normal and left adnexa normal.    Neurological:     General: No focal deficit present.     Mental Status: She is alert and oriented to person, place, and time.  Psychiatric:        Mood and Affect: Mood normal.        Behavior: Behavior normal.      Depression Screen PHQ 2/9 Scores 06/13/2018  PHQ - 2 Score 0  PHQ- 9 Score 6       Assessment & Plan:     Routine Health Maintenance and Physical Exam  Exercise Activities and Dietary recommendations Goals   None     Immunization History  Administered Date(s) Administered  . Tdap 06/13/2018    Health Maintenance  Topic Date Due  . TETANUS/TDAP  10/24/2006  . PAP SMEAR-Modifier  10/23/2008  . INFLUENZA VACCINE  11/28/2017  . HIV Screening  Completed     Discussed health benefits of physical activity, and encouraged her to engage in regular exercise appropriate for her age and condition.    1. Annual physical exam   2. Cervical cancer screening  PAP as below.    - Cytology - PAP  3. Genital warts  Two visible genital warts lesions, will refer to gynecology for removal as well as further workup of infertility.  -  Ambulatory referral to Obstetrics / Gynecology  4. Infertility management  - Ambulatory referral to Obstetrics / Gynecology  5. Vaginal discharge  - Cervicovaginal ancillary only  6. Amenorrhea  - TSH - Beta HCG, Quant  7. Lipid screening  - Lipid Profile  8. Encounter for screening for HIV  - HIV antibody (with reflex)  9. Screening for deficiency anemia  - CBC with Differential  10. Diabetes mellitus screening  - Comprehensive Metabolic Panel (CMET)  11. Genital lesion, female  - Herpes simplex virus culture  12. Need for Tdap vaccination  - Tdap vaccine greater than or equal to 7yo IM  The entirety of the information documented in the History of Present Illness, Review of Systems and Physical Exam were personally obtained by me. Portions of this information were initially documented by Rondel Baton, CMA and reviewed by me for thoroughness and accuracy.   Return in about 1 year (around 06/14/2019) for CPE.  --------------------------------------------------------------------    Jordan Sailors, PA-C  [facility]

## 2018-06-13 NOTE — Patient Instructions (Signed)
Female Infertility    Female infertility refers to a woman's inability to get pregnant (conceive) after a year of having sex regularly (or after 6 months in women over age 31) without using birth control. Infertility can also mean that a woman is not able to carry a pregnancy to full term.  Both women and men can have fertility problems.  What are the causes?  This condition may be caused by:  Problems with reproductive organs. Infertility can result if a woman:  Has an abnormally short cervix or a cervix that does not remain closed during a pregnancy.  Has a blockage or scarring in the fallopian tubes.  Has an abnormally shaped uterus.  Has uterine fibroids. This is a benign mass of tissue or muscle (tumor) that can develop in the uterus.  Is not ovulating in a regular way.  Certain medical conditions. These may include:  Polycystic ovary syndrome (PCOS). This is a hormonal disorder that can cause small cysts to grow on the ovaries. This is the most common cause of infertility in women.  Endometriosis. This is a condition in which the tissue that lines the uterus (endometrium) grows outside of its normal location.  Cancer and cancer treatments, such as chemotherapy or radiation.  Premature ovarian failure. This is when ovaries stop producing eggs and hormones before age 40.  Sexually transmitted diseases, such as chlamydia or gonorrhea.  Autoimmune disorders. These are disorders in which the body's defense system (immune system) attacks normal, healthy cells.  Infertility can be linked to more than one cause. For some women, the cause of infertility is not known (unexplained infertility).  What increases the risk?  Age. A woman's fertility declines with age, especially after her mid-30s.  Being underweight or overweight.  Drinking too much alcohol.  Using drugs such as anabolic steroids, cocaine, and marijuana.  Exercising excessively.  Being exposed to environmental toxins, such as radiation, pesticides, and  certain chemicals.  What are the signs or symptoms?  The main sign of infertility in women is the inability to get pregnant or carry a pregnancy to full term.  How is this diagnosed?  This condition may be diagnosed by:  Checking whether you are ovulating each month. The tests may include:  Blood tests to check hormone levels.  An ultrasound of the ovaries.  Taking a small tissue that lines the uterus and checking it under a microscope (endometrial biopsy).  Doing additional tests. This is done if ovulation is normal. Tests may include:  Hysterosalpingography. This X-ray test can show the shape of the uterus and whether the fallopian tubes are open.  Laparoscopy. This test uses a lighted tube (laparoscope) to look for problems in the fallopian tubes and other organs.  Transvaginal ultrasound. This imaging test is used to check for abnormalities in the uterus and ovaries.  Hysteroscopy. This test uses a lighted tube to check for problems in the cervix and the uterus.  To be diagnosed with infertility, both partners will have a physical exam. Both partners will also have an extensive medical and sexual history taken. Additional tests may be done.  How is this treated?  Treatment depends on the cause of infertility. Most cases of infertility in women are treated with medicine or surgery.  Women may take medicine to:  Correct ovulation problems.  Treat other health conditions.  Surgery may be done to:  Repair damage to the ovaries, fallopian tubes, cervix, or uterus.  Remove growths from the uterus.  Remove scar tissue   from the uterus, pelvis, or other organs.  Assisted reproductive technology (ART)  Assisted reproductive technology (ART) refers to all treatments and procedures that combine eggs and sperm outside the body to try to help a couple conceive. ART is often combined with fertility drugs to stimulate ovulation. Sometimes ART is done using eggs retrieved from another woman's body (donor eggs) or from previously  frozen fertilized eggs (embryos).  There are different types of ART. These include:  Intrauterine insemination (IUI). A long, thin tube is used to place sperm directly into a woman's uterus. This procedure:  Is effective for infertility caused by sperm problems, including low sperm count and low motility.  Can be used in combination with fertility drugs.  In vitro fertilization (IVF). This is done when a woman's fallopian tubes are blocked or when a man has low sperm count. In this procedure:  Fertility drugs are used to stimulate the ovaries to produce multiple eggs.  Once mature, these eggs are removed from the body and combined with the sperm to be fertilized.  The fertilized eggs are then placed into the woman's uterus.  Follow these instructions at home:  Take over-the-counter and prescription medicines only as told by your health care provider.  Do not use any products that contain nicotine or tobacco, such as cigarettes and e-cigarettes. If you need help quitting, ask your health care provider.  If you drink alcohol, limit how much you have to 1 drink a day.  Make dietary changes to lose weight or maintain a healthy weight. Work with your health care provider and a dietitian to set a weight-loss goal that is healthy and reasonable for you.  Seek support from a counselor or support group to talk about your concerns related to infertility. Couples counseling may be helpful for you and your partner.  Practice stress reduction techniques that work well for you, such as regular physical activity, meditation, or deep breathing.  Keep all follow-up visits as told by your health care provider. This is important.  Contact a health care provider if you:  Feel that stress is interfering with your life and relationships.  Have side effects from treatments for infertility.  Summary  Female infertility refers to a woman's inability to get pregnant (conceive) after a year of having sex regularly (or after 6 months in women  over age 31) without using birth control.  To be diagnosed with infertility, both partners will have a physical exam. Both partners will also have an extensive medical and sexual history taken.  Seek support from a counselor or support group to talk about your concerns related to infertility. Couples counseling may be helpful for you and your partner.  This information is not intended to replace advice given to you by your health care provider. Make sure you discuss any questions you have with your health care provider.  Document Released: 04/19/2003 Document Revised: 03/18/2017 Document Reviewed: 03/18/2017  Elsevier Interactive Patient Education © 2019 [employer] Inc[employer]  •

## 2018-06-14 LAB — TSH: TSH: 1.23 u[IU]/mL (ref 0.450–4.500)

## 2018-06-14 LAB — LIPID PANEL
Chol/HDL Ratio: 3.2 ratio (ref 0.0–4.4)
Cholesterol, Total: 190 mg/dL (ref 100–199)
HDL: 59 mg/dL (ref >39)
LDL Calculated: 112 mg/dL — ABNORMAL HIGH (ref 0–99)
Triglycerides: 94 mg/dL (ref 0–149)
VLDL Cholesterol Cal: 19 mg/dL (ref 5–40)

## 2018-06-14 LAB — CBC WITH DIFFERENTIAL/PLATELET
Basophils Absolute: 0 10*3/uL (ref 0.0–0.2)
Basos: 1 %
EOS (ABSOLUTE): 0.1 10*3/uL (ref 0.0–0.4)
Eos: 2 %
Hematocrit: 41.1 % (ref 34.0–46.6)
Hemoglobin: 14 g/dL (ref 11.1–15.9)
Immature Grans (Abs): 0 10*3/uL (ref 0.0–0.1)
Immature Granulocytes: 0 %
Lymphocytes Absolute: 1.7 10*3/uL (ref 0.7–3.1)
Lymphs: 41 %
MCH: 30.6 pg (ref 26.6–33.0)
MCHC: 34.1 g/dL (ref 31.5–35.7)
MCV: 90 fL (ref 79–97)
Monocytes Absolute: 0.5 10*3/uL (ref 0.1–0.9)
Monocytes: 12 %
Neutrophils Absolute: 1.8 10*3/uL (ref 1.4–7.0)
Neutrophils: 44 %
Platelets: 233 10*3/uL (ref 150–450)
RBC: 4.57 x10E6/uL (ref 3.77–5.28)
RDW: 11.9 % (ref 11.7–15.4)
WBC: 4 10*3/uL (ref 3.4–10.8)

## 2018-06-14 LAB — COMPREHENSIVE METABOLIC PANEL
ALT: 23 IU/L (ref 0–32)
AST: 19 IU/L (ref 0–40)
Albumin/Globulin Ratio: 1.5 (ref 1.2–2.2)
Albumin: 4.4 g/dL (ref 3.9–5.0)
Alkaline Phosphatase: 69 IU/L (ref 39–117)
BUN/Creatinine Ratio: 8 — ABNORMAL LOW (ref 9–23)
BUN: 7 mg/dL (ref 6–20)
Bilirubin Total: 0.6 mg/dL (ref 0.0–1.2)
CO2: 25 mmol/L (ref 20–29)
Calcium: 9.8 mg/dL (ref 8.7–10.2)
Chloride: 102 mmol/L (ref 96–106)
Creatinine, Ser: 0.87 mg/dL (ref 0.57–1.00)
GFR calc Af Amer: 103 mL/min/{1.73_m2} (ref >59)
GFR calc non Af Amer: 90 mL/min/{1.73_m2} (ref >59)
Globulin, Total: 2.9 g/dL (ref 1.5–4.5)
Glucose: 72 mg/dL (ref 65–99)
Potassium: 3.9 mmol/L (ref 3.5–5.2)
Sodium: 143 mmol/L (ref 134–144)
Total Protein: 7.3 g/dL (ref 6.0–8.5)

## 2018-06-14 LAB — BETA HCG QUANT (REF LAB): hCG Quant: <1 m[IU]/mL

## 2018-06-14 LAB — HIV ANTIBODY (ROUTINE TESTING W REFLEX): HIV Screen 4th Generation wRfx: NONREACTIVE

## 2018-06-16 ENCOUNTER — Telehealth: Payer: Self-pay | Admitting: Physician Assistant

## 2018-06-16 LAB — HERPES SIMPLEX VIRUS CULTURE

## 2018-06-16 LAB — SPECIMEN STATUS REPORT

## 2018-06-16 NOTE — Telephone Encounter (Signed)
Pt needing a call  once labs are released.  Please advise.  Thanks, [employer]

## 2018-06-17 ENCOUNTER — Telehealth: Payer: Self-pay

## 2018-06-17 DIAGNOSIS — B373 Candidiasis of vulva and vagina: Secondary | ICD-10-CM

## 2018-06-17 DIAGNOSIS — B3731 Acute candidiasis of vulva and vagina: Secondary | ICD-10-CM

## 2018-06-17 DIAGNOSIS — N760 Acute vaginitis: Principal | ICD-10-CM

## 2018-06-17 DIAGNOSIS — B9689 Other specified bacterial agents as the cause of diseases classified elsewhere: Secondary | ICD-10-CM

## 2018-06-17 LAB — CYTOLOGY - PAP
Diagnosis: NEGATIVE
HPV: NOT DETECTED

## 2018-06-17 LAB — CERVICOVAGINAL ANCILLARY ONLY
Bacterial vaginitis: POSITIVE — AB
Candida vaginitis: NEGATIVE
Chlamydia: NEGATIVE
Neisseria Gonorrhea: NEGATIVE
Trichomonas: NEGATIVE

## 2018-06-17 NOTE — Telephone Encounter (Signed)
-----   Message from Trey Sailors, [place] sent at 06/17/2018  8:23 AM EST ----- Pregnancy negative, HIV negative. HSV negative. Vaginal swab and pap pending.

## 2018-06-17 NOTE — Telephone Encounter (Signed)
-----   Message from Trey Sailors, [place] sent at 06/17/2018  5:00 PM EST ----- PAP smear is normal with negative HPV. She is positive for bacterial vaginosis. I can send her oral or vaginal metronidazole. She cannot use alcohol with this medication.

## 2018-06-17 NOTE — Telephone Encounter (Signed)
Please see result note 

## 2018-06-17 NOTE — Telephone Encounter (Signed)
Could not leave a vm due to patient not having a vm upset.

## 2018-06-17 NOTE — Telephone Encounter (Signed)
Patient advised as below. Patient requesting capsule form antibiotic if needed for BV.

## 2018-06-17 NOTE — Telephone Encounter (Signed)
[email or web address]

## 2018-06-18 MED ORDER — METRONIDAZOLE 500 MG PO TABS: 500.0000 mg | ORAL_TABLET | Freq: Two times a day (BID) | ORAL | 0 refills | Status: DC

## 2018-06-18 NOTE — Telephone Encounter (Signed)
Pt returned missed call. Please call pt back to discuss results.  Thanks, [employer]

## 2018-06-18 NOTE — Telephone Encounter (Signed)
Patient advised as directed below. Patient wants oral tablets for the Metronidazole and is asking if Diflucan can be send in for her also.  Pharmacy: [facility]

## 2018-06-19 MED ORDER — FLUCONAZOLE 150 MG PO TABS: 150.0000 mg | ORAL_TABLET | Freq: Once | ORAL | 0 refills | Status: DC

## 2018-06-19 MED ORDER — METRONIDAZOLE 500 MG PO TABS: 500.0000 mg | ORAL_TABLET | Freq: Two times a day (BID) | ORAL | 0 refills | Status: AC

## 2018-06-19 MED ORDER — FLUCONAZOLE 150 MG PO TABS: 150.0000 mg | ORAL_TABLET | Freq: Once | ORAL | 0 refills | Status: AC

## 2018-06-19 NOTE — Telephone Encounter (Signed)
Should not take diflucan until a few days after metronidazole.

## 2018-06-19 NOTE — Telephone Encounter (Signed)
Patient advised as below. Resent medication to [facility] on [address].

## 2018-06-27 ENCOUNTER — Encounter: Payer: Managed Care, Other (non HMO) | Admitting: Certified Nurse Midwife

## 2018-06-27 ENCOUNTER — Telehealth: Payer: Self-pay | Admitting: Physician Assistant

## 2018-06-27 NOTE — Telephone Encounter (Signed)
I cannot diagnose based on odor. She would have to be seen. She has an appointment today with OBGYN who should be able to do a pelvic exam and swab.

## 2018-06-27 NOTE — Telephone Encounter (Signed)
Patient advised as below. Patient advised to call Monday if symptoms persist.

## 2018-06-27 NOTE — Telephone Encounter (Signed)
Pt starting taking Metronidazole on Monday.  Pt is now having an musty odor. Please call pt back to discuss asap.  Thanks, [employer]

## 2018-07-02 ENCOUNTER — Telehealth: Payer: Self-pay | Admitting: Physician Assistant

## 2018-07-02 NOTE — Telephone Encounter (Signed)
Needs OV to be re-examined.

## 2018-07-02 NOTE — Telephone Encounter (Signed)
Pt called saying she was in about two weeks ago and has taken the mediation prescribed but she is still having symptoms.v  Pt did not  Give much information about what was going on with her symptoms  Please advise  Pt's CB# [phone]  Thanks Barth Kirks

## 2018-07-03 NOTE — Telephone Encounter (Signed)
[id number] 

## 2018-07-09 NOTE — Telephone Encounter (Signed)
Spoke to pt and she advised that we are not in network with her insurance.  I advised pt that she needs to be re-evaluated as soon as possible.  Pt advised that she would be checking with insurance on who she can be seen by.  Advised Adrianna about issues.  dbs

## 2018-07-15 ENCOUNTER — Ambulatory Visit: Payer: Managed Care, Other (non HMO) | Admitting: Physician Assistant

## 2018-08-31 ENCOUNTER — Other Ambulatory Visit: Payer: Self-pay

## 2018-08-31 ENCOUNTER — Emergency Department: Payer: Managed Care, Other (non HMO)

## 2018-08-31 ENCOUNTER — Emergency Department
Admission: EM | Admit: 2018-08-31 | Discharge: 2018-08-31 | Disposition: A | Discharge status: Home / Self Care | Payer: Managed Care, Other (non HMO) | Attending: Emergency Medicine | Admitting: Emergency Medicine

## 2018-08-31 DIAGNOSIS — R05 Cough: Secondary | ICD-10-CM | POA: Diagnosis not present

## 2018-08-31 DIAGNOSIS — R059 Cough, unspecified: Secondary | ICD-10-CM

## 2018-08-31 DIAGNOSIS — R042 Hemoptysis: Secondary | ICD-10-CM | POA: Diagnosis present

## 2018-08-31 DIAGNOSIS — R0602 Shortness of breath: Secondary | ICD-10-CM | POA: Diagnosis not present

## 2018-08-31 LAB — BASIC METABOLIC PANEL
Anion gap: 9 (ref 5–15)
BUN: 10 mg/dL (ref 6–20)
CO2: 27 mmol/L (ref 22–32)
Calcium: 8.7 mg/dL — ABNORMAL LOW (ref 8.9–10.3)
Chloride: 104 mmol/L (ref 98–111)
Creatinine, Ser: 0.77 mg/dL (ref 0.44–1.00)
GFR calc Af Amer: >60 mL/min (ref >60)
GFR calc non Af Amer: >60 mL/min (ref >60)
Glucose, Bld: 114 mg/dL — ABNORMAL HIGH (ref 70–99)
Potassium: 3.4 mmol/L — ABNORMAL LOW (ref 3.5–5.1)
Sodium: 140 mmol/L (ref 135–145)

## 2018-08-31 LAB — CBC
HCT: 44.6 % (ref 36.0–46.0)
Hemoglobin: 15.2 g/dL — ABNORMAL HIGH (ref 12.0–15.0)
MCH: 31.3 pg (ref 26.0–34.0)
MCHC: 34.1 g/dL (ref 30.0–36.0)
MCV: 91.8 fL (ref 80.0–100.0)
Platelets: 216 10*3/uL (ref 150–400)
RBC: 4.86 MIL/uL (ref 3.87–5.11)
RDW: 11.8 % (ref 11.5–15.5)
WBC: 3.4 10*3/uL — ABNORMAL LOW (ref 4.0–10.5)
nRBC: 0.6 % — ABNORMAL HIGH (ref 0.0–0.2)

## 2018-08-31 LAB — SAMPLE TO BLOOD BANK

## 2018-08-31 LAB — TROPONIN I: Troponin I: <0.03 ng/mL (ref <0.03)

## 2018-08-31 MED ORDER — ALBUTEROL SULFATE HFA 108 (90 BASE) MCG/ACT IN AERS: 2.0000 | INHALATION_SPRAY | Freq: Four times a day (QID) | RESPIRATORY_TRACT | 0 refills | Status: DC | PRN

## 2018-08-31 MED ORDER — SODIUM CHLORIDE 0.9% FLUSH: 3.0000 mL | Freq: Once | INTRAVENOUS | Status: DC

## 2018-08-31 MED ORDER — AZITHROMYCIN 250 MG PO TABS: ORAL_TABLET | ORAL | 0 refills | Status: AC

## 2018-08-31 NOTE — ED Notes (Signed)
Patient presents with known covid 19. Has complaints of hempotysis and shortness of breath.

## 2018-08-31 NOTE — ED Provider Notes (Signed)
[facility] Emergency Department Provider Note  ____________________________________________   I have reviewed the triage vital signs and the nursing notes.   HISTORY  Chief Complaint Hemoptysis   History limited by: Not Limited   HPI Jordan [name] is a 33 y.o. female who presents to the emergency department today because of concern for hemoptysis. The patient states that she has been feeling sick for the past week or so. She was tested for covid 19 today and it was positive. The patient states that she has been coughing. The phlegm has had some blood in it. She has also been short of breath and has had fevers chills and decreased appetite. She denies any history of lung disease or smoking.    Records reviewed. Per medical record review patient has a history of seizures.   Past Medical History:  Diagnosis Date  . Blood transfusion without reported diagnosis   . Seizures (HCC)     There are no active problems to display for this patient.   History reviewed. No pertinent surgical history.  Prior to Admission medications   Medication Sig Start Date End Date Taking? Authorizing Provider  albuterol (VENTOLIN HFA) 108 (90 Base) MCG/ACT inhaler Inhale 2 puffs into the lungs every 6 (six) hours as needed for wheezing or shortness of breath. 08/31/18   Phineas Semen, MD  azithromycin (ZITHROMAX Z-PAK) 250 MG tablet Take 2 tablets (500 mg) on  Day 1,  followed by 1 tablet (250 mg) once daily on Days 2 through 5. 08/31/18 09/05/18  Phineas Semen, MD    Allergies Patient has no known allergies.  Family History  Problem Relation Age of Onset  . Healthy Mother   . Migraines Mother   . Healthy Father     Social History Social History   Tobacco Use  . Smoking status: Never Smoker  . Smokeless tobacco: Never Used  Substance Use Topics  . Alcohol use: No  . Drug use: No    Review of Systems Constitutional: Positive for fever/chills  Eyes: No  visual changes. ENT: No sore throat. Cardiovascular: Denies chest pain. Respiratory: Positive for shortness of breath, cough. Gastrointestinal: Positive for decreased appetite.  Genitourinary: Negative for dysuria. Musculoskeletal: Negative for back pain. Skin: Negative for rash. Neurological: Positive for headache.   ____________________________________________   PHYSICAL EXAM:  VITAL SIGNS: ED Triage Vitals  Enc Vitals Group     BP 08/31/18 2055 (!) 128/96     Pulse Rate 08/31/18 2055 (!) 111     Resp 08/31/18 2244 16     Temp 08/31/18 2055 99.5 F (37.5 C)     Temp Source 08/31/18 2055 Oral     SpO2 08/31/18 2055 97 %     Weight 08/31/18 2100 175 lb (79.4 kg)     Height 08/31/18 2100 5\' 7"  (1.702 m)     Head Circumference --      Peak Flow --      Pain Score 08/31/18 2100 10   Constitutional: Alert and oriented.  Eyes: Conjunctivae are normal.  ENT      Head: Normocephalic and atraumatic.      Nose: No congestion/rhinnorhea.      Mouth/Throat: Mucous membranes are moist.      Neck: No stridor. Hematological/Lymphatic/Immunilogical: No cervical lymphadenopathy. Cardiovascular: Normal rate, regular rhythm.  No murmurs, rubs, or gallops.  Respiratory: Normal respiratory effort without tachypnea nor retractions. Breath sounds are clear and equal bilaterally. No wheezes/rales/rhonchi. Gastrointestinal: Soft and non tender. No rebound.  No guarding.  Genitourinary: Deferred Musculoskeletal: Normal range of motion in all extremities. No lower extremity edema. Neurologic:  Normal speech and language. No gross focal neurologic deficits are appreciated.  Skin:  Skin is warm, dry and intact. No rash noted. Psychiatric: Mood and affect are normal. Speech and behavior are normal. Patient exhibits appropriate insight and judgment.  ____________________________________________    LABS (pertinent positives/negatives)  Trop <0.03 CBC wbc 3.4, hgb 15.2, plt 216 BMP wnl except k  3.4, glu 114, ca 8.7  ____________________________________________   EKG  None  ____________________________________________    RADIOLOGY  None  ____________________________________________   PROCEDURES  Procedures  ____________________________________________   INITIAL IMPRESSION / ASSESSMENT AND PLAN / ED COURSE  Pertinent labs & imaging results that were available during my care of the patient were reviewed by me and considered in my medical decision making (see chart for details).   Patient presented to the emergency department today with signs and symptoms consistent with COVID-19.  She states she tested positive earlier in the day with COVID.  Came to the emergency department today because of concerns for some hemoptysis.  I had a discussion with the patient.  At this point I think her symptoms today are consistent with COVID.  Discussed anticipated course of disease.  Will give patient prescription for inhaler and antibiotics given chest x-ray findings.  ____________________________________________   FINAL CLINICAL IMPRESSION(S) / ED DIAGNOSES  Final diagnoses:  Cough  Shortness of breath     Note: This dictation was prepared with Dragon dictation. Any transcriptional errors that result from this process are unintentional     Phineas SemenGoodman, Cannen Dupras, MD 08/31/18 2259

## 2018-08-31 NOTE — ED Triage Notes (Signed)
Pt arrived via POV with hemoptysis after coughing today. Pt is known to be COVID positive. Pt has headache, chills, fever, and no appetite.

## 2018-08-31 NOTE — Discharge Instructions (Addendum)
Please seek medical attention for any high fevers, chest pain, shortness of breath, change in behavior, persistent vomiting, bloody stool or any other new or concerning symptoms.  

## 2018-09-02 DIAGNOSIS — U071 COVID-19: Secondary | ICD-10-CM

## 2018-09-02 HISTORY — DX: COVID-19: U07.1

## 2018-11-05 ENCOUNTER — Ambulatory Visit: Payer: Self-pay | Admitting: Licensed Clinical Social Worker

## 2018-11-05 DIAGNOSIS — F4323 Adjustment disorder with mixed anxiety and depressed mood: Secondary | ICD-10-CM

## 2018-11-05 DIAGNOSIS — F411 Generalized anxiety disorder: Secondary | ICD-10-CM

## 2018-11-05 NOTE — Progress Notes (Signed)
Counselor/Therapist Progress Note  Patient ID: Jordan [name], MRN: [id number],    Date: 11/05/2018  Time Spent: 42 minutes    Treatment Type: Psychotherapy  Reported Symptoms: Panic attacks and anxiety, sleep disturbance   Mental Status Exam:  Appearance:   NA     Behavior:  Appropriate  Motor:  Normal  Speech/Language:   NA and Normal Rate  Affect:  NA  Mood:  normal  Thought process:  normal  Thought content:    WNL and anxious thoughts   Sensory/Perceptual disturbances:    WNL  Orientation:  oriented to person, place and time/date  Attention:  Good  Concentration:  Good  Memory:  WNL  Fund of knowledge:   Good  Insight:    Fair  Judgment:   Good  Impulse Control:  Good   Risk Assessment: Danger to Self:  No Self-injurious Behavior: No Danger to Others: No Duty to Warn:no Physical Aggression / Violence:No  Access to Firearms a concern: No  Gang Involvement:No   Subjective: Patient was engaged and cooperative throughout the session using time effectively to discuss stressors and coping strategies. Patient continues to voice significant anxiety symptoms. She reports benefit from meds prescribed for anxiety and reports continued motivation for treatment.   Patient is likely to benefit from future treatment because she remains motivated to decrease anxiety and depression symptoms.       Interventions: Cognitive Behavioral Therapy and Mindfulness Meditation  Established psychological safety. Engaged patient in processing current psychosocial stressors- increased anxiety due to ongoing work conflict. Explored patent's perceptions of work conflict, highlighting thought distortions leading to distress and reframeing these thoughts. Reviewed CBT - noticing thoughts, emotions, and behaviors. Taught patient to notice thoughts, challeng thoughts, and reframe thoughts. Engaged patient in mindfulness exercise, processed exercise and encouraged patient to increase use of  mindfulness. Provided support through active listening, validation of feelings, and highlighted patient's strengths.    Diagnosis:   ICD-10-CM   1. Adjustment disorder with mixed anxiety and depressed mood  F43.23   2. Generalized anxiety disorder  F41.1     Plan: Continue CBTs, and every two week sessions. Patient is also encouraged to continue taking meds as prescribed by PCP.   Interpreter used: NA    Milton Ferguson, LCSW

## 2018-11-19 ENCOUNTER — Ambulatory Visit: Payer: Self-pay | Admitting: Licensed Clinical Social Worker

## 2018-11-19 DIAGNOSIS — F411 Generalized anxiety disorder: Secondary | ICD-10-CM

## 2018-11-19 DIAGNOSIS — F4323 Adjustment disorder with mixed anxiety and depressed mood: Secondary | ICD-10-CM

## 2018-11-19 NOTE — Progress Notes (Signed)
Counselor/Therapist Progress Note  Patient ID: LUA FENG, MRN: [id number],    Date: 11/19/2018  Time Spent: 30 minutes   Treatment Type: Psychotherapy  Reported Symptoms: sleep disturbance, mood improvement, low energy/fatigue  Mental Status Exam:  Appearance:   NA     Behavior:  Appropriate  Motor:  NA  Speech/Language:   Normal Rate  Affect:  NA  Mood:  normal  Thought process:  normal  Thought content:    WNL  Sensory/Perceptual disturbances:    WNL  Orientation:  oriented to person, place and time/date  Attention:  Fair  Concentration:  Good  Memory:  WNL  Fund of knowledge:   Good  Insight:    Good  Judgment:   Good  Impulse Control:  Good   Risk Assessment: Danger to Self:  No Self-injurious Behavior: No Danger to Others: No Duty to Warn:no Physical Aggression / Violence:No  Access to Firearms a concern: No  Gang Involvement:No   Subjective: Patient was cooperative throughout the session using time effectively to discuss thoughts, feelings, and progress made. Patient voices benefit from Lexapro - decrease in mood and anxiety symptoms. She reports that she is better able to control her thoughts and emotions and benefits from balanced/positive thinking, prayer and doing projects around the house - when she has the energy. Patient is in agreement to terminate services at this time and is aware that she can return for services in the future, as needed.   Interventions: Cognitive Behavioral Therapy  Established psychological safety. Checked in with patient and explored current mood and psychosocial stressors. Engaged patient in reviewing goal of treatment and progress made. Discussed termination of services and relapse prevention-reviewed coping skills. Provided support through active listening, validation of feelings, and highlighted patient's strengths.    Diagnosis:   ICD-10-CM   1. Generalized anxiety disorder  F41.1   2. Adjustment disorder with mixed  anxiety and depressed mood  F43.23     Plan: Patient is to seek services as needed.   Interpreter used: NA   Milton Ferguson, LCSW

## 2019-02-07 ENCOUNTER — Other Ambulatory Visit: Payer: Self-pay | Admitting: Physician Assistant

## 2019-02-07 ENCOUNTER — Ambulatory Visit
Admission: RE | Admit: 2019-02-07 | Discharge: 2019-02-07 | Disposition: A | Discharge status: Home / Self Care | Payer: Managed Care, Other (non HMO) | Source: Ambulatory Visit | Attending: Physician Assistant | Admitting: Physician Assistant

## 2019-02-07 ENCOUNTER — Other Ambulatory Visit: Payer: Self-pay

## 2019-02-07 DIAGNOSIS — R0602 Shortness of breath: Secondary | ICD-10-CM

## 2019-03-03 ENCOUNTER — Telehealth: Payer: Self-pay | Admitting: Licensed Clinical Social Worker

## 2019-03-03 NOTE — Telephone Encounter (Signed)
Spoke with patient. Patient is requesting medical records from sessions with LCSW. LCSW informed patient that she would need to call the main number [phone] and asked to talk with medical records.

## 2019-03-03 NOTE — Telephone Encounter (Signed)
Patient called and left vm requesting LCSW to return call. LCSW attempted to return call and left vm.

## 2019-03-15 ENCOUNTER — Encounter: Payer: Self-pay | Admitting: Emergency Medicine

## 2019-03-15 ENCOUNTER — Emergency Department: Payer: Managed Care, Other (non HMO)

## 2019-03-15 ENCOUNTER — Emergency Department
Admission: EM | Admit: 2019-03-15 | Discharge: 2019-03-16 | Disposition: A | Discharge status: Home / Self Care | Payer: Managed Care, Other (non HMO) | Attending: Emergency Medicine | Admitting: Emergency Medicine

## 2019-03-15 ENCOUNTER — Other Ambulatory Visit: Payer: Self-pay

## 2019-03-15 DIAGNOSIS — N390 Urinary tract infection, site not specified: Secondary | ICD-10-CM

## 2019-03-15 DIAGNOSIS — R509 Fever, unspecified: Secondary | ICD-10-CM

## 2019-03-15 DIAGNOSIS — Z20828 Contact with and (suspected) exposure to other viral communicable diseases: Secondary | ICD-10-CM | POA: Insufficient documentation

## 2019-03-15 DIAGNOSIS — R0602 Shortness of breath: Secondary | ICD-10-CM

## 2019-03-15 DIAGNOSIS — R05 Cough: Secondary | ICD-10-CM | POA: Diagnosis not present

## 2019-03-15 LAB — CBC WITH DIFFERENTIAL/PLATELET
Abs Immature Granulocytes: 0.06 10*3/uL (ref 0.00–0.07)
Basophils Absolute: 0 10*3/uL (ref 0.0–0.1)
Basophils Relative: 0 %
Eosinophils Absolute: 0 10*3/uL (ref 0.0–0.5)
Eosinophils Relative: 0 %
HCT: 42.4 % (ref 36.0–46.0)
Hemoglobin: 14.3 g/dL (ref 12.0–15.0)
Immature Granulocytes: 1 %
Lymphocytes Relative: 5 %
Lymphs Abs: 0.6 10*3/uL — ABNORMAL LOW (ref 0.7–4.0)
MCH: 31.2 pg (ref 26.0–34.0)
MCHC: 33.7 g/dL (ref 30.0–36.0)
MCV: 92.4 fL (ref 80.0–100.0)
Monocytes Absolute: 0.5 10*3/uL (ref 0.1–1.0)
Monocytes Relative: 5 %
Neutro Abs: 10.1 10*3/uL — ABNORMAL HIGH (ref 1.7–7.7)
Neutrophils Relative %: 89 %
Platelets: 223 10*3/uL (ref 150–400)
RBC: 4.59 MIL/uL (ref 3.87–5.11)
RDW: 11.7 % (ref 11.5–15.5)
WBC: 11.4 10*3/uL — ABNORMAL HIGH (ref 4.0–10.5)
nRBC: 0 % (ref 0.0–0.2)

## 2019-03-15 MED ORDER — SODIUM CHLORIDE 0.9 % IV SOLN
2.0000 g | INTRAVENOUS | Status: DC
  Administered 2019-03-15: 2 g via INTRAVENOUS
  Filled 2019-03-15: qty 20

## 2019-03-15 MED ORDER — SODIUM CHLORIDE 0.9 % IV BOLUS (SEPSIS)
500.0000 mL | Freq: Once | INTRAVENOUS | Status: AC
Start: 2019-03-16 — End: 2019-03-16
  Administered 2019-03-15: 500 mL via INTRAVENOUS

## 2019-03-15 MED ORDER — SODIUM CHLORIDE 0.9 % IV SOLN
500.0000 mg | INTRAVENOUS | Status: DC
  Administered 2019-03-16: 500 mg via INTRAVENOUS
  Filled 2019-03-15: qty 500

## 2019-03-15 MED ORDER — ACETAMINOPHEN 500 MG PO TABS
1000.0000 mg | ORAL_TABLET | Freq: Once | ORAL | Status: AC
  Administered 2019-03-15: 1000 mg via ORAL
  Filled 2019-03-15: qty 2

## 2019-03-15 MED ORDER — SODIUM CHLORIDE 0.9 % IV BOLUS (SEPSIS)
1000.0000 mL | Freq: Once | INTRAVENOUS | Status: AC
  Administered 2019-03-15: 1000 mL via INTRAVENOUS

## 2019-03-15 NOTE — ED Triage Notes (Signed)
Patient brought in from home with complaint of shortness of breath and chest pain. Patient states that she has had shortness of breath since October. Patient states that tonight when she laid down she had a really hard time breathing. Per ems vital signs were stable except respiratory rate was 40.

## 2019-03-15 NOTE — Progress Notes (Signed)
CODE SEPSIS - PHARMACY COMMUNICATION  **Broad Spectrum Antibiotics should be administered within 1 hour of Sepsis diagnosis**  Time Code Sepsis Called/Page Received: 2324  Antibiotics Ordered: Rocephin and Zithromax  Time of 1st antibiotic administration: 2343  Additional action taken by pharmacy: n/a  If necessary, Name of Provider/Nurse Contacted: n/a    Ena Dawley ,PharmD Clinical Pharmacist  03/15/2019  11:47 PM

## 2019-03-16 ENCOUNTER — Emergency Department: Payer: Managed Care, Other (non HMO)

## 2019-03-16 ENCOUNTER — Encounter: Payer: Self-pay | Admitting: Radiology

## 2019-03-16 LAB — COMPREHENSIVE METABOLIC PANEL
ALT: 17 U/L (ref 0–44)
AST: 21 U/L (ref 15–41)
Albumin: 4.4 g/dL (ref 3.5–5.0)
Alkaline Phosphatase: 59 U/L (ref 38–126)
Anion gap: 10 (ref 5–15)
BUN: 8 mg/dL (ref 6–20)
CO2: 25 mmol/L (ref 22–32)
Calcium: 9.6 mg/dL (ref 8.9–10.3)
Chloride: 105 mmol/L (ref 98–111)
Creatinine, Ser: 0.97 mg/dL (ref 0.44–1.00)
GFR calc Af Amer: >60 mL/min (ref >60)
GFR calc non Af Amer: >60 mL/min (ref >60)
Glucose, Bld: 116 mg/dL — ABNORMAL HIGH (ref 70–99)
Potassium: 3.5 mmol/L (ref 3.5–5.1)
Sodium: 140 mmol/L (ref 135–145)
Total Bilirubin: 1.3 mg/dL — ABNORMAL HIGH (ref 0.3–1.2)
Total Protein: 8.4 g/dL — ABNORMAL HIGH (ref 6.5–8.1)

## 2019-03-16 LAB — URINALYSIS, COMPLETE (UACMP) WITH MICROSCOPIC
Bacteria, UA: NONE SEEN
Bilirubin Urine: NEGATIVE
Glucose, UA: NEGATIVE mg/dL
Ketones, ur: NEGATIVE mg/dL
Nitrite: NEGATIVE
Protein, ur: NEGATIVE mg/dL
Specific Gravity, Urine: 1.029 (ref 1.005–1.030)
pH: 7 (ref 5.0–8.0)

## 2019-03-16 LAB — LACTIC ACID, PLASMA: Lactic Acid, Venous: 1.4 mmol/L (ref 0.5–1.9)

## 2019-03-16 LAB — BRAIN NATRIURETIC PEPTIDE: B Natriuretic Peptide: 10 pg/mL (ref 0.0–100.0)

## 2019-03-16 LAB — TROPONIN I (HIGH SENSITIVITY): Troponin I (High Sensitivity): <2 ng/L (ref <18)

## 2019-03-16 LAB — URINE CULTURE

## 2019-03-16 LAB — SARS CORONAVIRUS 2 (TAT 6-24 HRS): SARS Coronavirus 2: NEGATIVE

## 2019-03-16 MED ORDER — HYDROCOD POLST-CPM POLST ER 10-8 MG/5ML PO SUER
5.0000 mL | Freq: Once | ORAL | Status: AC
  Administered 2019-03-16: 5 mL via ORAL
  Filled 2019-03-16: qty 5

## 2019-03-16 MED ORDER — HYDROCOD POLST-CPM POLST ER 10-8 MG/5ML PO SUER: 5.0000 mL | Freq: Two times a day (BID) | ORAL | 0 refills | Status: DC

## 2019-03-16 MED ORDER — IPRATROPIUM-ALBUTEROL 0.5-2.5 (3) MG/3ML IN SOLN
3.0000 mL | Freq: Once | RESPIRATORY_TRACT | Status: AC
  Administered 2019-03-16: 3 mL via RESPIRATORY_TRACT
  Filled 2019-03-16: qty 3

## 2019-03-16 MED ORDER — IOHEXOL 350 MG/ML SOLN
75.0000 mL | Freq: Once | INTRAVENOUS | Status: AC | PRN
  Administered 2019-03-16: 01:00:00 75 mL via INTRAVENOUS

## 2019-03-16 MED ORDER — AZITHROMYCIN 250 MG PO TABS: 250.0000 mg | ORAL_TABLET | Freq: Every day | ORAL | 0 refills | Status: DC

## 2019-03-16 MED ORDER — DEXAMETHASONE SODIUM PHOSPHATE 10 MG/ML IJ SOLN
10.0000 mg | Freq: Once | INTRAMUSCULAR | Status: AC
  Administered 2019-03-16: 10 mg via INTRAVENOUS
  Filled 2019-03-16: qty 1

## 2019-03-16 MED ORDER — ALBUTEROL SULFATE (2.5 MG/3ML) 0.083% IN NEBU: 2.5000 mg | INHALATION_SOLUTION | RESPIRATORY_TRACT | 0 refills | Status: DC | PRN

## 2019-03-16 MED ORDER — CEPHALEXIN 500 MG PO CAPS: 500.0000 mg | ORAL_CAPSULE | Freq: Three times a day (TID) | ORAL | 0 refills | Status: DC

## 2019-03-16 MED ORDER — COMPRESSOR/NEBULIZER MISC: 1.0000 [IU] | 0 refills | Status: DC | PRN

## 2019-03-16 NOTE — Discharge Instructions (Signed)
Take antibiotics as prescribed:  Azithromycin 250mg  daily x 4 days  Keflex 500mg  three times daily x 7 days 2. Use Albuterol nebulizer every 4 hours as needed for cough/wheezing/difficulty breathing. 3. Take cough medicine as needed (Tussionex). 4. Return to the ER for worsening symptoms, persistent vomiting, difficulty breathing or other concerns.

## 2019-03-16 NOTE — ED Provider Notes (Signed)
[facility] Emergency Department Provider Note   ____________________________________________   First MD Initiated Contact with Patient 03/15/19 2322     (approximate)  I have reviewed the triage vital signs and the nursing notes.   HISTORY  Chief Complaint Shortness of Breath and Chest Pain    HPI Jordan [name] Plan is a 33 y.o. female brought to the ED from home via EMS with a chief complaint of shortness of breath.  Patient had COVID-19 in May.  Since that time she has had residual dyspnea with both exercise and at times at rest.  The symptoms have been worse since October.  She had an initial consult with pulmonologist Dr. Raul Del on 11/9 who placed her on Z-Pak, Tessalon Perles, Albuterol MDI.  Echocardiogram scheduled.  States she was laying back in bed tonight and felt like she could not breathe and was afraid to go to sleep.  Was not aware she was running a fever.  Denies chest pain, abdominal pain, nausea, vomiting, diarrhea.  States she occasionally wheezes.  Denies recent travel or trauma.  States that she is under increased stress because of her job.  Tested again for COVID-19 on 11/12 which was negative.       Past Medical History:  Diagnosis Date   Blood transfusion without reported diagnosis    Seizures ([place])     There are no active problems to display for this patient.   History reviewed. No pertinent surgical history.  Prior to Admission medications   Medication Sig Start Date End Date Taking? Authorizing Provider  albuterol (PROVENTIL) (2.5 MG/3ML) 0.083% nebulizer solution Take 3 mLs (2.5 mg total) by nebulization every 4 (four) hours as needed for wheezing or shortness of breath. 03/16/19   Paulette Blanch, MD  albuterol (VENTOLIN HFA) 108 (90 Base) MCG/ACT inhaler Inhale 2 puffs into the lungs every 6 (six) hours as needed for wheezing or shortness of breath. 08/31/18   Nance Pear, MD  azithromycin (ZITHROMAX) 250 MG tablet  Take 1 tablet (250 mg total) by mouth daily. 03/16/19   Paulette Blanch, MD  cephALEXin (KEFLEX) 500 MG capsule Take 1 capsule (500 mg total) by mouth 3 (three) times daily. 03/16/19   Paulette Blanch, MD  chlorpheniramine-HYDROcodone (TUSSIONEX PENNKINETIC ER) 10-8 MG/5ML SUER Take 5 mLs by mouth 2 (two) times daily. 03/16/19   Paulette Blanch, MD  Nebulizers (COMPRESSOR/NEBULIZER) MISC 1 Units by Does not apply route every 4 (four) hours as needed. 03/16/19   Paulette Blanch, MD    Allergies Patient has no known allergies.  Family History  Problem Relation Age of Onset   Healthy Mother    Migraines Mother    Healthy Father     Social History Social History   Tobacco Use   Smoking status: Never Smoker   Smokeless tobacco: Never Used  Substance Use Topics   Alcohol use: No   Drug use: No    Review of Systems  Constitutional: No fever/chills Eyes: No visual changes. ENT: No sore throat. Cardiovascular: Denies chest pain. Respiratory: Positive for cough, wheezing and shortness of breath. Gastrointestinal: No abdominal pain.  No nausea, no vomiting.  No diarrhea.  No constipation. Genitourinary: Negative for dysuria. Musculoskeletal: Negative for back pain. Skin: Negative for rash. Neurological: Negative for headaches, focal weakness or numbness. Psychiatric:  Positive for anxiety.  ____________________________________________   PHYSICAL EXAM:  VITAL SIGNS: ED Triage Vitals  Enc Vitals Group     BP 03/15/19 2309 Marland Kitchen)  151/93     Pulse Rate 03/15/19 2309 (!) 120     Resp 03/15/19 2309 (!) 28     Temp 03/15/19 2309 (!) 101 F (38.3 C)     Temp Source 03/15/19 2309 Oral     SpO2 03/15/19 2309 95 %     Weight 03/15/19 2310 167 lb (75.8 kg)     Height 03/15/19 2310 5\' 6"  (1.676 m)     Head Circumference --      Peak Flow --      Pain Score 03/15/19 2310 9     Pain Loc --      Pain Edu? --      Excl. in [place]? --     Constitutional: Alert and oriented. Well appearing  and in mild acute distress. Eyes: Conjunctivae are normal. PERRL. EOMI. Head: Atraumatic. Nose: No congestion/rhinnorhea. Mouth/Throat: Mucous membranes are moist.  Oropharynx non-erythematous. Neck: No stridor.   Cardiovascular: Tachycardic rate, regular rhythm. Grossly normal heart sounds.  Good peripheral circulation. Respiratory: Increased respiratory effort.  No retractions. Lungs with scattered rhonchi. Gastrointestinal: Soft and nontender. No distention. No abdominal bruits. No CVA tenderness. Musculoskeletal: No lower extremity tenderness nor edema.  No joint effusions. Neurologic:  Normal speech and language. No gross focal neurologic deficits are appreciated.  Skin:  Skin is warm, dry and intact. No rash noted.  No petechiae. Psychiatric: Mood and affect are normal. Speech and behavior are normal.  ____________________________________________   LABS (all labs ordered are listed, but only abnormal results are displayed)  Labs Reviewed  CBC WITH DIFFERENTIAL/PLATELET - Abnormal; Notable for the following components:      Result Value   WBC 11.4 (*)    Neutro Abs 10.1 (*)    Lymphs Abs 0.6 (*)    All other components within normal limits  COMPREHENSIVE METABOLIC PANEL - Abnormal; Notable for the following components:   Glucose, Bld 116 (*)    Total Protein 8.4 (*)    Total Bilirubin 1.3 (*)    All other components within normal limits  URINALYSIS, COMPLETE (UACMP) WITH MICROSCOPIC - Abnormal; Notable for the following components:   Color, Urine YELLOW (*)    APPearance CLOUDY (*)    Hgb urine dipstick LARGE (*)    Leukocytes,Ua MODERATE (*)    All other components within normal limits  CULTURE, BLOOD (ROUTINE X 2)  CULTURE, BLOOD (ROUTINE X 2)  URINE CULTURE  SARS CORONAVIRUS 2 (TAT 6-24 HRS)  LACTIC ACID, PLASMA  BRAIN NATRIURETIC PEPTIDE  LACTIC ACID, PLASMA  TROPONIN I (HIGH SENSITIVITY)  TROPONIN I (HIGH SENSITIVITY)    ____________________________________________  EKG  ED ECG REPORT I, Deneka Greenwalt J, the attending physician, personally viewed and interpreted this ECG.   Date: 03/16/2019  EKG Time: 2313  Rate: 121  Rhythm: sinus tachycardia  Axis: Normal  Intervals:none  ST&T Change: Nonspecific  ____________________________________________  RADIOLOGY  ED MD interpretation: Atelectasis left base; no PE  Official radiology report(s): Ct Angio Chest Pe W/cm &/or Wo Cm  Result Date: 03/16/2019 CLINICAL DATA:  Chest pain and shortness of breath. "Covid long-hauler" EXAM: CT ANGIOGRAPHY CHEST WITH CONTRAST TECHNIQUE: Multidetector CT imaging of the chest was performed using the standard protocol during bolus administration of intravenous contrast. Multiplanar CT image reconstructions and MIPs were obtained to evaluate the vascular anatomy. CONTRAST:  150mL OMNIPAQUE IOHEXOL 350 MG/ML SOLN, 2 injections of 75 cc Omnipaque 350 due to suboptimal pulmonary artery opacification on initial injection. COMPARISON:  Radiograph yesterday. FINDINGS: Cardiovascular: There are  no filling defects within the pulmonary arteries to suggest pulmonary embolus. The thoracic aorta is normal in caliber. Conventional branching pattern from the aortic arch. No aortic dissection. Heart is normal in size. No pericardial effusion. Mediastinum/Nodes: No enlarged mediastinal and hilar lymph nodes. Prominent prevascular node measures up to 7 mm. Esophagus is decompressed. No visualized thyroid nodule. Lungs/Pleura: Heterogeneous attenuation of pulmonary parenchyma particularly in the lower lobes with geographic areas of slight ground-glass density, findings are minimal a initial bolus, and more pronounced on second IV contrast scan. There a few areas of ground-glass nodularity in the lower lobes, for example series 16, image 34. Subsegmental atelectasis in both dependent lower lobes. No pleural fluid. No evidence of pulmonary edema.  Perifissural 6 mm nodule in the left upper lobe series 16, image 21, likely post infectious or inflammatory in a patient of this age. No confluent airspace disease. Upper Abdomen: No acute abnormality. Musculoskeletal: Minimal broad-based scoliotic curvature of the spine1. There are no acute or suspicious osseous abnormalities. Review of the MIP images confirms the above findings. IMPRESSION: 1. No pulmonary embolus. 2. Few areas of scattered nodular ground-glass opacities which may be related to known COVID-19 infection. 3. Heterogeneous pulmonary parenchyma, seen primarily during second contrast bolus, likely related to lower lung volumes/hypoventilatory change, and was not present on initial imaging. 4. Perifissural 6 mm nodule in the left upper lobe is likely post infectious or inflammatory in a patient of this age. Ablation is society guidelines do not apply to a patient of this age, and in the absence of known malignancy, no dedicated imaging follow-up is needed. Electronically Signed   By: Narda Rutherford M.D.   On: 03/16/2019 01:06   Dg Chest Port 1 View  Result Date: 03/15/2019 CLINICAL DATA:  Shortness of breath EXAM: PORTABLE CHEST 1 VIEW COMPARISON:  02/07/2019 FINDINGS: Minimal atelectasis left base. No consolidation or effusion. Normal heart size. No pneumothorax. Mild scoliosis. IMPRESSION: No active disease.  Minimal patchy atelectasis left base Electronically Signed   By: Jasmine Pang M.D.   On: 03/15/2019 23:46    ____________________________________________   PROCEDURES  Procedure(s) performed (including Critical Care):  Procedures   ____________________________________________   INITIAL IMPRESSION / ASSESSMENT AND PLAN / ED COURSE  As part of my medical decision making, I reviewed the following data within the electronic MEDICAL RECORD NUMBER Nursing notes reviewed and incorporated, Labs reviewed, EKG interpreted, Old chart reviewed, Radiograph reviewed and Notes from prior ED  visits     LETHA MIRABAL was evaluated in Emergency Department on 03/16/2019 for the symptoms described in the history of present illness. She was evaluated in the context of the global COVID-19 pandemic, which necessitated consideration that the patient might be at risk for infection with the SARS-CoV-2 virus that causes COVID-19. Institutional protocols and algorithms that pertain to the evaluation of patients at risk for COVID-19 are in a state of rapid change based on information released by regulatory bodies including the CDC and federal and state organizations. These policies and algorithms were followed during the patient's care in the ED.    33 year old female who is several months post COVID-19 with shortness of breath. Differential includes, but is not limited to, viral syndrome, bronchitis including COPD exacerbation, pneumonia, reactive airway disease including asthma, CHF including exacerbation with or without pulmonary/interstitial edema, pneumothorax, ACS, thoracic trauma, and pulmonary embolism.  Fever, tachycardia and tachypnea noted.  ED code sepsis initiated.  IV fluids and antibiotics ordered.  Will obtain CT chest to evaluate  for PE.  Administer IV Decadron, Tussionex and DuoNeb.   Clinical Course as of Mar 15 300  Mon Mar 16, 2019  0142 Tachycardia and tachypnea improving.  Updated patient of CT scan results.  She is feeling better after DuoNeb.  Awaiting urinalysis.   [JS]  [id number] Patient feeling significantly better.  She is afebrile and heart rate has gone down to 100.  Updated her on urinalysis result.  States she was never put on Z-Pak by the pulmonologist.  Will discharge home on Z-Pak, Keflex for UTI, nebulizer machine and albuterol solution, and Tussionex.  Strict return precautions given.  Patient verbalizes understanding and agrees with plan of care.   [JS]    Clinical Course User Index [JS] Irean Hong, MD      ____________________________________________   FINAL CLINICAL IMPRESSION(S) / ED DIAGNOSES  Final diagnoses:  Fever, unspecified fever cause  SOB (shortness of breath)  Urinary tract infection without hematuria, site unspecified     ED Discharge Orders         Ordered    azithromycin (ZITHROMAX) 250 MG tablet  Daily     03/16/19 0256    cephALEXin (KEFLEX) 500 MG capsule  3 times daily     03/16/19 0256    albuterol (PROVENTIL) (2.5 MG/3ML) 0.083% nebulizer solution  Every 4 hours PRN     03/16/19 0256    Nebulizers (COMPRESSOR/NEBULIZER) MISC  Every 4 hours PRN     03/16/19 0256    chlorpheniramine-HYDROcodone (TUSSIONEX PENNKINETIC ER) 10-8 MG/5ML SUER  2 times daily     03/16/19 0256           Note:  This document was prepared using Dragon voice recognition software and may include unintentional dictation errors.   Irean Hong, MD 03/16/19 [phone]

## 2019-03-16 NOTE — ED Notes (Signed)
Patient transported to CT 

## 2019-03-16 NOTE — ED Notes (Signed)
Patient assisted to the bathroom 

## 2019-03-20 LAB — CULTURE, BLOOD (ROUTINE X 2)
Culture: NO GROWTH
Culture: NO GROWTH
Special Requests: ADEQUATE

## 2019-07-04 ENCOUNTER — Emergency Department
Admission: EM | Admit: 2019-07-04 | Discharge: 2019-07-04 | Disposition: A | Discharge status: Home / Self Care | Payer: 59 | Attending: Emergency Medicine | Admitting: Emergency Medicine

## 2019-07-04 ENCOUNTER — Other Ambulatory Visit: Payer: Self-pay

## 2019-07-04 ENCOUNTER — Emergency Department: Payer: 59

## 2019-07-04 ENCOUNTER — Encounter: Payer: Self-pay | Admitting: Emergency Medicine

## 2019-07-04 DIAGNOSIS — R55 Syncope and collapse: Secondary | ICD-10-CM | POA: Diagnosis not present

## 2019-07-04 DIAGNOSIS — S20222A Contusion of left back wall of thorax, initial encounter: Secondary | ICD-10-CM | POA: Diagnosis not present

## 2019-07-04 DIAGNOSIS — S0993XA Unspecified injury of face, initial encounter: Secondary | ICD-10-CM | POA: Diagnosis present

## 2019-07-04 DIAGNOSIS — Z79899 Other long term (current) drug therapy: Secondary | ICD-10-CM | POA: Diagnosis not present

## 2019-07-04 DIAGNOSIS — S0081XA Abrasion of other part of head, initial encounter: Secondary | ICD-10-CM | POA: Insufficient documentation

## 2019-07-04 DIAGNOSIS — Y929 Unspecified place or not applicable: Secondary | ICD-10-CM | POA: Insufficient documentation

## 2019-07-04 DIAGNOSIS — Y9389 Activity, other specified: Secondary | ICD-10-CM | POA: Insufficient documentation

## 2019-07-04 DIAGNOSIS — W108XXA Fall (on) (from) other stairs and steps, initial encounter: Secondary | ICD-10-CM | POA: Insufficient documentation

## 2019-07-04 DIAGNOSIS — M542 Cervicalgia: Secondary | ICD-10-CM | POA: Insufficient documentation

## 2019-07-04 DIAGNOSIS — Y999 Unspecified external cause status: Secondary | ICD-10-CM | POA: Diagnosis not present

## 2019-07-04 DIAGNOSIS — R6884 Jaw pain: Secondary | ICD-10-CM | POA: Insufficient documentation

## 2019-07-04 DIAGNOSIS — S0083XA Contusion of other part of head, initial encounter: Secondary | ICD-10-CM | POA: Diagnosis not present

## 2019-07-04 MED ORDER — ACETAMINOPHEN 325 MG PO TABS
650.0000 mg | ORAL_TABLET | Freq: Once | ORAL | Status: AC
  Administered 2019-07-04: 12:00:00 650 mg via ORAL
  Filled 2019-07-04: qty 2

## 2019-07-04 NOTE — Discharge Instructions (Addendum)
You can continue to take Tylenol or ibuprofen over-the-counter as needed for pain.  Return to the ER for new, worsening, or persistent severe pain, weakness or numbness, severe headache, difficulty eating or chewing, or any other new or worsening symptoms that concern you.

## 2019-07-04 NOTE — ED Notes (Signed)
Pt verbalized understanding of discharge instructions. NAD at this time. 

## 2019-07-04 NOTE — ED Triage Notes (Signed)
Pt to ED via POV, Pt ambulatory to triage in NAD. Pt states that she tripped and fell down the steps on Wednesday. Pt reports that she fell down 20 steps. Pt states that she did have LOC. Pt states that she has been trying to "doctor" on herself at home. Pt is having pain in her left side of her face. Pt states that she can barely open her jaw. Pt also c/o pain in her left back.

## 2019-07-04 NOTE — ED Provider Notes (Signed)
[facility] Emergency Department Provider Note ____________________________________________   First MD Initiated Contact with Patient 07/04/19 1114     (approximate)  I have reviewed the triage vital signs and the nursing notes.   HISTORY  Chief Complaint Fall    HPI Jordan [name] is a 34 y.o. female with PMH as noted below who presents with facial, neck, and back pain after a fall 2 days ago.  The patient states that she had a trip and fall down about 20 stairs.  She did briefly lose consciousness.  She states that she now feels persistent pain to the left side of her jaw, her neck especially on the left side, as well as the upper to mid back on the left and in the middle.  She denies significant headache or vomiting.  Past Medical History:  Diagnosis Date  . Blood transfusion without reported diagnosis   . Seizures ([facility])     There are no problems to display for this patient.   History reviewed. No pertinent surgical history.  Prior to Admission medications   Medication Sig Start Date End Date Taking? Authorizing Provider  albuterol (PROVENTIL) (2.5 MG/3ML) 0.083% nebulizer solution Take 3 mLs (2.5 mg total) by nebulization every 4 (four) hours as needed for wheezing or shortness of breath. 03/16/19   Irean Hong, MD  albuterol (VENTOLIN HFA) 108 (90 Base) MCG/ACT inhaler Inhale 2 puffs into the lungs every 6 (six) hours as needed for wheezing or shortness of breath. 08/31/18   Phineas Semen, MD  azithromycin (ZITHROMAX) 250 MG tablet Take 1 tablet (250 mg total) by mouth daily. 03/16/19   Irean Hong, MD  cephALEXin (KEFLEX) 500 MG capsule Take 1 capsule (500 mg total) by mouth 3 (three) times daily. 03/16/19   Irean Hong, MD  chlorpheniramine-HYDROcodone (TUSSIONEX PENNKINETIC ER) 10-8 MG/5ML SUER Take 5 mLs by mouth 2 (two) times daily. 03/16/19   Irean Hong, MD  Nebulizers (COMPRESSOR/NEBULIZER) MISC 1 Units by Does not apply route  every 4 (four) hours as needed. 03/16/19   Irean Hong, MD    Allergies Patient has no known allergies.  Family History  Problem Relation Age of Onset  . Healthy Mother   . Migraines Mother   . Healthy Father     Social History Social History   Tobacco Use  . Smoking status: Never Smoker  . Smokeless tobacco: Never Used  Substance Use Topics  . Alcohol use: No  . Drug use: No    Review of Systems  Constitutional: No fever. Eyes: No redness. ENT: No sore throat. Cardiovascular: Denies chest pain. Respiratory: Denies shortness of breath. Gastrointestinal: No vomiting. Genitourinary: Negative for flank pain.  Musculoskeletal: Positive for back pain. Skin: Negative for rash. Neurological: Negative for headache.   ____________________________________________   PHYSICAL EXAM:  VITAL SIGNS: ED Triage Vitals  Enc Vitals Group     BP 07/04/19 1048 131/90     Pulse Rate 07/04/19 1048 (!) 121     Resp 07/04/19 1048 18     Temp 07/04/19 1048 98.9 F (37.2 C)     Temp Source 07/04/19 1048 Oral     SpO2 07/04/19 1048 99 %     Weight 07/04/19 1053 160 lb (72.6 kg)     Height 07/04/19 1053 5\' 6"  (1.676 m)     Head Circumference --      Peak Flow --      Pain Score 07/04/19 1053 10  Pain Loc --      Pain Edu? --      Excl. in [place]? --     Constitutional: Alert and oriented.  Relatively well appearing and in no acute distress. Eyes: Conjunctivae are normal.  EOMI. Head: Abrasion to left temporal area.  Tenderness over the left side of the mandible.  No significant maxillary or nasal bony tenderness. Nose: No congestion/rhinnorhea. Mouth/Throat: Mucous membranes are moist.   Neck: Normal range of motion.  Left paraspinal and trapezius area tenderness with no significant midline tenderness. Cardiovascular: Good peripheral circulation. Respiratory: Normal respiratory effort.  No retractions. Gastrointestinal:  No distention.  Genitourinary: No CVA  tenderness. Musculoskeletal: No lower extremity edema.  Extremities warm and well perfused.  Thoracic midline and left paraspinal muscle tenderness with no step-off or crepitus. Neurologic:  Normal speech and language.  Motor and sensory intact in all extremities.  Normal coordination. Skin:  Skin is warm and dry. No rash noted. Psychiatric: Mood and affect are normal. Speech and behavior are normal.  ____________________________________________   LABS (all labs ordered are listed, but only abnormal results are displayed)  Labs Reviewed - No data to display ____________________________________________  EKG   ____________________________________________  RADIOLOGY  CT maxillofacial: No acute fracture XR cervical spine: Retrolisthesis of C5 on C6 with no acute fracture XR thoracic spine: No acute fracture XR ribs/chest left: No acute fracture  CT cervical spine: No acute fracture or retrolisthesis. ____________________________________________   PROCEDURES  Procedure(s) performed: No  Procedures  Critical Care performed: No ____________________________________________   INITIAL IMPRESSION / ASSESSMENT AND PLAN / ED COURSE  Pertinent labs & imaging results that were available during my care of the patient were reviewed by me and considered in my medical decision making (see chart for details).  34 year old female with PMH as noted above presents with facial, neck, and back pain after she states that she fell down around 20 steps 2 days ago.  She does report that she had brief LOC at that time.  On exam, the patient has a nonfocal neurologic exam and appears comfortable.  She does have some tenderness to the left mandible, left paracervical area and midline, and the left thoracic back.  Given the lack of any neurologic symptoms and the elapsed time since the fall, there is no indication for imaging of the brain.  I will order a CT maxillofacial to rule out mandible  fracture, as well as x-rays of the cervical and thoracic spine and left ribs in order to avoid unnecessary radiation in this young patient.  ----------------------------------------- 4:16 PM on 07/04/2019 -----------------------------------------  X-ray of the cervical spine showed possible retrolisthesis of C5 on C6, so I proceeded with a CT to rule out acute fracture.  It was negative.  The patient had good pain relief with Tylenol and was stable for discharge.  I counseled her on the results of the work-up and she expressed understanding.  ____________________________________________   FINAL CLINICAL IMPRESSION(S) / ED DIAGNOSES  Final diagnoses:  Contusion of face, initial encounter  Contusion of left side of back, initial encounter      NEW MEDICATIONS STARTED DURING THIS VISIT:  Discharge Medication List as of 07/04/2019  3:20 PM       Note:  This document was prepared using Dragon voice recognition software and may include unintentional dictation errors.   Arta Silence, MD 07/04/19 [phone]

## 2019-09-23 ENCOUNTER — Encounter: Payer: Self-pay | Admitting: Obstetrics

## 2019-09-23 ENCOUNTER — Ambulatory Visit (INDEPENDENT_AMBULATORY_CARE_PROVIDER_SITE_OTHER): Payer: 59 | Admitting: Obstetrics

## 2019-09-23 ENCOUNTER — Other Ambulatory Visit (HOSPITAL_COMMUNITY)
Admission: RE | Admit: 2019-09-23 | Discharge: 2019-09-23 | Disposition: A | Discharge status: Home / Self Care | Payer: 59 | Source: Ambulatory Visit | Attending: Obstetrics | Admitting: Obstetrics

## 2019-09-23 ENCOUNTER — Other Ambulatory Visit: Payer: Self-pay

## 2019-09-23 VITALS — BP 120/80 | Ht 67.0 in | Wt 162.0 lb

## 2019-09-23 DIAGNOSIS — B9689 Other specified bacterial agents as the cause of diseases classified elsewhere: Secondary | ICD-10-CM

## 2019-09-23 DIAGNOSIS — N898 Other specified noninflammatory disorders of vagina: Secondary | ICD-10-CM | POA: Insufficient documentation

## 2019-09-23 DIAGNOSIS — N760 Acute vaginitis: Secondary | ICD-10-CM

## 2019-09-23 DIAGNOSIS — Z01419 Encounter for gynecological examination (general) (routine) without abnormal findings: Secondary | ICD-10-CM

## 2019-09-23 DIAGNOSIS — Z113 Encounter for screening for infections with a predominantly sexual mode of transmission: Secondary | ICD-10-CM

## 2019-09-23 MED ORDER — FLUCONAZOLE 150 MG PO TABS: 150.0000 mg | ORAL_TABLET | Freq: Once | ORAL | 2 refills | Status: AC

## 2019-09-23 MED ORDER — METRONIDAZOLE 0.75 % VA GEL: 1.0000 | Freq: Every day | VAGINAL | 1 refills | Status: DC

## 2019-09-23 NOTE — Progress Notes (Signed)
Gynecology Annual Exam  PCP: Jordan Fossa, PA-C  Chief Complaint:  Chief Complaint  Patient presents with  . Gynecologic Exam    History of Present Illness Jordan [name] is a 34 y.o. G0P0000 who LMP was Patient's last menstrual period was 09/15/2019., presents today for her annual examination.  Her menses are present due to but have been irregular over the last two years. Jordan [name] is new to [place] has not had a GYN PE in several years.. She has had recent infertility workups done at [facility] as she has been unable to conceive. Per her report today, she had a recent HSG which showed tubal scarring, and she is looking into IVF. She does not have vasomotor sx. She uses inhaler meds, and is under the care of a Pulmonologist since developing COVID last year.  She is single partner, contraception - none. She does not have vaginal dryness.  Last Pap: 2020- HPV seen- no f/u for colpo. Results were: pap result unavailable- per pt was referred for colpo /neg HPV DNA.  Hx of STDs: chlamydia, HPV  Last mammogram:NA  There is a  FH of breast cancer (MGM). There is no FH of ovarian cancer. The patient does do self-breast exams. C/O extreme skin sensitivity over her breasts.  Colonoscopy: NA DEXA: has not been screened for osteoporosis  Tobacco use: The patient denies current or previous tobacco use. Alcohol use: none Exercise: moderately active  She does get adequate calcium and Vitamin D in her diet.   The patient is sexually active. She currently uses nothing as she has been told it would be unlikely for her to achieve pregnancy without assistance for contraception. The patient wears seatbelts: yes.   see previous pap note  The patient has regular exercise: no since COVID and respiratory challenges.  The patient has ever been transfused or tattooed?: yes.  The patient reports that domestic violence in her life is absent.    Review of Systems: ROS   Past Medical History:   Past Medical History:  Diagnosis Date  . Blood transfusion without reported diagnosis   . COVID-19 09/02/2018  . Seizures (HCC)     Past Surgical History:  History reviewed. No pertinent surgical history.  Medications: Prior to Admission medications   Medication Sig Start Date End Date Taking? Authorizing Provider  albuterol (PROVENTIL) (2.5 MG/3ML) 0.083% nebulizer solution Take 3 mLs (2.5 mg total) by nebulization every 4 (four) hours as needed for wheezing or shortness of breath. 03/16/19  Yes Irean Hong, MD  albuterol (VENTOLIN HFA) 108 (90 Base) MCG/ACT inhaler Inhale 2 puffs into the lungs every 6 (six) hours as needed for wheezing or shortness of breath. 08/31/18  Yes Phineas Semen, MD  chlorpheniramine-HYDROcodone [facility]) 10-8 MG/5ML SUER Take 5 mLs by mouth 2 (two) times daily. Patient not taking: Reported on 09/23/2019 03/16/19   Irean Hong, MD  fluconazole (DIFLUCAN) 150 MG tablet Take 1 tablet (150 mg total) by mouth once for 1 dose. Can take additional dose three days later if symptoms persist 09/23/19 09/23/19  Mirna Mires, CNM  metroNIDAZOLE (METROGEL) 0.75 % vaginal gel Place 1 Applicatorful vaginally at bedtime for 5 days. 09/23/19 09/28/19  Mirna Mires, CNM  Nebulizers (COMPRESSOR/NEBULIZER) MISC 1 Units by Does not apply route every 4 (four) hours as needed. Patient not taking: Reported on 09/23/2019 03/16/19   Irean Hong, MD    Allergies:  No Known Allergies  Gynecologic History: Patient's last menstrual  period was 09/15/2019.  Obstetric History: G0P0000  Social History:  Social History   Socioeconomic History  . Marital status: Married    Spouse name: Not on file  . Number of children: Not on file  . Years of education: Not on file  . Highest education level: Not on file  Occupational History  . Not on file  Tobacco Use  . Smoking status: Never Smoker  . Smokeless tobacco: Never Used  Substance and Sexual Activity  .  Alcohol use: No  . Drug use: No  . Sexual activity: Yes    Birth control/protection: None  Other Topics Concern  . Not on file  Social History Narrative  . Not on file   Social Determinants of Health   Financial Resource Strain:   . Difficulty of Paying Living Expenses:   Food Insecurity:   . Worried About Charity fundraiser in the Last Year:   . Arboriculturist in the Last Year:   Transportation Needs:   . Film/video editor (Medical):   Marland Kitchen Lack of Transportation (Non-Medical):   Physical Activity:   . Days of Exercise per Week:   . Minutes of Exercise per Session:   Stress:   . Feeling of Stress :   Social Connections:   . Frequency of Communication with Friends and Family:   . Frequency of Social Gatherings with Friends and Family:   . Attends Religious Services:   . Active Member of Clubs or Organizations:   . Attends Archivist Meetings:   Marland Kitchen Marital Status:   Intimate Partner Violence:   . Fear of Current or Ex-Partner:   . Emotionally Abused:   Marland Kitchen Physically Abused:   . Sexually Abused:     Family History:  Family History  Problem Relation Age of Onset  . Healthy Mother   . Migraines Mother   . Healthy Father      Physical Exam Vitals:  Vitals:   09/23/19 1323  BP: 120/80   Physical Exam  Vitals reviewed. Constitutional: She is oriented to person, place, and time. She appears well-developed and well-nourished.  HENT:  Head: Normocephalic and atraumatic.  Eyes: Pupils are equal, round, and reactive to light.  Cardiovascular: Normal rate and regular rhythm.  Respiratory: Breath sounds normal.  GI: Soft. Bowel sounds are normal.  Genitourinary:    Uterus normal.     Vaginal discharge present.   Musculoskeletal:        General: Normal range of motion.     Cervical back: Normal range of motion and neck supple.  Neurological: She is alert and oriented to person, place, and time.  Skin: Skin is warm and dry.  Psychiatric: She has a  normal mood and affect. Her behavior is normal.        Assessment: 34 y.o. G0P0000 well woman exam  Plan:   Annual GYN PE provided today.  2) STI screening was offered - Aptima ordered. Will check for BV, CZ and GC per hr request. Rx for Metrogel. Rx for Diflucan secondary to recent antibiotic usage for pulmonary sxs.   3) Pap done- Will refer for Colposcopy should results warrant f/u 4) She would like an appointment for the removal of some condyloma. appt requested.  5) Follow up 1 year for routine annual exam 6) Rx for Diflucan and Metrogel ( reports chronic BV)  1. Women's annual routine gynecological examination  - metroNIDAZOLE (METROGEL) 0.75 % vaginal gel; Place 1 Applicatorful vaginally at bedtime  for 5 days.  Dispense: 70 g; Refill: 1 - fluconazole (DIFLUCAN) 150 MG tablet; Take 1 tablet (150 mg total) by mouth once for 1 dose. Can take additional dose three days later if symptoms persist  Dispense: 1 tablet; Refill: 2 - Cytology - PAP  2. Routine screening for STI (sexually transmitted infection)  - metroNIDAZOLE (METROGEL) 0.75 % vaginal gel; Place 1 Applicatorful vaginally at bedtime for 5 days.  Dispense: 70 g; Refill: 1 - Cytology - PAP  3. BV (bacterial vaginosis)  - metroNIDAZOLE (METROGEL) 0.75 % vaginal gel; Place 1 Applicatorful vaginally at bedtime for 5 days.  Dispense: 70 g; Refill: 1  4. Vaginal discharge  - metroNIDAZOLE (METROGEL) 0.75 % vaginal gel; Place 1 Applicatorful vaginally at bedtime for 5 days.  Dispense: 70 g; Refill: 1 - fluconazole (DIFLUCAN) 150 MG tablet; Take 1 tablet (150 mg total) by mouth once for 1 dose. Can take additional dose three days later if symptoms persist  Dispense: 1 tablet; Refill: 2 - Cytology - PAP    Mirna Mires, CNM  09/23/2019 5:03 PM

## 2019-09-23 NOTE — Patient Instructions (Addendum)
Colposcopy Colposcopy is a procedure to examine the lowest part of the uterus (cervix), the vagina, and the area around the vaginal opening (vulva) for abnormalities or signs of disease. The procedure is done using a lighted microscope or magnifying lens (colposcope). If any unusual cells are found during the procedure, your health care provider may remove a tissue sample for testing (biopsy). A colposcopy may be done if you:  Have an abnormal Pap test. A Pap test is a screening test that is used to check for signs of cancer or infection of the vagina, cervix, and uterus.  Have a Pap smear test in which you test positive for high-risk HPV (human papillomavirus).  Have a sore or lesion on your cervix.  Have genital warts on your vulva, vagina, or cervix.  Took certain medicines while pregnant, such as diethylstilbestrol (DES).  Have pain during sexual intercourse.  Have vaginal bleeding, especially after sexual intercourse.  Need to have a cervical polyp removed.  Need to have a lost intrauterine device (IUD) string located. Let your health care provider know about:  Any allergies you have, including allergies to prescribed medicine, latex, or iodine.  All medicines you are taking, including vitamins, herbs, eye drops, creams, and over-the-counter medicines. Bring a list of all of your medicines to your appointment.  Any problems you or family members have had with anesthetic medicines.  Any blood disorders you have.  Any surgeries you have had.  Any medical conditions you have, such as pelvic inflammatory disease (PID) or endometrial disorder.  Any history of frequent fainting.  Your menstrual cycle and what form of birth control (contraception) you use.  Your medical history, including any prior cervical treatment.  Whether you are pregnant or may be pregnant. What are the risks? Generally, this is a safe procedure. However, problems may occur,  including:  Pain.  Infection, which may include a fever, bad-smelling discharge, or pelvic pain.  Bleeding or discharge.  Misdiagnosis.  Fainting and vasovagal reactions, but this is rare.  Allergic reactions to medicines.  Damage to other structures or organs. What happens before the procedure?  If you have your menstrual period or will have it at the time of your procedure, tell your health care provider. A colposcopy typically is not done during menstruation.  Continue your contraceptive practices before and after the procedure.  For 24 hours before the colposcopy: ? Do not douche. ? Do not use tampons. ? Do not use medicines, creams, or suppositories in the vagina. ? Do not have sexual intercourse.  Ask your health care provider about: ? Changing or stopping your regular medicines. This is especially important if you are taking diabetes medicines or blood thinners. ? Taking medicines such as aspirin and ibuprofen. These medicines can thin your blood. Do not take these medicines before your procedure if your health care provider instructs you not to. It is likely that your health care provider will tell you to avoid taking aspirin or medicine that contains aspirin for 7 days before the procedure.  Follow instructions from your health care provider about eating or drinking restrictions. You will likely need to eat a regular diet the day of the procedure and not skip any meals.  You may have an exam or testing. A pregnancy test will be taken on the day of the procedure.  You may have a blood or urine sample taken.  Plan to have someone take you home from the hospital or clinic.  If you will be going   home right after the procedure, plan to have someone with you for 24 hours. What happens during the procedure?  You will lie down on your back, with your feet in foot rests (stirrups).  A warmed and lubricated instrument (speculum) will be inserted into your vagina. The  speculum will be used to hold apart the walls of your vagina so your health care provider can see your cervix and the inside of your vagina.  A cotton swab will be used to place a small amount of liquid solution on the areas to be examined. This solution makes it easier to see abnormal cells. You may feel a slight burning during this part.  The colposcope will be used to scan the cervix with a bright white light. The colposcope will be held near your vulvaand will magnify your vulva, vagina, and cervix for easier examination.  Your health care provider may decide to take a biopsy. If so: ? You may be given medicine to numb the area (local anesthetic). ? Surgical instruments will be used to suck out mucus and cells through your vagina. ? You may feel mild pain while the tissue sample is removed. ? Bleeding may occur. A solution may be used to stop the bleeding. ? If a sample of tissue is needed from the inside of the cervix, a different procedure called endocervical curettage (ECC) may be completed. During this procedure, a curved instrument (curette) will be used to scrape cells from your cervix or the top of your cervix (endocervix).  Your health care provider will record the location of any abnormalities. The procedure may vary among health care providers and hospitals. What happens after the procedure?  You will lie down and rest for a few minutes. You may be offered juice or cookies.  Your blood pressure, heart rate, breathing rate, and blood oxygen level will be monitored until any medicines you were given have worn off.  You may have to wear compression stockings. These stockings help to prevent blood clots and reduce swelling in your legs.  You may have some cramping in your abdomen. This should go away after a few minutes. This information is not intended to replace advice given to you by your health care provider. Make sure you discuss any questions you have with your health care  provider. Document Revised: 03/29/2017 Document Reviewed: 11/21/2015 Elsevier Patient Education  [place].  Genital Warts Genital warts are small growths in the area around the genitals or the anus. They are caused by a type of germ (HPV virus). This germ is spread from person to person during sex. It can be spread through vaginal, anal, and oral sex. Genital warts can lead to other problems if they are not treated. A person is more likely to have this condition if he or she:  Has sex without using a condom.  Has sex with many people.  Has sex before the age of 62.  Has a weak body defense (immune) system. This condition can be treated with medicines. Your doctor may also burn or freeze the warts. In some cases, surgery may be done to remove the warts. Follow these instructions at home: Medicines   Apply over-the-counter and prescription medicines only as told by your doctor.  Do not use medicines that are meant for treating hand warts.  Talk with your doctor about using creams to treat itching. Instructions for women  Plan to have regular tests to check for cervical cancer. Your risk for this cancer increases  when you have genital warts.  If you become pregnant, tell your doctor that you have had genital warts. The germ can be passed to the baby. General instructions  Do not touch or scratch the warts.  Do not have sex until your treatment is done.  Tell your current and past sexual partners about your condition. They may need treatment.  After treatment, use condoms during sex.  Keep all follow-up visits as told by your doctor. This is important. How is this prevented? Talk with your doctor about getting the HPV shot. The HPV shot:  Can help stop some HPV infections and cancers.  Is given to males and females who are 37-57 years old.  Will not work if you already have HPV.  Is not recommended for pregnant women. Contact a doctor if:  You have redness,  swelling, or pain in the area of the treated skin.  You have a fever.  You feel sick.  You feel lumps in the area around your genitals or anus.  You have bleeding in the area around your genitals or anus.  You have pain during sex. Summary  Genital warts are small growths in the areas around the genitals or the anus. They are caused by a type of germ (HPV virus).  The germ is spread by having vaginal, anal, or oral sex without using a condom.  This condition is treated using medicines. In some cases, freezing, burning, or surgery may be done to get rid of the warts.  This condition may be prevented by getting a HPV shot. This information is not intended to replace advice given to you by your health care provider. Make sure you discuss any questions you have with your health care provider. Document Revised: 05/21/2017 Document Reviewed: 05/21/2017 Elsevier Patient Education  2020 [employer].

## 2019-09-25 ENCOUNTER — Ambulatory Visit: Payer: Managed Care, Other (non HMO) | Admitting: Obstetrics & Gynecology

## 2019-09-28 ENCOUNTER — Emergency Department: Payer: 59

## 2019-09-28 ENCOUNTER — Encounter: Payer: Self-pay | Admitting: Emergency Medicine

## 2019-09-28 ENCOUNTER — Inpatient Hospital Stay
Admission: EM | Admit: 2019-09-28 | Discharge: 2019-10-02 | DRG: 871 | Disposition: A | Discharge status: Home / Self Care | Payer: 59 | Attending: Internal Medicine | Admitting: Internal Medicine

## 2019-09-28 ENCOUNTER — Other Ambulatory Visit: Payer: Self-pay

## 2019-09-28 DIAGNOSIS — J9621 Acute and chronic respiratory failure with hypoxia: Secondary | ICD-10-CM | POA: Diagnosis present

## 2019-09-28 DIAGNOSIS — Z6826 Body mass index (BMI) 26.0-26.9, adult: Secondary | ICD-10-CM

## 2019-09-28 DIAGNOSIS — N898 Other specified noninflammatory disorders of vagina: Secondary | ICD-10-CM

## 2019-09-28 DIAGNOSIS — R6520 Severe sepsis without septic shock: Secondary | ICD-10-CM | POA: Diagnosis present

## 2019-09-28 DIAGNOSIS — Z79899 Other long term (current) drug therapy: Secondary | ICD-10-CM

## 2019-09-28 DIAGNOSIS — J189 Pneumonia, unspecified organism: Secondary | ICD-10-CM

## 2019-09-28 DIAGNOSIS — Y95 Nosocomial condition: Secondary | ICD-10-CM | POA: Diagnosis present

## 2019-09-28 DIAGNOSIS — E876 Hypokalemia: Secondary | ICD-10-CM | POA: Diagnosis present

## 2019-09-28 DIAGNOSIS — Z01419 Encounter for gynecological examination (general) (routine) without abnormal findings: Secondary | ICD-10-CM

## 2019-09-28 DIAGNOSIS — R9389 Abnormal findings on diagnostic imaging of other specified body structures: Secondary | ICD-10-CM

## 2019-09-28 DIAGNOSIS — Z0184 Encounter for antibody response examination: Secondary | ICD-10-CM

## 2019-09-28 DIAGNOSIS — R000 Tachycardia, unspecified: Secondary | ICD-10-CM | POA: Diagnosis present

## 2019-09-28 DIAGNOSIS — B948 Sequelae of other specified infectious and parasitic diseases: Secondary | ICD-10-CM

## 2019-09-28 DIAGNOSIS — Z20822 Contact with and (suspected) exposure to covid-19: Secondary | ICD-10-CM | POA: Diagnosis present

## 2019-09-28 DIAGNOSIS — R0600 Dyspnea, unspecified: Secondary | ICD-10-CM

## 2019-09-28 DIAGNOSIS — Z113 Encounter for screening for infections with a predominantly sexual mode of transmission: Secondary | ICD-10-CM

## 2019-09-28 DIAGNOSIS — A419 Sepsis, unspecified organism: Principal | ICD-10-CM

## 2019-09-28 DIAGNOSIS — N760 Acute vaginitis: Secondary | ICD-10-CM

## 2019-09-28 DIAGNOSIS — E669 Obesity, unspecified: Secondary | ICD-10-CM | POA: Diagnosis present

## 2019-09-28 DIAGNOSIS — N179 Acute kidney failure, unspecified: Secondary | ICD-10-CM | POA: Diagnosis present

## 2019-09-28 LAB — POC URINE PREG, ED: Preg Test, Ur: NEGATIVE

## 2019-09-28 LAB — COMPREHENSIVE METABOLIC PANEL
ALT: 13 U/L (ref 0–44)
AST: 17 U/L (ref 15–41)
Albumin: 4.3 g/dL (ref 3.5–5.0)
Alkaline Phosphatase: 52 U/L (ref 38–126)
Anion gap: 11 (ref 5–15)
BUN: 11 mg/dL (ref 6–20)
CO2: 26 mmol/L (ref 22–32)
Calcium: 9.5 mg/dL (ref 8.9–10.3)
Chloride: 101 mmol/L (ref 98–111)
Creatinine, Ser: 1.04 mg/dL — ABNORMAL HIGH (ref 0.44–1.00)
GFR calc Af Amer: >60 mL/min (ref >60)
GFR calc non Af Amer: >60 mL/min (ref >60)
Glucose, Bld: 118 mg/dL — ABNORMAL HIGH (ref 70–99)
Potassium: 3.3 mmol/L — ABNORMAL LOW (ref 3.5–5.1)
Sodium: 138 mmol/L (ref 135–145)
Total Bilirubin: 1.4 mg/dL — ABNORMAL HIGH (ref 0.3–1.2)
Total Protein: 8.2 g/dL — ABNORMAL HIGH (ref 6.5–8.1)

## 2019-09-28 LAB — CBC WITH DIFFERENTIAL/PLATELET
Abs Immature Granulocytes: 0.09 10*3/uL — ABNORMAL HIGH (ref 0.00–0.07)
Basophils Absolute: 0.1 10*3/uL (ref 0.0–0.1)
Basophils Relative: 0 %
Eosinophils Absolute: 0 10*3/uL (ref 0.0–0.5)
Eosinophils Relative: 0 %
HCT: 40.7 % (ref 36.0–46.0)
Hemoglobin: 13.7 g/dL (ref 12.0–15.0)
Immature Granulocytes: 1 %
Lymphocytes Relative: 9 %
Lymphs Abs: 1.2 10*3/uL (ref 0.7–4.0)
MCH: 31 pg (ref 26.0–34.0)
MCHC: 33.7 g/dL (ref 30.0–36.0)
MCV: 92.1 fL (ref 80.0–100.0)
Monocytes Absolute: 0.9 10*3/uL (ref 0.1–1.0)
Monocytes Relative: 6 %
Neutro Abs: 11.5 10*3/uL — ABNORMAL HIGH (ref 1.7–7.7)
Neutrophils Relative %: 84 %
Platelets: 329 10*3/uL (ref 150–400)
RBC: 4.42 MIL/uL (ref 3.87–5.11)
RDW: 11.5 % (ref 11.5–15.5)
WBC: 13.7 10*3/uL — ABNORMAL HIGH (ref 4.0–10.5)
nRBC: 0 % (ref 0.0–0.2)

## 2019-09-28 LAB — APTT: aPTT: 31 seconds (ref 24–36)

## 2019-09-28 LAB — PROTIME-INR
INR: 1.1 (ref 0.8–1.2)
Prothrombin Time: 13.5 seconds (ref 11.4–15.2)

## 2019-09-28 LAB — BLOOD GAS, VENOUS
Acid-Base Excess: 2.3 mmol/L — ABNORMAL HIGH (ref 0.0–2.0)
Bicarbonate: 27.3 mmol/L (ref 20.0–28.0)
O2 Saturation: 68 %
Patient temperature: 37
pCO2, Ven: 43 mmHg — ABNORMAL LOW (ref 44.0–60.0)
pH, Ven: 7.41 (ref 7.250–7.430)
pO2, Ven: 35 mmHg (ref 32.0–45.0)

## 2019-09-28 LAB — FIBRINOGEN: Fibrinogen: 475 mg/dL (ref 210–475)

## 2019-09-28 LAB — SARS CORONAVIRUS 2 BY RT PCR (HOSPITAL ORDER, PERFORMED IN ~~LOC~~ HOSPITAL LAB): SARS Coronavirus 2: NEGATIVE

## 2019-09-28 LAB — URINALYSIS, ROUTINE W REFLEX MICROSCOPIC
Bilirubin Urine: NEGATIVE
Glucose, UA: NEGATIVE mg/dL
Hgb urine dipstick: NEGATIVE
Ketones, ur: NEGATIVE mg/dL
Leukocytes,Ua: NEGATIVE
Nitrite: NEGATIVE
Protein, ur: NEGATIVE mg/dL
Specific Gravity, Urine: 1.019 (ref 1.005–1.030)
pH: 5 (ref 5.0–8.0)

## 2019-09-28 LAB — LACTIC ACID, PLASMA: Lactic Acid, Venous: 1.8 mmol/L (ref 0.5–1.9)

## 2019-09-28 LAB — BRAIN NATRIURETIC PEPTIDE: B Natriuretic Peptide: 16.2 pg/mL (ref 0.0–100.0)

## 2019-09-28 MED ORDER — LORAZEPAM 2 MG/ML IJ SOLN
0.5000 mg | Freq: Once | INTRAMUSCULAR | Status: AC
  Administered 2019-09-28: 0.5 mg via INTRAVENOUS
  Filled 2019-09-28: qty 1

## 2019-09-28 MED ORDER — IOHEXOL 350 MG/ML SOLN
75.0000 mL | Freq: Once | INTRAVENOUS | Status: AC | PRN
  Administered 2019-09-28: 75 mL via INTRAVENOUS

## 2019-09-28 MED ORDER — LACTATED RINGERS IV BOLUS (SEPSIS)
1250.0000 mL | Freq: Once | INTRAVENOUS | Status: AC
  Administered 2019-09-28: 1250 mL via INTRAVENOUS

## 2019-09-28 MED ORDER — SODIUM CHLORIDE 0.9 % IV SOLN
2.0000 g | Freq: Once | INTRAVENOUS | Status: AC
  Administered 2019-09-28: 2 g via INTRAVENOUS

## 2019-09-28 MED ORDER — VANCOMYCIN HCL IN DEXTROSE 1-5 GM/200ML-% IV SOLN
1000.0000 mg | Freq: Once | INTRAVENOUS | Status: AC
  Administered 2019-09-29: 1000 mg via INTRAVENOUS
  Filled 2019-09-28: qty 200

## 2019-09-28 MED ORDER — SODIUM CHLORIDE 0.9 % IV SOLN: Freq: Once | INTRAVENOUS | Status: AC

## 2019-09-28 NOTE — ED Notes (Signed)
Pt remains in CT at this time, abx are at bedside, ready for admin when pt returns

## 2019-09-28 NOTE — ED Notes (Signed)
Pt to CT now

## 2019-09-28 NOTE — ED Provider Notes (Signed)
ER Provider Note       Time seen: 10:14 PM    I have reviewed the vital signs and the nursing notes.  HISTORY   Chief Complaint Shortness of Breath    HPI Jordan Jordan [name] is a 34 y.o. female with a history of COVID-19, seizures who presents today for shortness of breath and cough.  Patient presents febrile, tachycardic and dyspneic.  Was diagnosed with pneumonia 10 days ago.  She does follow a pulmonologist for lingering effects from Covid that she had last year.  She is still taking Levaquin for pneumonia.  She denies vomiting, diarrhea, dysuria or other complaints at this time.  Past Medical History:  Diagnosis Date  . Blood transfusion without reported diagnosis   . COVID-19 09/02/2018  . Seizures ([place])     History reviewed. No pertinent surgical history.  Allergies Patient has no known allergies.  Review of Systems Constitutional: Negative for fever. Cardiovascular: Negative for chest pain. Respiratory: Positive for shortness of breath, cough Gastrointestinal: Negative for abdominal pain, vomiting and diarrhea. Musculoskeletal: Negative for back pain. Skin: Negative for rash. Neurological: Negative for headaches, focal weakness or numbness.  All systems negative/normal/unremarkable except as stated in the HPI  ____________________________________________   PHYSICAL EXAM:  VITAL SIGNS: Vitals:   09/28/19 2205  BP: (!) 149/102  Pulse: (!) 158  Temp: (!) 101.4 F (38.6 C)  SpO2: 94%    Constitutional: Alert and oriented.  Mild distress Eyes: Conjunctivae are normal. Normal extraocular movements. ENT      Head: Normocephalic and atraumatic.      Nose: No congestion/rhinnorhea.      Mouth/Throat: Mucous membranes are moist.      Neck: No stridor. Cardiovascular: Rapid rate, regular rhythm. No murmurs, rubs, or gallops. Respiratory: Tachypnea with clear breath sounds Gastrointestinal: Soft and nontender. Normal bowel sounds Musculoskeletal:  Nontender with normal range of motion in extremities. No lower extremity tenderness nor edema. Neurologic:  Normal speech and language. No gross focal neurologic deficits are appreciated.  Skin:  Skin is warm, dry and intact. No rash noted. Psychiatric: Speech and behavior are normal.  ____________________________________________  EKG: Interpreted by me.  Sinus tachycardia with a rate of 137 bpm, normal PR interval, normal QRS, normal QT  ____________________________________________   LABS (pertinent positives/negatives)  Labs Reviewed  COMPREHENSIVE METABOLIC PANEL - Abnormal; Notable for the following components:      Result Value   Potassium 3.3 (*)    Glucose, Bld 118 (*)    Creatinine, Ser 1.04 (*)    Total Protein 8.2 (*)    Total Bilirubin 1.4 (*)    All other components within normal limits  CBC WITH DIFFERENTIAL/PLATELET - Abnormal; Notable for the following components:   WBC 13.7 (*)    Neutro Abs 11.5 (*)    Abs Immature Granulocytes 0.09 (*)    All other components within normal limits  URINALYSIS, ROUTINE W REFLEX MICROSCOPIC - Abnormal; Notable for the following components:   Color, Urine YELLOW (*)    APPearance CLOUDY (*)    All other components within normal limits  BLOOD GAS, VENOUS - Abnormal; Notable for the following components:   pCO2, Ven 43 (*)    Acid-Base Excess 2.3 (*)    All other components within normal limits  CULTURE, BLOOD (ROUTINE X 2)  CULTURE, BLOOD (ROUTINE X 2)  URINE CULTURE  SARS CORONAVIRUS 2 BY RT PCR (HOSPITAL ORDER, [facility])  LACTIC ACID, PLASMA  LACTIC ACID,  PLASMA  APTT  PROTIME-INR  SAR COV2 SEROLOGY (COVID19)AB(IGG),IA  POC URINE PREG, ED    RADIOLOGY  Images were viewed by me Chest x-ray IMPRESSION: Shallow inspiration with bibasilar atelectasis, less likely infiltrate.  DIFFERENTIAL DIAGNOSIS  Sepsis, pneumonia, COVID-19, dehydration, electrolyte abnormality,  pyelonephritis  ASSESSMENT AND Jordan [name]  Shortness of breath, cough   Jordan [name] The patient had presented for shortness of breath and cough with vital signs concerning for sepsis. Patient's labs are still pending at this time. Daryel November MD    Note: This note was generated in part or whole with voice recognition software. Voice recognition is usually quite accurate but there are transcription errors that can and very often do occur. I apologize for any typographical errors that were not detected and corrected.     Emily Filbert, MD 09/28/19 [phone]

## 2019-09-28 NOTE — ED Notes (Signed)
MD placed pt on 2LPM [place] at this time

## 2019-09-28 NOTE — ED Triage Notes (Signed)
Pt to triage via w/c, mask in place; Pt reports rx levaquin on 5/20 for pneumonia; tylenol ES taken PTA; pt c/o persistent SHOB and cough

## 2019-09-28 NOTE — ED Provider Notes (Signed)
-----------------------------------------   11:06 PM on 09/28/2019 -----------------------------------------  Assuming care from Dr. Mayford Knife.  In short, Jordan [name] is a 34 y.o. female with a chief complaint of SOB.  Refer to the original H&P for additional details.  The current plan of care is to follow up labs and CTA chest.  Anticipate admission given septic presentation with highly abnormal vital signs.   ----------------------------------------- 11:12 PM on 09/28/2019 -----------------------------------------  UA normal.  Lacticacid near upper limit of normal but still generally reassuring.  Patient received 1L NS bolus, but still tachy around 120.  Initiating sepsis protocol given her vital signs (febrile, tachycardiac, dyspnea, recent antibiotics for PNA as an outpatient).  Ordering another 1.25L LR to meet 30 mL/kg bolus goal, cefepime 2 g IV, and vancomycin per pharmacy protocol.  Marland Kitchen1-3 Lead EKG Interpretation Performed by: Loleta Rose, MD Authorized by: Loleta Rose, MD     Interpretation: abnormal     ECG rate:  118   ECG rate assessment: tachycardic     Rhythm: sinus tachycardia     Ectopy: none     Conduction: normal   .Critical Care Performed by: Loleta Rose, MD Authorized by: Loleta Rose, MD   Critical care provider statement:    Critical care time (minutes):  30   Critical care time was exclusive of:  Separately billable procedures and treating other patients   Critical care was necessary to treat or prevent imminent or life-threatening deterioration of the following conditions:  Sepsis   Critical care was time spent personally by me on the following activities:  Development of treatment plan with patient or surrogate, discussions with consultants, evaluation of patient's response to treatment, examination of patient, obtaining history from patient or surrogate, ordering and performing treatments and interventions, ordering and review of laboratory  studies, ordering and review of radiographic studies, pulse oximetry, re-evaluation of patient's condition and review of old charts    ----------------------------------------- 11:44 PM on 09/28/2019 -----------------------------------------  CTA notable for multi-focal pneumonia in bilateral lung bases, no evidence of PE.  Will consult hospitalist for admission.  Inflammatory markers are in process.   ----------------------------------------- 12:09 AM on 09/29/2019 -----------------------------------------  Discussed case with the hospitalist by phone.  He will admit.  Patient up to date as well and agrees with plan.  Never lower than 94% on monitor, but put patient on 2L [place] O2 for comfort (she has tachypnea at about 30 breaths/minute).   Loleta Rose, MD 09/29/19 [phone]

## 2019-09-29 ENCOUNTER — Encounter: Payer: Self-pay | Admitting: Obstetrics

## 2019-09-29 DIAGNOSIS — R000 Tachycardia, unspecified: Secondary | ICD-10-CM

## 2019-09-29 DIAGNOSIS — N760 Acute vaginitis: Secondary | ICD-10-CM | POA: Diagnosis present

## 2019-09-29 DIAGNOSIS — E669 Obesity, unspecified: Secondary | ICD-10-CM | POA: Diagnosis present

## 2019-09-29 DIAGNOSIS — N179 Acute kidney failure, unspecified: Secondary | ICD-10-CM | POA: Diagnosis present

## 2019-09-29 DIAGNOSIS — E876 Hypokalemia: Secondary | ICD-10-CM | POA: Diagnosis present

## 2019-09-29 DIAGNOSIS — J9601 Acute respiratory failure with hypoxia: Secondary | ICD-10-CM | POA: Diagnosis not present

## 2019-09-29 DIAGNOSIS — Z20822 Contact with and (suspected) exposure to covid-19: Secondary | ICD-10-CM | POA: Diagnosis present

## 2019-09-29 DIAGNOSIS — R6520 Severe sepsis without septic shock: Secondary | ICD-10-CM

## 2019-09-29 DIAGNOSIS — Z6826 Body mass index (BMI) 26.0-26.9, adult: Secondary | ICD-10-CM | POA: Diagnosis not present

## 2019-09-29 DIAGNOSIS — J189 Pneumonia, unspecified organism: Secondary | ICD-10-CM | POA: Diagnosis present

## 2019-09-29 DIAGNOSIS — J9621 Acute and chronic respiratory failure with hypoxia: Secondary | ICD-10-CM | POA: Diagnosis present

## 2019-09-29 DIAGNOSIS — Z0184 Encounter for antibody response examination: Secondary | ICD-10-CM | POA: Diagnosis not present

## 2019-09-29 DIAGNOSIS — R0602 Shortness of breath: Secondary | ICD-10-CM | POA: Diagnosis not present

## 2019-09-29 DIAGNOSIS — A419 Sepsis, unspecified organism: Secondary | ICD-10-CM | POA: Diagnosis present

## 2019-09-29 DIAGNOSIS — B948 Sequelae of other specified infectious and parasitic diseases: Secondary | ICD-10-CM | POA: Diagnosis not present

## 2019-09-29 DIAGNOSIS — Z79899 Other long term (current) drug therapy: Secondary | ICD-10-CM | POA: Diagnosis not present

## 2019-09-29 DIAGNOSIS — Y95 Nosocomial condition: Secondary | ICD-10-CM | POA: Diagnosis present

## 2019-09-29 LAB — COMPREHENSIVE METABOLIC PANEL
ALT: 9 U/L (ref 0–44)
AST: 15 U/L (ref 15–41)
Albumin: 3.1 g/dL — ABNORMAL LOW (ref 3.5–5.0)
Alkaline Phosphatase: 41 U/L (ref 38–126)
Anion gap: 7 (ref 5–15)
BUN: 10 mg/dL (ref 6–20)
CO2: 24 mmol/L (ref 22–32)
Calcium: 8.1 mg/dL — ABNORMAL LOW (ref 8.9–10.3)
Chloride: 109 mmol/L (ref 98–111)
Creatinine, Ser: 0.95 mg/dL (ref 0.44–1.00)
GFR calc Af Amer: >60 mL/min (ref >60)
GFR calc non Af Amer: >60 mL/min (ref >60)
Glucose, Bld: 120 mg/dL — ABNORMAL HIGH (ref 70–99)
Potassium: 3.5 mmol/L (ref 3.5–5.1)
Sodium: 140 mmol/L (ref 135–145)
Total Bilirubin: 1.5 mg/dL — ABNORMAL HIGH (ref 0.3–1.2)
Total Protein: 6.2 g/dL — ABNORMAL LOW (ref 6.5–8.1)

## 2019-09-29 LAB — EXPECTORATED SPUTUM ASSESSMENT W GRAM STAIN, RFLX TO RESP C

## 2019-09-29 LAB — TRIGLYCERIDES: Triglycerides: 42 mg/dL (ref <150)

## 2019-09-29 LAB — CYTOLOGY - PAP
Chlamydia: NEGATIVE
Comment: NEGATIVE
Comment: NEGATIVE
Comment: NEGATIVE
Comment: NORMAL
Diagnosis: UNDETERMINED — AB
High risk HPV: NEGATIVE
Neisseria Gonorrhea: NEGATIVE
Trichomonas: NEGATIVE

## 2019-09-29 LAB — LACTATE DEHYDROGENASE: LDH: 152 U/L (ref 98–192)

## 2019-09-29 LAB — SAR COV2 SEROLOGY (COVID19)AB(IGG),IA: SARS-CoV-2 Ab, IgG: REACTIVE — AB

## 2019-09-29 LAB — CBC
HCT: 33 % — ABNORMAL LOW (ref 36.0–46.0)
Hemoglobin: 11.3 g/dL — ABNORMAL LOW (ref 12.0–15.0)
MCH: 31.6 pg (ref 26.0–34.0)
MCHC: 34.2 g/dL (ref 30.0–36.0)
MCV: 92.2 fL (ref 80.0–100.0)
Platelets: 276 10*3/uL (ref 150–400)
RBC: 3.58 MIL/uL — ABNORMAL LOW (ref 3.87–5.11)
RDW: 11.5 % (ref 11.5–15.5)
WBC: 14.3 10*3/uL — ABNORMAL HIGH (ref 4.0–10.5)
nRBC: 0 % (ref 0.0–0.2)

## 2019-09-29 LAB — LACTIC ACID, PLASMA: Lactic Acid, Venous: 1.5 mmol/L (ref 0.5–1.9)

## 2019-09-29 LAB — FERRITIN: Ferritin: 31 ng/mL (ref 11–307)

## 2019-09-29 LAB — FIBRIN DERIVATIVES D-DIMER (ARMC ONLY): Fibrin derivatives D-dimer (ARMC): 466.6 ng/mL (FEU) (ref 0.00–499.00)

## 2019-09-29 LAB — PROCALCITONIN: Procalcitonin: 0.84 ng/mL

## 2019-09-29 LAB — STREP PNEUMONIAE URINARY ANTIGEN: Strep Pneumo Urinary Antigen: NEGATIVE

## 2019-09-29 LAB — C-REACTIVE PROTEIN: CRP: 0.7 mg/dL (ref <1.0)

## 2019-09-29 LAB — TROPONIN I (HIGH SENSITIVITY): Troponin I (High Sensitivity): 3 ng/L (ref <18)

## 2019-09-29 MED ORDER — SODIUM CHLORIDE 0.9 % IV SOLN: INTRAVENOUS | Status: DC

## 2019-09-29 MED ORDER — SODIUM CHLORIDE 0.9 % IV SOLN
2.0000 g | Freq: Three times a day (TID) | INTRAVENOUS | Status: DC
  Administered 2019-09-29 – 2019-10-02 (×10): 2 g via INTRAVENOUS
  Filled 2019-09-29 (×12): qty 2

## 2019-09-29 MED ORDER — KETOROLAC TROMETHAMINE 30 MG/ML IJ SOLN: 30.0000 mg | Freq: Once | INTRAMUSCULAR | Status: AC

## 2019-09-29 MED ORDER — ACETAMINOPHEN 325 MG PO TABS
650.0000 mg | ORAL_TABLET | Freq: Four times a day (QID) | ORAL | Status: DC | PRN
  Administered 2019-09-29: 650 mg via ORAL
  Filled 2019-09-29 (×2): qty 2

## 2019-09-29 MED ORDER — VANCOMYCIN HCL 750 MG/150ML IV SOLN
750.0000 mg | Freq: Once | INTRAVENOUS | Status: AC
  Administered 2019-09-29: 750 mg via INTRAVENOUS
  Filled 2019-09-29: qty 150

## 2019-09-29 MED ORDER — KETOROLAC TROMETHAMINE 30 MG/ML IJ SOLN
INTRAMUSCULAR | Status: AC
  Administered 2019-09-29: 30 mg via INTRAVENOUS
  Filled 2019-09-29: qty 1

## 2019-09-29 MED ORDER — TRAZODONE HCL 50 MG PO TABS
25.0000 mg | ORAL_TABLET | Freq: Every evening | ORAL | Status: DC | PRN
  Administered 2019-09-29: 25 mg via ORAL
  Filled 2019-09-29 (×3): qty 1

## 2019-09-29 MED ORDER — VANCOMYCIN HCL IN DEXTROSE 1-5 GM/200ML-% IV SOLN
1000.0000 mg | Freq: Three times a day (TID) | INTRAVENOUS | Status: DC
  Administered 2019-09-29 – 2019-09-30 (×4): 1000 mg via INTRAVENOUS
  Filled 2019-09-29 (×7): qty 200

## 2019-09-29 MED ORDER — ENOXAPARIN SODIUM 40 MG/0.4ML ~~LOC~~ SOLN
40.0000 mg | SUBCUTANEOUS | Status: DC
  Administered 2019-09-29 – 2019-10-02 (×4): 40 mg via SUBCUTANEOUS
  Filled 2019-09-29 (×4): qty 0.4

## 2019-09-29 NOTE — ED Notes (Signed)
Pt given breakfast tray

## 2019-09-29 NOTE — Progress Notes (Signed)
Pharmacy Antibiotic Note  Jordan [name] is a 34 y.o. female admitted on 09/28/2019 with SOB s/t multifocal pulmonary infiltrates.  Pharmacy has been consulted for vanc/cefepime dosing.  Plan: Patient received vanc 1.75g IV load and cefepime 2g IV x 1 in the ED  Will continue patient on vanc 1g IV q8h and will check vanc trough 06/02 @ 0800 prior to 4th dose.  T1/2 8.7 ~ 8hrs Ke 0.0796 Goal trough 15 - 20 mcg/mL  Will continue cefepime 2g IV q8h per CrCl > 60 ml/min and will continue to monitor and adjust as necessary.   Height: 5\' 7"  (170.2 cm) Weight: 74.8 kg (165 lb) IBW/kg (Calculated) : 61.6  Temp (24hrs), Avg:100.2 F (37.9 C), Min:98.9 F (37.2 C), Max:101.4 F (38.6 C)  Recent Labs  Lab 09/28/19 2222 09/29/19 0022 09/29/19 0444  WBC 13.7*  --  14.3*  CREATININE 1.04*  --  0.95  LATICACIDVEN 1.8 1.5  --     Estimated Creatinine Clearance: 90.6 mL/min (by C-G formula based on SCr of 0.95 mg/dL).    No Known Allergies  Thank you for allowing pharmacy to be a part of this patient's care.  11/29/19, PharmD, BCPS Clinical Pharmacist 09/29/2019 5:26 AM

## 2019-09-29 NOTE — ED Notes (Signed)
Pt complains of headache at this time, mother at bedside. NP Ouma notified.

## 2019-09-29 NOTE — Progress Notes (Signed)
Briefly patient is a 34 year old female with past medical history significant for seizure disorder was admitted earlier today for H CAP.  Patient states that she has been sick on and off since she was diagnosed with Covid a year ago.  Patient states that she feels short of breath at baseline however last week developed malaise myalgias and "I felt like I had Covid again".  Covid PCR was negative at the time and she was treated with Levaquin.  Patient was admitted yesterday with ongoing symptoms.  CTA showed multifocal patchy airspace disease in both lungs.  No pulmonary emboli.  Patient was started on cefepime and vancomycin for H CAP.

## 2019-09-29 NOTE — ED Notes (Signed)
Pt transported to 235-2A by EDT Jeb at this time. Pt informed mom of room assignment

## 2019-09-29 NOTE — Progress Notes (Signed)
PHARMACY -  BRIEF ANTIBIOTIC NOTE   Pharmacy has received consult(s) for vancomycin/cefepime from an ED provider.  The patient's profile has been reviewed for ht/wt/allergies/indication/available labs.    One time order(s) placed for vanc 1.75g IV load + cefepime 2g IV x 1  Further antibiotics/pharmacy consults should be ordered by admitting physician if indicated.                       Thank you,  Thomasene Ripple, PharmD, BCPS Clinical Pharmacist 09/29/2019  12:51 AM

## 2019-09-29 NOTE — ED Notes (Signed)
Pt ambulatory to toilet at this time 

## 2019-09-29 NOTE — ED Notes (Signed)
Admission MD at bedside at this time

## 2019-09-29 NOTE — H&P (Signed)
History and Physical   Jordan [name] JKK:[id number] DOB: 23-Nov-1985 DOA: 09/28/2019  Referring MD/NP/PA: Dr. Mayford Knife  PCP: Marya Fossa, PA-C   Outpatient Specialists: None  Patient coming from: Home  Chief Complaint: Shortness of breath  HPI: Jordan [name] is a 34 y.o. female with medical history significant of seizures otherwise no significant history until she had COVID-19 infection about a year ago.  Since then patient has had multiple problems with respiration and tachycardia.  Came to the ER again with fever chills shortness of breath and cough.  Also palpitations.  Patient seen in the ER and evaluated.  Retesting for COVID-19 PCR was negative.  Patient had chest x-ray and CT scan that has shown multifocal pneumonia.  It appears she has bilateral pneumonia which has not responded to outpatient therapy.  She was on Levaquin for 10 days at home which seems not to control her symptoms.  Patient is therefore presumed to have healthcare associated pneumonia probably post Covid pneumonia.  Is being admitted to the hospital for further work-up.  Denied any reexposure.  Denied any new symptoms other than the respiratory.  No aspiration symptoms..  ED Course: Temperature 101.4 blood pressure 149/102 pulse 150 respirate 24 oxygen sats 94% on 2 L.  White count is 13.7 the rest of the CBC within normal potassium 3.3 and creatinine 1.04 glucose 118 the rest of the chemistry within normal lactic acid 1.8.  BNP of 16 triglyceride 42 and LDH 152.  Troponin is negative.  Chest x-ray shows shallow inspiration with bibasilar atelectasis probably infiltrate.  CT angiogram of the chest showed no PE.  Multifocal patchy airspace opacities at both lung bases is noted.  She is being admitted for treatment of healthcare associated pneumonia.  COVID-19 screen is negative.  Review of Systems: As per HPI otherwise 10 point review of systems negative.    Past Medical History:  Diagnosis Date  . Blood  transfusion without reported diagnosis   . COVID-19 09/02/2018  . Seizures (HCC)     History reviewed. No pertinent surgical history.   reports that she has never smoked. She has never used smokeless tobacco. She reports that she does not drink alcohol or use drugs.  No Known Allergies  Family History  Problem Relation Age of Onset  . Healthy Mother   . Migraines Mother   . Healthy Father      Prior to Admission medications   Medication Sig Start Date End Date Taking? Authorizing Provider  albuterol (PROVENTIL) (2.5 MG/3ML) 0.083% nebulizer solution Take 3 mLs (2.5 mg total) by nebulization every 4 (four) hours as needed for wheezing or shortness of breath. 03/16/19   Irean Hong, MD  albuterol (VENTOLIN HFA) 108 (90 Base) MCG/ACT inhaler Inhale 2 puffs into the lungs every 6 (six) hours as needed for wheezing or shortness of breath. 08/31/18   Phineas Semen, MD  chlorpheniramine-HYDROcodone [facility] ER) 10-8 MG/5ML SUER Take 5 mLs by mouth 2 (two) times daily. Patient not taking: Reported on 09/23/2019 03/16/19   Irean Hong, MD  Nebulizers (COMPRESSOR/NEBULIZER) MISC 1 Units by Does not apply route every 4 (four) hours as needed. Patient not taking: Reported on 09/23/2019 03/16/19   Irean Hong, MD    Physical Exam: Vitals:   09/28/19 2205 09/28/19 2206 09/29/19 0000  BP: (!) 149/102  (!) 135/98  Pulse: (!) 158  (!) 121  Resp:   (!) 24  Temp: (!) 101.4 F (38.6 C)    TempSrc: Oral  SpO2: 94%  98%  Weight:  74.8 kg   Height:  5\' 7"  (1.702 m)       Constitutional: Anxious, no acute distress, acutely ill looking, obese Vitals:   09/28/19 2205 09/28/19 2206 09/29/19 0000  BP: (!) 149/102  (!) 135/98  Pulse: (!) 158  (!) 121  Resp:   (!) 24  Temp: (!) 101.4 F (38.6 C)    TempSrc: Oral    SpO2: 94%  98%  Weight:  74.8 kg   Height:  5\' 7"  (1.702 m)    Eyes: PERRL, lids and conjunctivae normal ENMT: Mucous membranes are dry. Posterior pharynx  clear of any exudate or lesions.Normal dentition.  Neck: normal, supple, no masses, no thyromegaly Respiratory: Coarse breath sounds with significant rhonchi bilaterally, no wheezing, no crackles. Normal respiratory effort. No accessory muscle use.  Cardiovascular: Sinus tachycardia, no murmurs / rubs / gallops. No extremity edema. 2+ pedal pulses. No carotid bruits.  Abdomen: no tenderness, no masses palpated. No hepatosplenomegaly. Bowel sounds positive.  Musculoskeletal: no clubbing / cyanosis. No joint deformity upper and lower extremities. Good ROM, no contractures. Normal muscle tone.  Skin: no rashes, lesions, ulcers. No induration Neurologic: CN 2-12 grossly intact. Sensation intact, DTR normal. Strength 5/5 in all 4.  Psychiatric: Normal judgment and insight. Alert and oriented x 3. Normal mood.     Labs on Admission: I have personally reviewed following labs and imaging studies  CBC: Recent Labs  Lab 09/28/19 2222  WBC 13.7*  NEUTROABS 11.5*  HGB 13.7  HCT 40.7  MCV 92.1  PLT 338   Basic Metabolic Panel: Recent Labs  Lab 09/28/19 2222  NA 138  K 3.3*  CL 101  CO2 26  GLUCOSE 118*  BUN 11  CREATININE 1.04*  CALCIUM 9.5   GFR: Estimated Creatinine Clearance: 82.8 mL/min (A) (by C-G formula based on SCr of 1.04 mg/dL (H)). Liver Function Tests: Recent Labs  Lab 09/28/19 2222  AST 17  ALT 13  ALKPHOS 52  BILITOT 1.4*  PROT 8.2*  ALBUMIN 4.3   No results for input(s): LIPASE, AMYLASE in the last 168 hours. No results for input(s): AMMONIA in the last 168 hours. Coagulation Profile: Recent Labs  Lab 09/28/19 2222  INR 1.1   Cardiac Enzymes: No results for input(s): CKTOTAL, CKMB, CKMBINDEX, TROPONINI in the last 168 hours. BNP (last 3 results) No results for input(s): PROBNP in the last 8760 hours. HbA1C: No results for input(s): HGBA1C in the last 72 hours. CBG: No results for input(s): GLUCAP in the last 168 hours. Lipid Profile: Recent Labs      09/28/19 2222  TRIG 42   Thyroid Function Tests: No results for input(s): TSH, T4TOTAL, FREET4, T3FREE, THYROIDAB in the last 72 hours. Anemia Panel: Recent Labs    09/28/19 2222  FERRITIN 31   Urine analysis:    Component Value Date/Time   COLORURINE YELLOW (A) 09/28/2019 2222   APPEARANCEUR CLOUDY (A) 09/28/2019 2222   LABSPEC 1.019 09/28/2019 2222   PHURINE 5.0 09/28/2019 2222   GLUCOSEU NEGATIVE 09/28/2019 2222   HGBUR NEGATIVE 09/28/2019 2222   BILIRUBINUR NEGATIVE 09/28/2019 2222   KETONESUR NEGATIVE 09/28/2019 2222   PROTEINUR NEGATIVE 09/28/2019 2222   UROBILINOGEN 1.0 06/12/2013 1359   NITRITE NEGATIVE 09/28/2019 2222   LEUKOCYTESUR NEGATIVE 09/28/2019 2222   Sepsis Labs: @LABRCNTIP (procalcitonin:4,lacticidven:4) ) Recent Results (from the past 240 hour(s))  SARS Coronavirus 2 by RT PCR (hospital order, performed in [facility] hospital lab) Nasopharyngeal  Status: None   Collection Time: 09/28/19 10:22 PM   Specimen: Nasopharyngeal  Result Value Ref Range Status   SARS Coronavirus 2 NEGATIVE NEGATIVE Final    Comment: (NOTE) SARS-CoV-2 target nucleic acids are NOT DETECTED. The SARS-CoV-2 RNA is generally detectable in upper and lower respiratory specimens during the acute phase of infection. The lowest concentration of SARS-CoV-2 viral copies this assay can detect is 250 copies / mL. A negative result does not preclude SARS-CoV-2 infection and should not be used as the sole basis for treatment or other patient management decisions.  A negative result may occur with improper specimen collection / handling, submission of specimen other than nasopharyngeal swab, presence of viral mutation(s) within the areas targeted by this assay, and inadequate number of viral copies (<250 copies / mL). A negative result must be combined with clinical observations, patient history, and epidemiological information. Fact Sheet for Patients:    BoilerBrush.com.cy Fact Sheet for Healthcare Providers: https://pope.com/ This test is not yet approved or cleared  by the [place] FDA and has been authorized for detection and/or diagnosis of SARS-CoV-2 by FDA under an Emergency Use Authorization (EUA).  This EUA will remain in effect (meaning this test can be used) for the duration of the COVID-19 declaration under Section 564(b)(1) of the Act, 21 U.S.C. section 360bbb-3(b)(1), unless the authorization is terminated or revoked sooner. Performed at [facility], [address] [place], [place] [address]      Radiological Exams on Admission: CT Angio Chest PE W and/or Wo Contrast  Result Date: 09/28/2019 CLINICAL DATA:  Shortness of breath and cough, history of COVID EXAM: CT ANGIOGRAPHY CHEST WITH CONTRAST TECHNIQUE: Multidetector CT imaging of the chest was performed using the standard protocol during bolus administration of intravenous contrast. Multiplanar CT image reconstructions and MIPs were obtained to evaluate the vascular anatomy. CONTRAST:  30mL OMNIPAQUE IOHEXOL 350 MG/ML SOLN COMPARISON:  March 16, 2019 FINDINGS: Cardiovascular: There is a optimal opacification of the pulmonary arteries. There is no central,segmental, or subsegmental filling defects within the pulmonary arteries. The heart is normal in size. No pericardial effusion or thickening. No evidence right heart strain. There is normal three-vessel brachiocephalic anatomy without proximal stenosis. The thoracic aorta is normal in appearance. Mediastinum/Nodes: Scattered pre-vascular and subcarinal lymph nodes are seen, likely reactive. No hilar axillary adenopathy. Thyroid gland, trachea, and esophagus demonstrate no significant findings. Lungs/Pleura: Multifocal patchy airspace opacities are seen at both lung bases. There is a trace bilateral pleural effusions. Upper Abdomen: No acute abnormalities present in  the visualized portions of the upper abdomen. Musculoskeletal: No chest wall abnormality. No acute or significant osseous findings. Review of the MIP images confirms the above findings. IMPRESSION: No central, segmental, or subsegmental pulmonary embolism. Multifocal patchy airspace opacities at both lung bases which may be due to multifocal infectious etiology. Electronically Signed   By: Jonna Clark M.D.   On: 09/28/2019 23:35   DG Chest Port 1 View  Result Date: 09/28/2019 CLINICAL DATA:  34 year old female with shortness of breath. EXAM: PORTABLE CHEST 1 VIEW COMPARISON:  Chest radiograph dated 07/04/2019. FINDINGS: Shallow inspiration with bibasilar atelectasis. Pneumonia is less likely. Clinical correlation is recommended. There is no pleural effusion or pneumothorax. The cardiac silhouette is within limits. No acute osseous pathology. IMPRESSION: Shallow inspiration with bibasilar atelectasis, less likely infiltrate. Electronically Signed   By: Elgie Collard M.D.   On: 09/28/2019 22:44    EKG: Independently reviewed.  Shows sinus tachycardia with wandering leads r.  Assessment/Plan Principal Problem:   Sepsis (HCC) Active Problems:   Healthcare-associated pneumonia   Sinus tachycardia   Hypokalemia     #1 sepsis: Due to pneumonia.  Early symptoms of severe sepsis.  Continue with sepsis protocol with IV fluids.  Initiate IV antibiotics and supportive care.  #2 multifocal pneumonia: Suspected secondary to pneumonia with healthcare associated setting.  She has been in and out of healthcare setting since her COVID-19 infection.  She has failed outpatient Levaquin.  Probably more resistant bacteria including MRSA and Pseudomonas.  Continue monitoring.  Vancomycin cefepime initiated.  #3 sinus tachycardia: Secondary to her sepsis.  Continue fluid resuscitation.  #4 hypokalemia: Replete potassium   DVT prophylaxis: Lovenox Code Status: Full code Family Communication: No family at  bedside Disposition Plan: Home Consults called: None Admission status: Inpatient  Severity of Illness: The appropriate patient status for this patient is INPATIENT. Inpatient status is judged to be reasonable and necessary in order to provide the required intensity of service to ensure the patient's safety. The patient's presenting symptoms, physical exam findings, and initial radiographic and laboratory data in the context of their chronic comorbidities is felt to place them at high risk for further clinical deterioration. Furthermore, it is not anticipated that the patient will be medically stable for discharge from the hospital within 2 midnights of admission. The following factors support the patient status of inpatient.   " The patient's presenting symptoms include shortness of breath and cough. " The worrisome physical exam findings include coarse breath sound bilaterally. " The initial radiographic and laboratory data are worrisome because of bilateral multifocal pneumonia. " The chronic co-morbidities include previous COVID-19 infection.   * I certify that at the point of admission it is my clinical judgment that the patient will require inpatient hospital care spanning beyond 2 midnights from the point of admission due to high intensity of service, high risk for further deterioration and high frequency of surveillance required.Lonia Blood MD [facility] Pager [phone]  If 7PM-7AM, please contact night-coverage [email or web address] Password [facility]  09/29/2019, 12:19 AM

## 2019-09-29 NOTE — ED Notes (Signed)
Admit MD Mikeal Hawthorne messaged regarding pt' request for diflucan.

## 2019-09-29 NOTE — Progress Notes (Signed)
Patient requested for Tylenol for pain but none is ordered. Notified Jordan Silversmith NP and received an  order for it. Will administer and continue to monitor.

## 2019-09-29 NOTE — ED Notes (Signed)
Lab contacted this nurse for recollect of sputum sample. States the sputum collected several hours ago was not acceptable per the machine that the lab uses. Pt provides repeat sputum sample and sample sent to lab at this time.

## 2019-09-29 NOTE — ED Notes (Addendum)
Attempted to call for pt's room assignment. Per Lillia Abed they are trying to figure out why the pt is on contact precautions and waiting reply from MD. Provided information in chart stating pt has a reactive result for COVID.  Ascom number provided and will wait for call back. Will inform RN [employer]

## 2019-09-29 NOTE — ED Notes (Signed)
Messaged Admit Jordan [name] regarding pt's plan of care and status.

## 2019-09-30 LAB — BASIC METABOLIC PANEL
Anion gap: 9 (ref 5–15)
BUN: 8 mg/dL (ref 6–20)
CO2: 28 mmol/L (ref 22–32)
Calcium: 8.9 mg/dL (ref 8.9–10.3)
Chloride: 104 mmol/L (ref 98–111)
Creatinine, Ser: 0.98 mg/dL (ref 0.44–1.00)
GFR calc Af Amer: >60 mL/min (ref >60)
GFR calc non Af Amer: >60 mL/min (ref >60)
Glucose, Bld: 97 mg/dL (ref 70–99)
Potassium: 3.8 mmol/L (ref 3.5–5.1)
Sodium: 141 mmol/L (ref 135–145)

## 2019-09-30 LAB — CBC
HCT: 36.3 % (ref 36.0–46.0)
Hemoglobin: 12.1 g/dL (ref 12.0–15.0)
MCH: 30.9 pg (ref 26.0–34.0)
MCHC: 33.3 g/dL (ref 30.0–36.0)
MCV: 92.8 fL (ref 80.0–100.0)
Platelets: 304 10*3/uL (ref 150–400)
RBC: 3.91 MIL/uL (ref 3.87–5.11)
RDW: 11.9 % (ref 11.5–15.5)
WBC: 6.6 10*3/uL (ref 4.0–10.5)
nRBC: 0 % (ref 0.0–0.2)

## 2019-09-30 LAB — VANCOMYCIN, TROUGH: Vancomycin Tr: 24 ug/mL (ref 15–20)

## 2019-09-30 LAB — URINE CULTURE: Culture: NO GROWTH

## 2019-09-30 LAB — MRSA PCR SCREENING: MRSA by PCR: NEGATIVE

## 2019-09-30 LAB — HIV ANTIBODY (ROUTINE TESTING W REFLEX): HIV Screen 4th Generation wRfx: NONREACTIVE

## 2019-09-30 MED ORDER — FUROSEMIDE 10 MG/ML IJ SOLN
40.0000 mg | Freq: Every day | INTRAMUSCULAR | Status: DC
  Administered 2019-09-30 – 2019-10-02 (×3): 40 mg via INTRAVENOUS
  Filled 2019-09-30 (×2): qty 4

## 2019-09-30 MED ORDER — FLUCONAZOLE 50 MG PO TABS
150.0000 mg | ORAL_TABLET | Freq: Once | ORAL | Status: AC
  Administered 2019-09-30: 150 mg via ORAL
  Filled 2019-09-30: qty 1

## 2019-09-30 NOTE — Progress Notes (Signed)
PROGRESS NOTE    Jordan [name]   LOV:[id number]  DOB: 08-18-1985  PCP: Marya Fossa, PA-C    DOA: 09/28/2019 LOS: 1   Brief Narrative   Jordan [name] is a 34 y.o. female with medical history significant of seizures, COVID-19 infection about one year ago with frequent respiratory infections, tachycardia and persistent fatigue since then.  Presented to the ED on 09/28/19 with fevers, chills, shortness of breath and cough.  Patient had a repeat Covid test which was negative.  Chest x-ray showed shallow inspiration with bibasilar atelectasis versus less likely infiltrates.  CTA chest was negative for PE but showed multifocal patchy airspace opacities in both lung bases.  Of note, patient had recently completed 10-day course of Levaquin as outpatient without improvement in her symptoms.  In the ED, febrile 101.4 F, hypertensive 149/101, tachycardic heart rate 150, tachypneic respirations 24, oxygen sat 94% on 2 L/min labs notable for leukocytosis 13.7k, normal lactic acid.  Admitted to hospitalist service for further management of healthcare associated pneumonia, post Covid-19 infection.       Assessment & Plan   Principal Problem:   Sepsis (HCC) Active Problems:   Healthcare-associated pneumonia   Sinus tachycardia   Hypokalemia   Sepsis secondary to multifocal pneumonia - Sepsis present on admission with fever, tachycardia, tachypnea, leukocytosis in setting of pneumonia.  No end organ dysfunction, normal lactic acid.  Treated per sepsis protocol with IV fluids and antibiotics in the ED.  Failed 10 day outpatient Levaquin course.  Treating as HCAP. --MRSA screen negative, d/c Vancomycin --continue Cefepime  --d/c IV fluids --consult pulmonology given recurrent respiratory issues since Covid infection --incentive spirometry, encourage frequent use  Hypokalemia -present on admission, replaced.  Monitor daily, replace as needed.  Sinus tachycardia -present on admission,  secondary to sepsis.  Monitor.  History of Seizures - none recent, and no acute issues.   Bacterial Vaginosis -recently diagnosed outpatient and prescribed intravaginal Flagyl cream which she had not started.  Likely this will be covered by antibiotics above.  Diflucan per patient request, reports yeast infections with use of antibiotics.  Need to bilateral ago blood   DVT prophylaxis: Lovenox  Diet:  Diet Orders (From admission, onward)    Start     Ordered   09/29/19 0101  Diet regular Room service appropriate? Yes; Fluid consistency: Thin  Diet effective now    Question Answer Comment  Room service appropriate? Yes   Fluid consistency: Thin      09/29/19 0100            Code Status: Full Code    Subjective 09/30/19    Patient seen with mother at bedside this morning.  No acute events reported overnight.  Patient reports that since having Covid a year ago she only feels about 40 to 50% back to her normal baseline.  Reports nearly constant fatigue and frequent respiratory infections since then.  Reports shaking chills this morning and nonproductive cough.  No fevers or chills morning.   Disposition Plan & Communication   Status is: Inpatient  Remains inpatient appropriate because:IV treatments appropriate due to intensity of illness or inability to take PO   Dispo: The patient is from: Home              Anticipated d/c is to: Home              Anticipated d/c date is: 2 days  Patient currently is not medically stable to d/c.         Family Communication: Mother at bedside during encounter, updated all questions answered   Consults, Procedures, Significant Events   Consultants:   Pulmonology  Procedures:   None  Antimicrobials:   Vancomycin 6/1 >> 6/2  Cefepime 6/1 >>   Objective   Vitals:   09/29/19 2203 09/30/19 0544 09/30/19 0736 09/30/19 1205  BP:  (!) 103/91 (!) 122/98 123/90  Pulse:  90 100 (!) 102  Resp: (!) 29 (!) 28 20 20     Temp:  98.4 F (36.9 C) 98.3 F (36.8 C) 98.5 F (36.9 C)  TempSrc:  Oral Oral Oral  SpO2:  97% 96% 98%  Weight:  75.3 kg    Height:        Intake/Output Summary (Last 24 hours) at 09/30/2019 1441 Last data filed at 09/30/2019 1229 Gross per 24 hour  Intake 2286.37 ml  Output 1350 ml  Net 936.37 ml   Filed Weights   09/29/19 0759 09/29/19 1116 09/30/19 0544  Weight: 74.8 kg 76.1 kg 75.3 kg    Physical Exam:  General exam: awake, alert, no acute distress HEENT: moist mucus membranes, hearing grossly normal  Respiratory system: Significantly decreased breath sounds, no wheezes, rales or rhonchi, normal respiratory effort. Cardiovascular system: normal S1/S2, RRR, no pedal edema.   Gastrointestinal system: soft, NT, ND, +bowel sounds. Central nervous system: A&O x4. no gross focal neurologic deficits, normal speech Extremities: moves all, normal tone Skin: dry, intact, normal temperature Psychiatry: normal mood, congruent affect, judgement and insight appear normal  Labs   Data Reviewed: I have personally reviewed following labs and imaging studies  CBC: Recent Labs  Lab 09/28/19 2222 09/29/19 0444 09/30/19 0754  WBC 13.7* 14.3* 6.6  NEUTROABS 11.5*  --   --   HGB 13.7 11.3* 12.1  HCT 40.7 33.0* 36.3  MCV 92.1 92.2 92.8  PLT 329 276 304   Basic Metabolic Panel: Recent Labs  Lab 09/28/19 2222 09/29/19 0444 09/30/19 0754  NA 138 140 141  K 3.3* 3.5 3.8  CL 101 109 104  CO2 26 24 28   GLUCOSE 118* 120* 97  BUN 11 10 8   CREATININE 1.04* 0.95 0.98  CALCIUM 9.5 8.1* 8.9   GFR: Estimated Creatinine Clearance: 88.1 mL/min (by C-G formula based on SCr of 0.98 mg/dL). Liver Function Tests: Recent Labs  Lab 09/28/19 2222 09/29/19 0444  AST 17 15  ALT 13 9  ALKPHOS 52 41  BILITOT 1.4* 1.5*  PROT 8.2* 6.2*  ALBUMIN 4.3 3.1*   No results for input(s): LIPASE, AMYLASE in the last 168 hours. No results for input(s): AMMONIA in the last 168 hours. Coagulation  Profile: Recent Labs  Lab 09/28/19 2222  INR 1.1   Cardiac Enzymes: No results for input(s): CKTOTAL, CKMB, CKMBINDEX, TROPONINI in the last 168 hours. BNP (last 3 results) No results for input(s): PROBNP in the last 8760 hours. HbA1C: No results for input(s): HGBA1C in the last 72 hours. CBG: No results for input(s): GLUCAP in the last 168 hours. Lipid Profile: Recent Labs    09/28/19 2222  TRIG 42   Thyroid Function Tests: No results for input(s): TSH, T4TOTAL, FREET4, T3FREE, THYROIDAB in the last 72 hours. Anemia Panel: Recent Labs    09/28/19 2222  FERRITIN 31   Sepsis Labs: Recent Labs  Lab 09/28/19 2222 09/29/19 0022  PROCALCITON 0.84  --   LATICACIDVEN 1.8 1.5    Recent Results (  from the past 240 hour(s))  Blood Culture (routine x 2)     Status: None (Preliminary result)   Collection Time: 09/28/19 10:22 PM   Specimen: BLOOD RIGHT FOREARM  Result Value Ref Range Status   Specimen Description BLOOD RIGHT FOREARM  Final   Special Requests   Final    BOTTLES DRAWN AEROBIC AND ANAEROBIC Blood Culture adequate volume   Culture   Final    NO GROWTH 2 DAYS Performed at [facility], [address], [place], [place] [address]    Report Status PENDING  Incomplete  Urine culture     Status: None   Collection Time: 09/28/19 10:22 PM   Specimen: In/Out Cath Urine  Result Value Ref Range Status   Specimen Description   Final    IN/OUT CATH URINE Performed at [facility] [address], [place], [place] [address]    Special Requests   Final    NONE Performed at [facility], [address] [place], [place] [address]    Culture   Final    NO GROWTH Performed at [facility], [place] [address] [place], [place] [address]    Report Status 09/30/2019 FINAL  Final  SARS Coronavirus 2 by RT PCR (hospital order, performed in [facility] hospital lab) Nasopharyngeal     Status: None   Collection Time: 09/28/19 10:22 PM    Specimen: Nasopharyngeal  Result Value Ref Range Status   SARS Coronavirus 2 NEGATIVE NEGATIVE Final    Comment: (NOTE) SARS-CoV-2 target nucleic acids are NOT DETECTED. The SARS-CoV-2 RNA is generally detectable in upper and lower respiratory specimens during the acute phase of infection. The lowest concentration of SARS-CoV-2 viral copies this assay can detect is 250 copies / mL. A negative result does not preclude SARS-CoV-2 infection and should not be used as the sole basis for treatment or other patient management decisions.  A negative result may occur with improper specimen collection / handling, submission of specimen other than nasopharyngeal swab, presence of viral mutation(s) within the areas targeted by this assay, and inadequate number of viral copies (<250 copies / mL). A negative result must be combined with clinical observations, patient history, and epidemiological information. Fact Sheet for Patients:   StrictlyIdeas.no Fact Sheet for Healthcare Providers: BankingDealers.co.za This test is not yet approved or cleared  by the [place] FDA and has been authorized for detection and/or diagnosis of SARS-CoV-2 by FDA under an Emergency Use Authorization (EUA).  This EUA will remain in effect (meaning this test can be used) for the duration of the COVID-19 declaration under Section 564(b)(1) of the Act, 21 U.S.C. section 360bbb-3(b)(1), unless the authorization is terminated or revoked sooner. Performed at [facility], [address] [place], [place] [address]   Blood Culture (routine x 2)     Status: None (Preliminary result)   Collection Time: 09/28/19 10:27 PM   Specimen: BLOOD LEFT FOREARM  Result Value Ref Range Status   Specimen Description BLOOD LEFT FOREARM  Final   Special Requests   Final    BOTTLES DRAWN AEROBIC AND ANAEROBIC Blood Culture adequate volume   Culture   Final    NO GROWTH 2  DAYS Performed at [facility], [address] [place], [place] [address]    Report Status PENDING  Incomplete  Culture, sputum-assessment     Status: None   Collection Time: 09/29/19  1:34 AM   Specimen: Sputum  Result Value Ref Range Status  Specimen Description SPU  Final   Special Requests NONE  Final   Sputum evaluation   Final    Sputum specimen not acceptable for testing.  Please recollect.   C/ CHRIS BUCKNER @ 0445 0N 09/29/2019 RH Performed at [facility], [address] [place], [place] [address]    Report Status 09/29/2019 FINAL  Final  MRSA PCR Screening     Status: None   Collection Time: 09/30/19 11:34 AM   Specimen: Nasal Mucosa; Nasopharyngeal  Result Value Ref Range Status   MRSA by PCR NEGATIVE NEGATIVE Final    Comment:        The GeneXpert MRSA Assay (FDA approved for NASAL specimens only), is one component of a comprehensive MRSA colonization surveillance program. It is not intended to diagnose MRSA infection nor to guide or monitor treatment for MRSA infections. Performed at [facility], [address] [place], [place] [address]       Imaging Studies   CT Angio Chest PE W and/or Wo Contrast  Result Date: 09/28/2019 CLINICAL DATA:  Shortness of breath and cough, history of COVID EXAM: CT ANGIOGRAPHY CHEST WITH CONTRAST TECHNIQUE: Multidetector CT imaging of the chest was performed using the standard protocol during bolus administration of intravenous contrast. Multiplanar CT image reconstructions and MIPs were obtained to evaluate the vascular anatomy. CONTRAST:  66mL OMNIPAQUE IOHEXOL 350 MG/ML SOLN COMPARISON:  March 16, 2019 FINDINGS: Cardiovascular: There is a optimal opacification of the pulmonary arteries. There is no central,segmental, or subsegmental filling defects within the pulmonary arteries. The heart is normal in size. No pericardial effusion or thickening. No evidence right heart strain. There is normal  three-vessel brachiocephalic anatomy without proximal stenosis. The thoracic aorta is normal in appearance. Mediastinum/Nodes: Scattered pre-vascular and subcarinal lymph nodes are seen, likely reactive. No hilar axillary adenopathy. Thyroid gland, trachea, and esophagus demonstrate no significant findings. Lungs/Pleura: Multifocal patchy airspace opacities are seen at both lung bases. There is a trace bilateral pleural effusions. Upper Abdomen: No acute abnormalities present in the visualized portions of the upper abdomen. Musculoskeletal: No chest wall abnormality. No acute or significant osseous findings. Review of the MIP images confirms the above findings. IMPRESSION: No central, segmental, or subsegmental pulmonary embolism. Multifocal patchy airspace opacities at both lung bases which may be due to multifocal infectious etiology. Electronically Signed   By: Jonna Clark M.D.   On: 09/28/2019 23:35   DG Chest Port 1 View  Result Date: 09/28/2019 CLINICAL DATA:  34 year old female with shortness of breath. EXAM: PORTABLE CHEST 1 VIEW COMPARISON:  Chest radiograph dated 07/04/2019. FINDINGS: Shallow inspiration with bibasilar atelectasis. Pneumonia is less likely. Clinical correlation is recommended. There is no pleural effusion or pneumothorax. The cardiac silhouette is within limits. No acute osseous pathology. IMPRESSION: Shallow inspiration with bibasilar atelectasis, less likely infiltrate. Electronically Signed   By: Elgie Collard M.D.   On: 09/28/2019 22:44     Medications   Scheduled Meds: . enoxaparin (LOVENOX) injection  40 mg Subcutaneous Q24H   Continuous Infusions: . sodium chloride 75 mL/hr at 09/30/19 1055  . ceFEPime (MAXIPIME) IV 2 g (09/30/19 0547)  . vancomycin 1,000 mg (09/30/19 1142)       LOS: 1 day    Time spent: 30 minutes    Pennie Banter, DO [facility]  09/30/2019, 2:41 PM    If 7PM-7AM, please contact night-coverage. How to contact the [facility]  Attending or Consulting provider 7A - 7P or covering provider during after hours  7P -7A, for this patient?    1. Check the care team in [facility] and look for a) attending/consulting TR[facility] provider listed and b) the [facility] team listed 2. Log into [email or web address] and use [place]'s universal password to access. If you do not have the password, please contact the hospital operator. 3. Locate the [facility] provider you are looking for under [facility] and page to a number that you can be directly reached. 4. If you still have difficulty reaching the provider, please page the [facility] (Director on Call) for the Hospitalists listed on amion for assistance.

## 2019-09-30 NOTE — Consult Note (Signed)
Pulmonary Medicine          Date: 09/30/2019,   MRN# [id number] Jordan [name] [birth year]     AdmissionWeight: 74.8 kg                 CurrentWeight: 75.3 kg  Referring physician: Dr. Arbutus Ped   CHIEF COMPLAINT:   Persistent respiratory distress post COVID-19 pneumonia   HISTORY OF PRESENT ILLNESS   This is a pleasant 34 year old female with a history of COVID-19 pneumonia almost 1 year ago.  She never quite regained baseline respiratory status and has been persistently tachycardic with chronic cough and dyspnea at rest with an mMRC of 3-4.  With minimal exertion patient has Borg 5 symptoms of dyspnea.  She was retested for COVID-19 on this admission which was negative.  On this admission she was found to be febrile 101.4 significantly tachycardic and tachypneic with increased requirement of oxygen as well as CBC significant for leukocytosis mild stage I AKI.  CT chest with PE protocol was done on this admission which shows bibasilar infiltrates/pneumonitis with possible eventration of the right hemidiaphragm as well as bibasilar atelectasis.  There is also reactive hilar lymphadenopathy bilaterally.  Patient denies constitutional symptoms.   PAST MEDICAL HISTORY   Past Medical History:  Diagnosis Date  . Blood transfusion without reported diagnosis   . COVID-19 09/02/2018  . Seizures ([place])      SURGICAL HISTORY   History reviewed. No pertinent surgical history.   FAMILY HISTORY   Family History  Problem Relation Age of Onset  . Healthy Mother   . Migraines Mother   . Healthy Father      SOCIAL HISTORY   Social History   Tobacco Use  . Smoking status: Never Smoker  . Smokeless tobacco: Never Used  Substance Use Topics  . Alcohol use: No  . Drug use: No     MEDICATIONS    Home Medication:    Current Medication:  Current Facility-Administered Medications:  .  0.9 %  sodium chloride infusion, , Intravenous, Continuous, Ezekiel Slocumb, DO, Last Rate: 75 mL/hr at 09/30/19 1055, Rate Change at 09/30/19 1055 .  acetaminophen (TYLENOL) tablet 650 mg, 650 mg, Oral, Q6H PRN, Lang Snow, NP, 650 mg at 09/29/19 2104 .  ceFEPIme (MAXIPIME) 2 g in sodium chloride 0.9 % 100 mL IVPB, 2 g, Intravenous, Q8H, Garba, Mohammad L, MD, Last Rate: 200 mL/hr at 09/30/19 1453, 2 g at 09/30/19 1453 .  enoxaparin (LOVENOX) injection 40 mg, 40 mg, Subcutaneous, Q24H, Garba, Mohammad L, MD, 40 mg at 09/30/19 0113 .  traZODone (DESYREL) tablet 25 mg, 25 mg, Oral, QHS PRN, Vashti Hey, MD, 25 mg at 09/29/19 2105    ALLERGIES   Patient has no known allergies.     REVIEW OF SYSTEMS    Review of Systems:  Gen:  Denies  fever, sweats, chills weigh loss  HEENT: Denies blurred vision, double vision, ear pain, eye pain, hearing loss, nose bleeds, sore throat Cardiac:  No dizziness, chest pain or heaviness, chest tightness,edema Resp:   Denies cough or sputum porduction, shortness of breath,wheezing, hemoptysis,  Gi: Denies swallowing difficulty, stomach pain, nausea or vomiting, diarrhea, constipation, bowel incontinence Gu:  Denies bladder incontinence, burning urine Ext:   Denies Joint pain, stiffness or swelling Skin: Denies  skin rash, easy bruising or bleeding or hives Endoc:  Denies polyuria, polydipsia , polyphagia or weight change Psych:   Denies depression, insomnia or hallucinations  Other:  All other systems negative   VS: BP (!) 127/100 (BP Location: Left Arm)   Pulse 94   Temp 99.2 F (37.3 C) (Oral)   Resp 19   Ht 5\' 7"  (1.702 m)   Wt 75.3 kg   LMP 09/15/2019 Comment: neg preg test  SpO2 97%   BMI 26.01 kg/m      PHYSICAL EXAM    GENERAL:NAD, no fevers, chills, no weakness no fatigue HEAD: Normocephalic, atraumatic.  EYES: Pupils equal, round, reactive to light. Extraocular muscles intact. No scleral icterus.  MOUTH: Moist mucosal membrane. Dentition intact. No abscess noted.  EAR,  NOSE, THROAT: Clear without exudates. No external lesions.  NECK: Supple. No thyromegaly. No nodules. No JVD.  PULMONARY: Rhonchi at the bases bilaterally CARDIOVASCULAR: S1 and S2. Regular rate and rhythm. No murmurs, rubs, or gallops. No edema. Pedal pulses 2+ bilaterally.  GASTROINTESTINAL: Soft, nontender, nondistended. No masses. Positive bowel sounds. No hepatosplenomegaly.  MUSCULOSKELETAL: No swelling, clubbing, or edema. Range of motion full in all extremities.  NEUROLOGIC: Cranial nerves II through XII are intact. No gross focal neurological deficits. Sensation intact. Reflexes intact.  SKIN: No ulceration, lesions, rashes, or cyanosis. Skin warm and dry. Turgor intact.  PSYCHIATRIC: Mood, affect within normal limits. The patient is awake, alert and oriented x 3. Insight, judgment intact.       IMAGING    CT Angio Chest PE W and/or Wo Contrast  Result Date: 09/28/2019 CLINICAL DATA:  Shortness of breath and cough, history of COVID EXAM: CT ANGIOGRAPHY CHEST WITH CONTRAST TECHNIQUE: Multidetector CT imaging of the chest was performed using the standard protocol during bolus administration of intravenous contrast. Multiplanar CT image reconstructions and MIPs were obtained to evaluate the vascular anatomy. CONTRAST:  28mL OMNIPAQUE IOHEXOL 350 MG/ML SOLN COMPARISON:  March 16, 2019 FINDINGS: Cardiovascular: There is a optimal opacification of the pulmonary arteries. There is no central,segmental, or subsegmental filling defects within the pulmonary arteries. The heart is normal in size. No pericardial effusion or thickening. No evidence right heart strain. There is normal three-vessel brachiocephalic anatomy without proximal stenosis. The thoracic aorta is normal in appearance. Mediastinum/Nodes: Scattered pre-vascular and subcarinal lymph nodes are seen, likely reactive. No hilar axillary adenopathy. Thyroid gland, trachea, and esophagus demonstrate no significant findings.  Lungs/Pleura: Multifocal patchy airspace opacities are seen at both lung bases. There is a trace bilateral pleural effusions. Upper Abdomen: No acute abnormalities present in the visualized portions of the upper abdomen. Musculoskeletal: No chest wall abnormality. No acute or significant osseous findings. Review of the MIP images confirms the above findings. IMPRESSION: No central, segmental, or subsegmental pulmonary embolism. Multifocal patchy airspace opacities at both lung bases which may be due to multifocal infectious etiology. Electronically Signed   By: March 18, 2019 M.D.   On: 09/28/2019 23:35   DG Chest Port 1 View  Result Date: 09/28/2019 CLINICAL DATA:  34 year old female with shortness of breath. EXAM: PORTABLE CHEST 1 VIEW COMPARISON:  Chest radiograph dated 07/04/2019. FINDINGS: Shallow inspiration with bibasilar atelectasis. Pneumonia is less likely. Clinical correlation is recommended. There is no pleural effusion or pneumothorax. The cardiac silhouette is within limits. No acute osseous pathology. IMPRESSION: Shallow inspiration with bibasilar atelectasis, less likely infiltrate. Electronically Signed   By: 09/03/2019 M.D.   On: 09/28/2019 22:44      ASSESSMENT/PLAN   Acute on chronic hypoxemic respiratory failure -In the context of febrile illness and abnormal CT chest post COVID-19 pneumonia several months ago -Differential to include  infectious versus inflammatory etiology including fungal/bacterial/viral lower respiratory tract infection, as well as bibasilar fibrosis and atelectasis as evidenced above with eventration of right hemidiaphragm -Respiratory cultures-and process -Respiratory viral panel -Blood cultures collected 09/28/2019-in process - will consider bronchoscopy if invasive specimen is necessary  -Serum Fungitell -Recruitment maneuvers with MetaNeb therapy using saline to recruit atelectatic segments -We will consider antifibrotic therapy on outpatient post  discharge Currently on cefepime-MRSA nasal negative -No pulmonary thromboembolism on most recent CT chest-09/28/2019    Thank you for allowing me to participate in the care of this patient.   Patient/Family are satisfied with care plan and all questions have been answered.   This document was prepared using Dragon voice recognition software and may include unintentional dictation errors.     Vida Rigger, M.D.  Division of Pulmonary & Critical Care Medicine  [facility]

## 2019-09-30 NOTE — Hospital Course (Signed)
Jordan [name] is a 34 y.o. female with medical history significant of seizures, COVID-19 infection about one year ago with frequent respiratory infections, tachycardia and persistent fatigue since then.  Presented to the ED on 09/28/19 with fevers, chills, shortness of breath and cough.  Patient had a repeat Covid test which was negative.  Chest x-ray showed shallow inspiration with bibasilar atelectasis versus less likely infiltrates.  CTA chest was negative for PE but showed multifocal patchy airspace opacities in both lung bases.  Of note, patient had recently completed 10-day course of Levaquin as outpatient without improvement in her symptoms.  In the ED, febrile 101.4 F, hypertensive 149/101, tachycardic heart rate 150, tachypneic respirations 24, oxygen sat 94% on 2 L/min labs notable for leukocytosis 13.7k, normal lactic acid.  Admitted to hospitalist service for further management of healthcare associated pneumonia, post Covid-19 infection.

## 2019-10-01 ENCOUNTER — Inpatient Hospital Stay: Payer: 59

## 2019-10-01 ENCOUNTER — Inpatient Hospital Stay (HOSPITAL_COMMUNITY)
Admit: 2019-10-01 | Discharge: 2019-10-01 | Disposition: A | Discharge status: Home / Self Care | Payer: 59 | Attending: Pulmonary Disease | Admitting: Pulmonary Disease

## 2019-10-01 DIAGNOSIS — R0602 Shortness of breath: Secondary | ICD-10-CM

## 2019-10-01 LAB — CBC
HCT: 37.7 % (ref 36.0–46.0)
Hemoglobin: 12.9 g/dL (ref 12.0–15.0)
MCH: 31.2 pg (ref 26.0–34.0)
MCHC: 34.2 g/dL (ref 30.0–36.0)
MCV: 91.1 fL (ref 80.0–100.0)
Platelets: 326 10*3/uL (ref 150–400)
RBC: 4.14 MIL/uL (ref 3.87–5.11)
RDW: 11.7 % (ref 11.5–15.5)
WBC: 6.4 10*3/uL (ref 4.0–10.5)
nRBC: 0 % (ref 0.0–0.2)

## 2019-10-01 LAB — RESPIRATORY PANEL BY PCR

## 2019-10-01 LAB — BASIC METABOLIC PANEL
Anion gap: 11 (ref 5–15)
BUN: 10 mg/dL (ref 6–20)
CO2: 28 mmol/L (ref 22–32)
Calcium: 9.4 mg/dL (ref 8.9–10.3)
Chloride: 103 mmol/L (ref 98–111)
Creatinine, Ser: 0.95 mg/dL (ref 0.44–1.00)
GFR calc Af Amer: >60 mL/min (ref >60)
GFR calc non Af Amer: >60 mL/min (ref >60)
Glucose, Bld: 108 mg/dL — ABNORMAL HIGH (ref 70–99)
Potassium: 3.7 mmol/L (ref 3.5–5.1)
Sodium: 142 mmol/L (ref 135–145)

## 2019-10-01 LAB — MAGNESIUM: Magnesium: 2.1 mg/dL (ref 1.7–2.4)

## 2019-10-01 LAB — ECHOCARDIOGRAM COMPLETE
Height: 67 in
Weight: 2657.6 oz

## 2019-10-01 MED ORDER — PREDNISONE 20 MG PO TABS: 40.0000 mg | ORAL_TABLET | Freq: Every day | ORAL | Status: DC

## 2019-10-01 MED ORDER — ONDANSETRON HCL 4 MG/2ML IJ SOLN
4.0000 mg | Freq: Four times a day (QID) | INTRAMUSCULAR | Status: DC | PRN
  Administered 2019-10-01 – 2019-10-02 (×2): 4 mg via INTRAVENOUS
  Filled 2019-10-01 (×2): qty 2

## 2019-10-01 NOTE — Progress Notes (Signed)
OT Cancellation Note  Patient Details Name: Jordan [name] MRN: [id number] DOB: 30-Jun-1985   Cancelled Treatment:    Reason Eval/Treat Not Completed: Medical issues which prohibited therapy. Consult received, chart reviewed. Pt pending results from respiratory panel at this time. Will hold OT evaluation at this time and re-attempt pending results and updated plan of care and any pertinent precautions to follow.   Richrd Prime, MPH, MS, OTR/L ascom [phone] 10/01/19, 2:44 PM

## 2019-10-01 NOTE — Progress Notes (Signed)
Patient c/o dizziness and this RN too her V/signs  and her BP  was 119/96. Patient stated the dizziness was gone and it happened for a short period. Bed alarm activated to prevent fall but patient refused the bed alarm. No acute distress noted. Will continence to monitor.

## 2019-10-01 NOTE — Evaluation (Signed)
Physical Therapy Evaluation Patient Details Name: Jordan [name] MRN: [id number] DOB: 15-Dec-1985 Today's Date: 10/01/2019   History of Present Illness  presented to ER secondary to SOB, chills, cough; admitted for management of sepsis, post-covid HCAP PNA.  Clinical Impression  Patient resting in bed upon arrival to session; easily awakens to voice.  Pleasant and cooperative with evaluation; good awareness of deficits and current limitations.  Bilat UE/LE strength and ROM grossly symmetrical and WFL; no focal weakness appreciated.  Able to complete bed mobility with indep; sit/stand, basic transfers and gait (20' x3) without assist device, mod indep.  Demonstrates reciprocal stepping pattern with generally guarded trunk rotation and arm position; however, no buckling, LOB noted.  ABle to complete turns, obstacle negotiation without difficulty.  HR elevation to 150 with minimal gait distance, with accompanying reports of "weakness and heaviness"; resolves to baseline 110s within 10-15 seconds of seated rest.  RN aware. Would benefit from skilled PT to address above deficits and promote optimal return to PLOF with appropriate functional endurance/activity tolerance; will maintain on caseload throughout remaining stay to ensure progressive mobility efforts.  Anticipate no formal PT needs upon discharge, but would benefit from outpatient pulmonary rehab program as medically appropriate.    Follow Up Recommendations No PT follow up(outpatient pulmonary rehab at discharge)    Equipment Recommendations       Recommendations for Other Services       Precautions / Restrictions Precautions Precautions: None Restrictions Weight Bearing Restrictions: No      Mobility  Bed Mobility Overal bed mobility: Independent                Transfers Overall transfer level: Independent                  Ambulation/Gait Ambulation/Gait assistance: Modified independent (Device/Increase  time) Gait Distance (Feet): 20 Feet(x3)         General Gait Details: reciprocal stepping pattern with generally guarded trunk rotation and arm position; however, no buckling, LOB noted.  ABle to complete turns, obstacle negotiation without difficulty.  HR elevation to 150 with minimal gait distance, with accompanying reports of "weakness and heaviness"; resolves to baseline 110s within 10-15 seconds of seated rest.  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     Hand Dominance        Extremity/Trunk Assessment   Upper Extremity Assessment Upper Extremity Assessment: Overall WFL for tasks assessed    Lower Extremity Assessment Lower Extremity Assessment: Overall WFL for tasks assessed       Communication   Communication: No difficulties  Cognition Arousal/Alertness: Awake/alert Behavior During Therapy: WFL for tasks  assessed/performed Overall Cognitive Status: Within Functional Limits for tasks assessed                                        General Comments      Exercises Other Exercises Other Exercises: Orthostatic assessment: sitting BP 129/93, HR 108; standing BP 119/97, HR 130; standing x3 min BP 129/99, HR 149   Assessment/Plan    PT Assessment Patient needs continued PT services  PT  Problem List Decreased activity tolerance;Cardiopulmonary status limiting activity       PT Treatment Interventions DME instruction;Gait training;Stair training;Functional mobility training;Therapeutic activities;Therapeutic exercise;Balance training;Patient/family education    PT Goals (Current goals can be found in the Care Plan section)  Acute Rehab PT Goals Patient Stated Goal: to feel better PT Goal Formulation: With patient/family Time For Goal Achievement: 10/15/19 Potential to Achieve Goals: Good    Frequency Min 2X/week   Barriers to discharge        Co-evaluation               AM-PAC PT "6 Clicks" Mobility  Outcome Measure Help needed turning from your back to your side while in a flat bed without using bedrails?: None Help needed moving from lying on your back to sitting on the side of a flat bed without using bedrails?: None Help needed moving to and from a bed to a chair (including a wheelchair)?: None Help needed standing up from a chair using your arms (e.g., wheelchair or bedside chair)?: None Help needed to walk in hospital room?: None Help needed climbing 3-5 steps with a railing? : A Little 6 Click Score: 23    End of Session Equipment Utilized During Treatment: Gait belt Activity Tolerance: Treatment limited secondary to medical complications (Comment)(HR elevation with minimal exertion) Patient left: in bed;with bed alarm set;with family/visitor present Nurse Communication: Mobility status PT Visit Diagnosis: Muscle weakness (generalized) (M62.81);Difficulty in walking, not elsewhere classified (R26.2)    Time: 1191-4782 PT Time Calculation (min) (ACUTE ONLY): 16 min   Charges:   PT Evaluation $PT Eval Moderate Complexity: 1 Mod          Bobbye Reinitz H. Manson Passey, PT, DPT, NCS 10/01/19, 8:55 PM [phone]

## 2019-10-01 NOTE — Progress Notes (Signed)
Pulmonary Medicine          Date: 10/01/2019,   MRN# [id number] SEANNE CHIRICO [birth year]     AdmissionWeight: 74.8 kg                 CurrentWeight: 75.3 kg  Referring physician: Dr. Denton Lank   CHIEF COMPLAINT:   Persistent respiratory distress post COVID-19 pneumonia   SUBJECTIVE   Patient is improved, no fevers overnight. CXR today looks slightly better with improved ventilation of right lowe lobe. RVP is negative.     PAST MEDICAL HISTORY   Past Medical History:  Diagnosis Date  . Blood transfusion without reported diagnosis   . COVID-19 09/02/2018  . Seizures (HCC)      SURGICAL HISTORY   History reviewed. No pertinent surgical history.   FAMILY HISTORY   Family History  Problem Relation Age of Onset  . Healthy Mother   . Migraines Mother   . Healthy Father      SOCIAL HISTORY   Social History   Tobacco Use  . Smoking status: Never Smoker  . Smokeless tobacco: Never Used  Substance Use Topics  . Alcohol use: No  . Drug use: No     MEDICATIONS    Home Medication:    Current Medication:  Current Facility-Administered Medications:  .  acetaminophen (TYLENOL) tablet 650 mg, 650 mg, Oral, Q6H PRN, Jimmye Norman, NP, 650 mg at 09/29/19 2104 .  ceFEPIme (MAXIPIME) 2 g in sodium chloride 0.9 % 100 mL IVPB, 2 g, Intravenous, Q8H, Garba, Mohammad L, MD, Last Rate: 200 mL/hr at 10/01/19 1441, 2 g at 10/01/19 1441 .  enoxaparin (LOVENOX) injection 40 mg, 40 mg, Subcutaneous, Q24H, Garba, Mohammad L, MD, 40 mg at 10/01/19 0050 .  furosemide (LASIX) injection 40 mg, 40 mg, Intravenous, Daily, Vida Rigger, MD, 40 mg at 10/01/19 0905 .  ondansetron (ZOFRAN) injection 4 mg, 4 mg, Intravenous, Q6H PRN, Esaw Grandchild A, DO, 4 mg at 10/01/19 1518 .  predniSONE (DELTASONE) tablet 40 mg, 40 mg, Oral, Q breakfast, Esaw Grandchild A, DO .  traZODone (DESYREL) tablet 25 mg, 25 mg, Oral, QHS PRN, Pieter Partridge, MD, 25  mg at 09/29/19 2105    ALLERGIES   Patient has no known allergies.     REVIEW OF SYSTEMS    Review of Systems:  Gen:  Denies  fever, sweats, chills weigh loss  HEENT: Denies blurred vision, double vision, ear pain, eye pain, hearing loss, nose bleeds, sore throat Cardiac:  No dizziness, chest pain or heaviness, chest tightness,edema Resp:   Denies cough or sputum porduction, shortness of breath,wheezing, hemoptysis,  Gi: Denies swallowing difficulty, stomach pain, nausea or vomiting, diarrhea, constipation, bowel incontinence Gu:  Denies bladder incontinence, burning urine Ext:   Denies Joint pain, stiffness or swelling Skin: Denies  skin rash, easy bruising or bleeding or hives Endoc:  Denies polyuria, polydipsia , polyphagia or weight change Psych:   Denies depression, insomnia or hallucinations   Other:  All other systems negative   VS: BP 117/88 (BP Location: Left Arm)   Pulse 97   Temp 99.3 F (37.4 C) (Oral)   Resp 19   Ht 5\' 7"  (1.702 m)   Wt 75.3 kg   LMP 09/15/2019 Comment: neg preg test  SpO2 95%   BMI 26.01 kg/m      PHYSICAL EXAM    GENERAL:NAD, no fevers, chills, no weakness no fatigue HEAD: Normocephalic, atraumatic.  EYES: Pupils equal, round,  reactive to light. Extraocular muscles intact. No scleral icterus.  MOUTH: Moist mucosal membrane. Dentition intact. No abscess noted.  EAR, NOSE, THROAT: Clear without exudates. No external lesions.  NECK: Supple. No thyromegaly. No nodules. No JVD.  PULMONARY: Rhonchi at the bases bilaterally CARDIOVASCULAR: S1 and S2. Regular rate and rhythm. No murmurs, rubs, or gallops. No edema. Pedal pulses 2+ bilaterally.  GASTROINTESTINAL: Soft, nontender, nondistended. No masses. Positive bowel sounds. No hepatosplenomegaly.  MUSCULOSKELETAL: No swelling, clubbing, or edema. Range of motion full in all extremities.  NEUROLOGIC: Cranial nerves II through XII are intact. No gross focal neurological deficits.  Sensation intact. Reflexes intact.  SKIN: No ulceration, lesions, rashes, or cyanosis. Skin warm and dry. Turgor intact.  PSYCHIATRIC: Mood, affect within normal limits. The patient is awake, alert and oriented x 3. Insight, judgment intact.       IMAGING    CT Angio Chest PE W and/or Wo Contrast  Result Date: 09/28/2019 CLINICAL DATA:  Shortness of breath and cough, history of COVID EXAM: CT ANGIOGRAPHY CHEST WITH CONTRAST TECHNIQUE: Multidetector CT imaging of the chest was performed using the standard protocol during bolus administration of intravenous contrast. Multiplanar CT image reconstructions and MIPs were obtained to evaluate the vascular anatomy. CONTRAST:  50mL OMNIPAQUE IOHEXOL 350 MG/ML SOLN COMPARISON:  March 16, 2019 FINDINGS: Cardiovascular: There is a optimal opacification of the pulmonary arteries. There is no central,segmental, or subsegmental filling defects within the pulmonary arteries. The heart is normal in size. No pericardial effusion or thickening. No evidence right heart strain. There is normal three-vessel brachiocephalic anatomy without proximal stenosis. The thoracic aorta is normal in appearance. Mediastinum/Nodes: Scattered pre-vascular and subcarinal lymph nodes are seen, likely reactive. No hilar axillary adenopathy. Thyroid gland, trachea, and esophagus demonstrate no significant findings. Lungs/Pleura: Multifocal patchy airspace opacities are seen at both lung bases. There is a trace bilateral pleural effusions. Upper Abdomen: No acute abnormalities present in the visualized portions of the upper abdomen. Musculoskeletal: No chest wall abnormality. No acute or significant osseous findings. Review of the MIP images confirms the above findings. IMPRESSION: No central, segmental, or subsegmental pulmonary embolism. Multifocal patchy airspace opacities at both lung bases which may be due to multifocal infectious etiology. Electronically Signed   By: Jonna Clark M.D.    On: 09/28/2019 23:35   DG Chest Port 1 View  Result Date: 09/28/2019 CLINICAL DATA:  34 year old female with shortness of breath. EXAM: PORTABLE CHEST 1 VIEW COMPARISON:  Chest radiograph dated 07/04/2019. FINDINGS: Shallow inspiration with bibasilar atelectasis. Pneumonia is less likely. Clinical correlation is recommended. There is no pleural effusion or pneumothorax. The cardiac silhouette is within limits. No acute osseous pathology. IMPRESSION: Shallow inspiration with bibasilar atelectasis, less likely infiltrate. Electronically Signed   By: Elgie Collard M.D.   On: 09/28/2019 22:44   ECHOCARDIOGRAM COMPLETE  Result Date: 10/01/2019    ECHOCARDIOGRAM REPORT   Patient Name:   KENLYN LOSE Date of Exam: 10/01/2019 Medical Rec #:  [id number]            Height:       67.0 in Accession #:    [id number]           Weight:       166.1 lb Date of Birth:  [birth year]            BSA:          1.869 m Patient Age:    33 years  BP:           116/98 mmHg Patient Gender: F                    HR:           113 bpm. Exam Location:  [facility] Procedure: 2D Echo, Color Doppler and Cardiac Doppler Indications:     R01.2 Abnormal Heart Sounds Nec  History:         Patient has no prior history of Echocardiogram examinations.                  Signs/Symptoms:Anxiety. Pt tested positive for COVID-19 on                  09/02/2018.  Sonographer:     Humphrey Rolls RDCS (AE) Referring Phys:  [id number] Vida Rigger Diagnosing Phys: Lorine Bears MD  Sonographer Comments: No subcostal window. IMPRESSIONS  1. Left ventricular ejection fraction, by estimation, is 60 to 65%. The left ventricle has normal function. The left ventricle has no regional wall motion abnormalities. Left ventricular diastolic parameters were normal.  2. Right ventricular systolic function is normal. The right ventricular size is normal. Tricuspid regurgitation signal is inadequate for assessing PA pressure.  3. The mitral valve is normal in  structure. No evidence of mitral valve regurgitation. No evidence of mitral stenosis.  4. The aortic valve is normal in structure. Aortic valve regurgitation is not visualized. No aortic stenosis is present.  5. The inferior vena cava is normal in size with greater than 50% respiratory variability, suggesting right atrial pressure of 3 mmHg. FINDINGS  Left Ventricle: Left ventricular ejection fraction, by estimation, is 60 to 65%. The left ventricle has normal function. The left ventricle has no regional wall motion abnormalities. The left ventricular internal cavity size was normal in size. There is  no left ventricular hypertrophy. Left ventricular diastolic parameters were normal. Right Ventricle: The right ventricular size is normal. No increase in right ventricular wall thickness. Right ventricular systolic function is normal. Tricuspid regurgitation signal is inadequate for assessing PA pressure. Left Atrium: Left atrial size was normal in size. Right Atrium: Right atrial size was normal in size. Pericardium: There is no evidence of pericardial effusion. Mitral Valve: The mitral valve is normal in structure. Normal mobility of the mitral valve leaflets. No evidence of mitral valve regurgitation. No evidence of mitral valve stenosis. MV peak gradient, 1.9 mmHg. The mean mitral valve gradient is 1.0 mmHg. Tricuspid Valve: The tricuspid valve is normal in structure. Tricuspid valve regurgitation is not demonstrated. No evidence of tricuspid stenosis. Aortic Valve: The aortic valve is normal in structure. Aortic valve regurgitation is not visualized. No aortic stenosis is present. Aortic valve mean gradient measures 1.0 mmHg. Aortic valve peak gradient measures 2.5 mmHg. Aortic valve area, by VTI measures 1.63 cm. Pulmonic Valve: The pulmonic valve was normal in structure. Pulmonic valve regurgitation is not visualized. No evidence of pulmonic stenosis. Aorta: The aortic root is normal in size and structure.  Venous: The inferior vena cava is normal in size with greater than 50% respiratory variability, suggesting right atrial pressure of 3 mmHg. IAS/Shunts: No atrial level shunt detected by color flow Doppler.  LEFT VENTRICLE PLAX 2D LVIDd:         3.36 cm  Diastology LVIDs:         2.11 cm  LV e' lateral:   8.70 cm/s LV PW:         0.98  cm  LV E/e' lateral: 5.4 LV IVS:        0.66 cm  LV e' medial:    7.83 cm/s LVOT diam:     1.90 cm  LV E/e' medial:  6.0 LV SV:         24 LV SV Index:   13 LVOT Area:     2.84 cm  RIGHT VENTRICLE RV Basal diam:  2.61 cm LEFT ATRIUM           Index      RIGHT ATRIUM          Index LA diam:      2.00 cm 1.07 cm/m RA Area:     4.27 cm LA Vol (A4C): 11.0 ml 5.89 ml/m RA Volume:   5.39 ml  2.88 ml/m  AORTIC VALVE                   PULMONIC VALVE AV Area (Vmax):    2.12 cm    PV Vmax:       0.75 m/s AV Area (Vmean):   1.82 cm    PV Vmean:      46.500 cm/s AV Area (VTI):     1.63 cm    PV VTI:        0.096 m AV Vmax:           79.20 cm/s  PV Peak grad:  2.3 mmHg AV Vmean:          56.000 cm/s PV Mean grad:  1.0 mmHg AV VTI:            0.144 m AV Peak Grad:      2.5 mmHg AV Mean Grad:      1.0 mmHg LVOT Vmax:         59.10 cm/s LVOT Vmean:        35.900 cm/s LVOT VTI:          0.083 m LVOT/AV VTI ratio: 0.58  AORTA Ao Root diam: 3.10 cm MITRAL VALVE MV Area (PHT): 7.99 cm    SHUNTS MV Peak grad:  1.9 mmHg    Systemic VTI:  0.08 m MV Mean grad:  1.0 mmHg    Systemic Diam: 1.90 cm MV Vmax:       0.70 m/s MV Vmean:      51.2 cm/s MV Decel Time: 95 msec MV E velocity: 47.10 cm/s MV A velocity: 53.40 cm/s MV E/A ratio:  0.88 Kathlyn Sacramento MD Electronically signed by Kathlyn Sacramento MD Signature Date/Time: 10/01/2019/11:12:48 AM    Final           ASSESSMENT/PLAN   Acute on chronic hypoxemic respiratory failure -In the context of febrile illness and abnormal CT chest post COVID-19 pneumonia several months ago -Differential to include infectious versus inflammatory etiology  including fungal/bacterial/viral lower respiratory tract infection, as well as bibasilar fibrosis and atelectasis as evidenced above with eventration of right hemidiaphragm -Respiratory cultures-and process -Respiratory viral panel -Blood cultures collected 09/28/2019-in process - will consider bronchoscopy if invasive specimen is necessary  -Serum Fungitell -Recruitment maneuvers with MetaNeb therapy using saline to recruit atelectatic segments -We will consider antifibrotic therapy on outpatient post discharge Currently on cefepime-MRSA nasal negative -No pulmonary thromboembolism on most recent CT chest-09/28/2019    Thank you for allowing me to participate in the care of this patient.   Patient/Family are satisfied with care plan and all questions have been answered.   This document was prepared using Dragon voice  recognition software and may include unintentional dictation errors.     Ottie Glazier, M.D.  [facility] [place]

## 2019-10-01 NOTE — Progress Notes (Signed)
PT Cancellation Note  Patient Details Name: Jordan [name] MRN: [id number] DOB: Aug 14, 1985   Cancelled Treatment:    Reason Eval/Treat Not Completed: Other (comment);Medical issues which prohibited therapy(viral labs pending ) PT consult received and chart reviewed. Patient currently has viral test pending. Nursing reports patient is independent with mobility but does have shortness of breath with activity. PT will follow up when test available as appropriate.   Donna Bernard, PT, MPT  Ina Homes 10/01/2019, 1:45 PM

## 2019-10-01 NOTE — Progress Notes (Signed)
This RN requested for RUPPCR container from the lab at the beginning of this shift. Patient reluctant to allow this RN to  Swab her nose due to pain/discomfort. I was able to convince the patient to get the sample. Now the  lab is saying the container they sent earlier was  a wrong one and has sent a different one to redo the PCR swab. Patient is sleeping at this time and will let her know to obtain another sample.

## 2019-10-01 NOTE — Progress Notes (Addendum)
PROGRESS NOTE    Jordan [name]   KXF:[id number]  DOB: 1985/12/21  PCP: Kerri Perches, PA-C    DOA: 09/28/2019 LOS: 2   Brief Narrative   Jordan [name] is a 34 y.o. female with medical history significant of seizures, COVID-19 infection about one year ago with frequent respiratory infections, tachycardia and persistent fatigue since then.  Presented to the ED on 09/28/19 with fevers, chills, shortness of breath and cough.  Patient had a repeat Covid test which was negative.  Chest x-ray showed shallow inspiration with bibasilar atelectasis versus less likely infiltrates.  CTA chest was negative for PE but showed multifocal patchy airspace opacities in both lung bases.  Of note, patient had recently completed 10-day course of Levaquin as outpatient without improvement in her symptoms.  In the ED, febrile 101.4 F, hypertensive 149/101, tachycardic heart rate 150, tachypneic respirations 24, oxygen sat 94% on 2 L/min labs notable for leukocytosis 13.7k, normal lactic acid.  Admitted to hospitalist service for further management of healthcare associated pneumonia, post Covid-19 infection.       Assessment & Plan   Principal Problem:   Sepsis ([place]) Active Problems:   Healthcare-associated pneumonia   Sinus tachycardia   Hypokalemia   Sepsis secondary to multifocal pneumonia - Sepsis present on admission with fever, tachycardia, tachypnea, leukocytosis in setting of pneumonia.  No end organ dysfunction, normal lactic acid.  Treated per sepsis protocol with IV fluids and antibiotics in the ED.  Failed 10 day outpatient Levaquin course.  Treating as HCAP, but unclear if this is infectious (viral/bacterial/fungal) vs inflammatory post-Covid changes. --MRSA screen negative, d/c Vancomycin --continue Cefepime  --pulmonology consulted, appreciate their assistance --f/u respiratory viral panel, serum Fungitell, and cultures --incentive spirometry, encourage frequent use --will contact  [facility] for possible transfer per patient and family request --trial of Prednisone, as pt notes improvement while on steroids as outpatient, although not with the Medrol dose pack she was taking for days leading up to admission.  Will try higher dose.  Hypokalemia -present on admission, replaced.  Monitor daily, replace as needed.  Sinus tachycardia -present on admission, secondary to sepsis.  Monitor.  History of Seizures - none recent, and no acute issues.   Bacterial Vaginosis -recently diagnosed outpatient and prescribed intravaginal Flagyl cream which she had not started.  Likely this will be covered by antibiotics above.  Diflucan per patient request, reports yeast infections with use of antibiotics.   DVT prophylaxis: Lovenox  Diet:  Diet Orders (From admission, onward)    Start     Ordered   09/30/19 1842  Diet renal with fluid restriction Fluid restriction: 1200 mL Fluid; Room service appropriate? Yes; Fluid consistency: Thin  Diet effective now    Question Answer Comment  Fluid restriction: 1200 mL Fluid   Room service appropriate? Yes   Fluid consistency: Thin      09/30/19 1841            Code Status: Full Code    Subjective 10/01/19    Patient seen with husband and mother at bedside this morning.  No acute events reported overnight.  She reports feeling the same.  She was up standing at sink to brush her teeth and got extremely short of breath.  Says metaneb made her almost faint.  No fever/chills.  Patient and family request a second opinion, specifically at [facility].   Disposition Plan & Communication   Status is: Inpatient  Remains inpatient appropriate because:IV treatments appropriate due to intensity of  illness or inability to take PO   Dispo: The patient is from: Home              Anticipated d/c is to: Home              Anticipated d/c date is: 2 days              Patient currently is not medically stable to d/c.         Family Communication: Husband  and mother at bedside during encounter, updated all questions answered   Consults, Procedures, Significant Events   Consultants:   Pulmonology  Procedures:   None  Antimicrobials:   Vancomycin 6/1 >> 6/2  Cefepime 6/1 >>   Objective   Vitals:   09/30/19 2036 09/30/19 2253 10/01/19 0559 10/01/19 0739  BP: (!) 130/97 (!) 119/96 108/83 (!) 116/98  Pulse: 91 95 (!) 101 99  Resp: 20  20 20   Temp: 99.4 F (37.4 C) 98.7 F (37.1 C) 98.7 F (37.1 C) 98.1 F (36.7 C)  TempSrc: Oral Oral Oral Oral  SpO2: 98% 97% 97% 93%  Weight:      Height:        Intake/Output Summary (Last 24 hours) at 10/01/2019 0751 Last data filed at 09/30/2019 2329 Gross per 24 hour  Intake 1600 ml  Output 3550 ml  Net -1950 ml   Filed Weights   09/29/19 0759 09/29/19 1116 09/30/19 0544  Weight: 74.8 kg 76.1 kg 75.3 kg    Physical Exam:  General exam: awake, alert, no acute distress Respiratory system: Significantly decreased breath sounds, no wheezes, rales or rhonchi, normal respiratory effort. Cardiovascular system: normal S1/S2, RRR, no pedal edema.   Central nervous system: A&O x4. no gross focal neurologic deficits, normal speech Extremities: moves all, normal tone Skin: dry, intact, normal temperature Psychiatry: normal mood, congruent affect, judgement and insight appear normal  Labs   Data Reviewed: I have personally reviewed following labs and imaging studies  CBC: Recent Labs  Lab 09/28/19 2222 09/29/19 0444 09/30/19 0754 10/01/19 0537  WBC 13.7* 14.3* 6.6 6.4  NEUTROABS 11.5*  --   --   --   HGB 13.7 11.3* 12.1 12.9  HCT 40.7 33.0* 36.3 37.7  MCV 92.1 92.2 92.8 91.1  PLT 329 276 304 326   Basic Metabolic Panel: Recent Labs  Lab 09/28/19 2222 09/29/19 0444 09/30/19 0754 10/01/19 0537  NA 138 140 141 142  K 3.3* 3.5 3.8 3.7  CL 101 109 104 103  CO2 26 24 28 28   GLUCOSE 118* 120* 97 108*  BUN 11 10 8 10   CREATININE 1.04* 0.95 0.98 0.95  CALCIUM 9.5 8.1* 8.9  9.4  MG  --   --   --  2.1   GFR: Estimated Creatinine Clearance: 90.9 mL/min (by C-G formula based on SCr of 0.95 mg/dL). Liver Function Tests: Recent Labs  Lab 09/28/19 2222 09/29/19 0444  AST 17 15  ALT 13 9  ALKPHOS 52 41  BILITOT 1.4* 1.5*  PROT 8.2* 6.2*  ALBUMIN 4.3 3.1*   No results for input(s): LIPASE, AMYLASE in the last 168 hours. No results for input(s): AMMONIA in the last 168 hours. Coagulation Profile: Recent Labs  Lab 09/28/19 2222  INR 1.1   Cardiac Enzymes: No results for input(s): CKTOTAL, CKMB, CKMBINDEX, TROPONINI in the last 168 hours. BNP (last 3 results) No results for input(s): PROBNP in the last 8760 hours. HbA1C: No results for input(s): HGBA1C in the last  72 hours. CBG: No results for input(s): GLUCAP in the last 168 hours. Lipid Profile: Recent Labs    09/28/19 2222  TRIG 42   Thyroid Function Tests: No results for input(s): TSH, T4TOTAL, FREET4, T3FREE, THYROIDAB in the last 72 hours. Anemia Panel: Recent Labs    09/28/19 2222  FERRITIN 31   Sepsis Labs: Recent Labs  Lab 09/28/19 2222 09/29/19 0022  PROCALCITON 0.84  --   LATICACIDVEN 1.8 1.5    Recent Results (from the past 240 hour(s))  Blood Culture (routine x 2)     Status: None (Preliminary result)   Collection Time: 09/28/19 10:22 PM   Specimen: BLOOD RIGHT FOREARM  Result Value Ref Range Status   Specimen Description BLOOD RIGHT FOREARM  Final   Special Requests   Final    BOTTLES DRAWN AEROBIC AND ANAEROBIC Blood Culture adequate volume   Culture   Final    NO GROWTH 2 DAYS Performed at [facility], [address], [place], [place] [address]    Report Status PENDING  Incomplete  Urine culture     Status: None   Collection Time: 09/28/19 10:22 PM   Specimen: In/Out Cath Urine  Result Value Ref Range Status   Specimen Description   Final    IN/OUT CATH URINE Performed at [facility], [address], [place], [place] [address]     Special Requests   Final    NONE Performed at [facility], [address] [place], [place] [address]    Culture   Final    NO GROWTH Performed at [facility], [address] [place], [place] [address]    Report Status 09/30/2019 FINAL  Final  SARS Coronavirus 2 by RT PCR (hospital order, performed in [facility] hospital lab) Nasopharyngeal     Status: None   Collection Time: 09/28/19 10:22 PM   Specimen: Nasopharyngeal  Result Value Ref Range Status   SARS Coronavirus 2 NEGATIVE NEGATIVE Final    Comment: (NOTE) SARS-CoV-2 target nucleic acids are NOT DETECTED. The SARS-CoV-2 RNA is generally detectable in upper and lower respiratory specimens during the acute phase of infection. The lowest concentration of SARS-CoV-2 viral copies this assay can detect is 250 copies / mL. A negative result does not preclude SARS-CoV-2 infection and should not be used as the sole basis for treatment or other patient management decisions.  A negative result may occur with improper specimen collection / handling, submission of specimen other than nasopharyngeal swab, presence of viral mutation(s) within the areas targeted by this assay, and inadequate number of viral copies (<250 copies / mL). A negative result must be combined with clinical observations, patient history, and epidemiological information. Fact Sheet for Patients:   BoilerBrush.com.cy Fact Sheet for Healthcare Providers: https://pope.com/ This test is not yet approved or cleared  by the [place] FDA and has been authorized for detection and/or diagnosis of SARS-CoV-2 by FDA under an Emergency Use Authorization (EUA).  This EUA will remain in effect (meaning this test can be used) for the duration of the COVID-19 declaration under Section 564(b)(1) of the Act, 21 U.S.C. section 360bbb-3(b)(1), unless the authorization is terminated or revoked sooner. Performed at  [facility], [address] [place], [place] [address]   Blood Culture (routine x 2)     Status: None (Preliminary result)   Collection Time: 09/28/19 10:27 PM   Specimen: BLOOD LEFT FOREARM  Result Value Ref Range Status   Specimen Description BLOOD  LEFT FOREARM  Final   Special Requests   Final    BOTTLES DRAWN AEROBIC AND ANAEROBIC Blood Culture adequate volume   Culture   Final    NO GROWTH 2 DAYS Performed at [facility], [address], [place], [place] [address]    Report Status PENDING  Incomplete  Culture, sputum-assessment     Status: None   Collection Time: 09/29/19  1:34 AM   Specimen: Sputum  Result Value Ref Range Status   Specimen Description SPU  Final   Special Requests NONE  Final   Sputum evaluation   Final    Sputum specimen not acceptable for testing.  Please recollect.   C/ CHRIS BUCKNER @ 0445 0N 09/29/2019 RH Performed at [facility], [address] [place], [place] [address]    Report Status 09/29/2019 FINAL  Final  MRSA PCR Screening     Status: None   Collection Time: 09/30/19 11:34 AM   Specimen: Nasal Mucosa; Nasopharyngeal  Result Value Ref Range Status   MRSA by PCR NEGATIVE NEGATIVE Final    Comment:        The GeneXpert MRSA Assay (FDA approved for NASAL specimens only), is one component of a comprehensive MRSA colonization surveillance program. It is not intended to diagnose MRSA infection nor to guide or monitor treatment for MRSA infections. Performed at [facility], [address] [place], [place] [address]       Imaging Studies   No results found.   Medications   Scheduled Meds: . enoxaparin (LOVENOX) injection  40 mg Subcutaneous Q24H  . furosemide  40 mg Intravenous Daily   Continuous Infusions: . sodium chloride 75 mL/hr at 09/30/19 1055  . ceFEPime (MAXIPIME) IV 2 g (10/01/19 0630)       LOS: 2 days    Time spent: 45 minutes total, with > 50% spent in  coordination of care and direct patient contact.    Pennie Banter, DO [facility]  10/01/2019, 7:51 AM    If 7PM-7AM, please contact night-coverage. How to contact the [facility] Attending or Consulting provider 7A - 7P or covering provider during after hours 7P -7A, for this patient?    1. Check the care team in [facility] and look for a) attending/consulting [facility] provider listed and b) the [facility] team listed 2. Log into [email or web address] and use [place]'s universal password to access. If you do not have the password, please contact the hospital operator. 3. Locate the [facility] provider you are looking for under [facility] and page to a number that you can be directly reached. 4. If you still have difficulty reaching the provider, please page the [facility] (Director on Call) for the Hospitalists listed on amion for assistance.

## 2019-10-01 NOTE — Progress Notes (Signed)
*  PRELIMINARY RESULTS* Echocardiogram 2D Echocardiogram has been performed.  Jordan [name] [name] 10/01/2019, 9:39 AM

## 2019-10-02 LAB — URINE DRUG SCREEN, QUALITATIVE (ARMC ONLY)
Amphetamines, Ur Screen: NOT DETECTED
Barbiturates, Ur Screen: NOT DETECTED
Benzodiazepine, Ur Scrn: NOT DETECTED
Cannabinoid 50 Ng, Ur ~~LOC~~: NOT DETECTED
Cocaine Metabolite,Ur ~~LOC~~: NOT DETECTED
MDMA (Ecstasy)Ur Screen: NOT DETECTED
Methadone Scn, Ur: NOT DETECTED
Opiate, Ur Screen: NOT DETECTED
Phencyclidine (PCP) Ur S: NOT DETECTED
Tricyclic, Ur Screen: NOT DETECTED

## 2019-10-02 MED ORDER — METRONIDAZOLE 0.75 % VA GEL: 1.0000 | Freq: Every day | VAGINAL | 0 refills | Status: AC

## 2019-10-02 MED ORDER — DOXYCYCLINE HYCLATE 100 MG PO TABS: 100.0000 mg | ORAL_TABLET | Freq: Two times a day (BID) | ORAL | 0 refills | Status: AC

## 2019-10-02 MED ORDER — CHLORHEXIDINE GLUCONATE CLOTH 2 % EX PADS: 6.0000 | MEDICATED_PAD | Freq: Every day | CUTANEOUS | Status: DC

## 2019-10-02 MED ORDER — FLUCONAZOLE 150 MG PO TABS
150.0000 mg | ORAL_TABLET | Freq: Every day | ORAL | 0 refills | Status: AC
Start: 2019-10-02 — End: 2019-10-03

## 2019-10-02 MED ORDER — DOXYCYCLINE HYCLATE 100 MG PO TABS
100.0000 mg | ORAL_TABLET | Freq: Two times a day (BID) | ORAL | Status: DC
  Administered 2019-10-02: 100 mg via ORAL
  Filled 2019-10-02: qty 1

## 2019-10-02 MED ORDER — SODIUM CHLORIDE 0.9% FLUSH: 10.0000 mL | INTRAVENOUS | Status: DC | PRN

## 2019-10-02 NOTE — Discharge Instructions (Signed)
Community-Acquired Pneumonia, Adult Pneumonia is a type of lung infection that causes swelling in the airways of the lungs. Mucus and fluid may also build up inside the airways. This may cause coughing and difficulty breathing. There are different types of pneumonia. One type can develop while a person is in a hospital. A different type is called community-acquired pneumonia. It develops in people who are not, and have not recently been, in the hospital or another type of health care facility. What are the causes? This condition may be caused by:  Viruses. This is the most common cause of pneumonia.  Bacteria. Community-acquired pneumonia is often caused by Streptococcus pneumoniae bacteria. These bacteria are often passed from one person to another by breathing in droplets from the cough or sneeze of an infected person.  Fungi. This is the least common cause of pneumonia. What increases the risk? The following factors may make you more likely to develop this condition:  Having a chronic disease, such as chronic obstructive pulmonary disease (COPD), asthma, congestive heart failure, cystic fibrosis, diabetes, or kidney disease.  Having early-stage or late-stage HIV.  Having sickle cell disease.  Having had your spleen removed (splenectomy).  Having poor dental hygiene.  Having a medical condition that increases the risk of breathing in (aspirating) secretions from your own mouth and nose.  Having a weakened body defense system (immune system).  Being a smoker.  Traveling to areas where pneumonia-causing germs commonly exist.  Being around animal habitats or animals that have pneumonia-causing germs, including birds, bats, rabbits, cats, and farm animals. What are the signs or symptoms? Symptoms of this condition include:  A dry cough.  A wet (productive) cough.  Fever.  Sweating.  Chest pain, especially when breathing deeply or coughing.  Rapid breathing or difficulty  breathing.  Shortness of breath.  Shaking chills.  Fatigue.  Muscle aches. How is this diagnosed? This condition may be diagnosed based on:  Your medical history.  A physical exam. You may also have tests, including:  Chest X-rays.  Tests of your blood oxygen level and other blood gases.  Tests on blood, mucus (sputum), fluid around your lungs (pleural fluid), and urine. If your pneumonia is severe, other tests may be done to find the exact cause of your illness. How is this treated? Treatment for this condition depends on many factors, such as the cause of your pneumonia, the medicines you take, and other medical conditions that you have. For most adults, treatment and recovery from pneumonia may occur at home. In some cases, treatment must happen in a hospital. Treatment may include:  Medicines that are given by mouth or through an IV, including: ? Antibiotic medicines, if the pneumonia was caused by bacteria. ? Antiviral medicines, if the pneumonia was caused by a virus.  Being given extra oxygen.  Respiratory therapy. Although rare, treating severe pneumonia may include:  Using a machine to help you breathe (mechanical ventilation). This is done if you are not breathing well on your own and you cannot maintain a safe blood oxygen level.  Thoracentesis. This is a procedure to remove fluid from around one lung or both lungs to help you breathe better. Follow these instructions at home:  Medicines  Take over-the-counter and prescription medicines only as told by your health care provider. ? Only take cough medicine if you are losing sleep. Be aware that cough medicine can prevent your body's natural ability to remove mucus from your lungs.  If you were prescribed an antibiotic   medicine, take it as told by your health care provider. Do not stop taking the antibiotic even if you start to feel better. General instructions  Sleep in a semi-upright position at night. Try  sleeping in a reclining chair, or place a few pillows under your head.  Rest as needed and get at least 8 hours of sleep each night.  Drink enough water to keep your urine pale yellow. This will help to thin out mucus secretions in your lungs.  Eat a healthy diet that includes plenty of vegetables, fruits, whole grains, low-fat dairy products, and lean protein.  Do not use any products that contain nicotine or tobacco, such as cigarettes, e-cigarettes, and chewing tobacco. If you need help quitting, ask your health care provider.  Keep all follow-up visits as told by your health care provider. This is important. How is this prevented? You can lower your risk of developing community-acquired pneumonia by:  Getting a pneumococcal vaccine. There are different types and schedules of pneumococcal vaccines. Ask your health care provider which option is best for you. Consider getting the vaccine if: ? You are older than 32 years of age. ? You are older than 32 years of age and are undergoing cancer treatment, have chronic lung disease, or have other medical conditions that affect your immune system. Ask your health care provider if this applies to you.  Getting an influenza vaccine every year. Ask your health care provider which type of vaccine is best for you.  Getting regular checkups from your dentist.  Washing your hands often. If soap and water are not available, use hand sanitizer. Contact a health care provider if:  You have a fever.  You are losing sleep because you cannot control your cough with cough medicine. Get help right away if:  You have worsening shortness of breath.  You have increased chest pain.  Your sickness becomes worse, especially if you are an older adult or have a weakened immune system.  You cough up blood. Summary  Pneumonia is an infection of the lungs.  Community-acquired pneumonia develops in people who have not been in the hospital. It can be caused  by bacteria, viruses, or fungi.  This condition may be treated with antibiotics or antiviral medicines.  Severe cases may require hospitalization, mechanical ventilation, and other procedures to drain fluid from the lungs. This information is not intended to replace advice given to you by your health care provider. Make sure you discuss any questions you have with your health care provider. Document Revised: 12/12/2017 Document Reviewed: 12/12/2017 Elsevier Patient Education  2020 [employer]  

## 2019-10-02 NOTE — Discharge Summary (Signed)
Physician Discharge Summary  Jordan [name] XLK:[id number] DOB: 04/25/1986 DOA: 09/28/2019  PCP: Marya Fossa, PA-C  Admit date: 09/28/2019 Discharge date: 10/02/2019  Admitted From: home Disposition:  home  Recommendations for Outpatient Follow-up:  1. Follow up with PCP in 1-2 weeks 2. Please obtain BMP/CBC in one week 3. Please follow up at [facility]  Call to make an appointment. 4. Follow up with pulmonology as previously scheduled. 5. Please follow up on pending studies: serum Fungitell, and respiratory cultures   Home Health: no  Equipment/Devices: none   Discharge Condition: stable  CODE STATUS: full  Diet recommendation: Regular with fluid restriction   Discharge Diagnoses: Principal Problem:   Sepsis (HCC) Active Problems:   Healthcare-associated pneumonia   Sinus tachycardia   Hypokalemia    Summary of HPI and Hospital Course:  Jordan [name] is a 34 y.o. female with medical history significant of seizures, COVID-19 infection about one year ago with frequent respiratory infections, tachycardia and persistent fatigue since then.  Presented to the ED on 09/28/19 with fevers, chills, shortness of breath and cough.  Patient had a repeat Covid test which was negative.  Chest x-ray showed shallow inspiration with bibasilar atelectasis versus less likely infiltrates.  CTA chest was negative for PE but showed multifocal patchy airspace opacities in both lung bases.  Of note, patient had recently completed 10-day course of Levaquin as outpatient without improvement in her symptoms.  In the ED, febrile 101.4 F, hypertensive 149/101, tachycardic heart rate 150, tachypneic respirations 24, oxygen sat 94% on 2 L/min labs notable for leukocytosis 13.7k, normal lactic acid.  Admitted to hospitalist service for further management of healthcare associated pneumonia, post Covid-19 infection.      Sepsis secondary to multifocal pneumonia - Sepsis present on admission  with fever, tachycardia, tachypnea, leukocytosis in setting of pneumonia.  No end organ dysfunction, normal lactic acid.  Treated per sepsis protocol with IV fluids and antibiotics in the ED.  Failed 10 day outpatient Levaquin course.  Treated as infectious/HCAP, but inflammatory post-Covid changes also in differential.  Respiratory viral panel Negative.  Treated with Cefepime, transitioned to doxycycline.  Prednisone trial given history of prior improvement with steroid.  CTA chest negative for PE on 5/31.  Pulmonology consulted, appreciate their assistance. Some interstitial edema on imaging, trace pleural effusions as well.  Patient was diuresed with IV Lasix with some improvement.  MetaNeb therapy for recruitment of atelectatic segments (patient did not tolerate well).   Anti-fibrotic therapy to be considered in outpatient follow up.  [facility] was contacted for potential transfer per patient and family request.  They were on full diversion, unable to accept, but offered referral to their Post-Covid clinic which patient agreed to.   Hypokalemia -present on admission, replaced.  Monitor daily, replace as needed.  Sinus tachycardia -present on admission, secondary to sepsis.  Monitor.  Stable, but continues to have exertional tachycardia, improves with rest.  History of Seizures - none recent, and no acute issues.   Bacterial Vaginosis -recently diagnosed outpatient and prescribed intravaginal Flagyl cream which she had not started.  Likely this will be covered by antibiotics above.  Diflucan given per patient request, reports yeast infections with use of antibiotics.    Discharge Instructions   Discharge Instructions    Call MD for:  extreme fatigue   Complete by: As directed    Call MD for:  persistant dizziness or light-headedness   Complete by: As directed    Call MD for:  temperature >100.4   Complete by: As directed    Diet - low sodium heart healthy   Complete by: As directed     Discharge instructions   Complete by: As directed    Monitor your oxygen level and heart rate at home periodically, when you are feeling more short of breath or heart racing.  Oxygen level should be 90% or higher.  If it goes below 90% for a short time while you are moving around, but it comes up when you rest, that is okay.  If it stays below 90% when resting, you should call your doctor or come to the ED.  Heart rate target is less than 110.  It is okay if it goes higher with activity, as long as it comes down after you rest for 5-10 minutes.  If heart rate is staying above 110 for longer than 15-20 minutes when you are sitting still, then call your doctor of come to the ED.  [facility] - call [phone] - tell them you were referred to the Post-COVID clinic by Dr. Ladona Ridgel at [facility].  They will get you an appointment time scheduled.  Please follow up next week with Dr. Meredeth Ide.   Increase activity slowly   Complete by: As directed      Allergies as of 10/02/2019   No Known Allergies     Medication List    STOP taking these medications   chlorpheniramine-HYDROcodone 10-8 MG/5ML Suer Commonly known as: Tussionex Pennkinetic ER   levofloxacin 500 MG tablet Commonly known as: LEVAQUIN   methylPREDNISolone 4 MG Tbpk tablet Commonly known as: MEDROL DOSEPAK     TAKE these medications   albuterol 108 (90 Base) MCG/ACT inhaler Commonly known as: VENTOLIN HFA Inhale 2 puffs into the lungs every 6 (six) hours as needed for wheezing or shortness of breath.   albuterol (2.5 MG/3ML) 0.083% nebulizer solution Commonly known as: PROVENTIL Take 3 mLs (2.5 mg total) by nebulization every 4 (four) hours as needed for wheezing or shortness of breath.   budesonide-formoterol 160-4.5 MCG/ACT inhaler Commonly known as: SYMBICORT Inhale 2 puffs into the lungs 2 (two) times daily.   Compressor/Nebulizer Misc 1 Units by Does not apply route every 4 (four) hours as  needed.   doxycycline 100 MG tablet Commonly known as: VIBRA-TABS Take 1 tablet (100 mg total) by mouth every 12 (twelve) hours for 4 days.   fluconazole 150 MG tablet Commonly known as: Diflucan Take 1 tablet (150 mg total) by mouth daily for 1 day.   metroNIDAZOLE 0.75 % vaginal gel Commonly known as: METROGEL Place 1 Applicatorful vaginally at bedtime for 5 days.      Follow-up Information    Marya Fossa, PA-C. Schedule an appointment as soon as possible for a visit in 1 week(s).   Specialty: Physician Assistant Contact information: [address] [place] [address] [phone]        Mertie Moores, MD. Schedule an appointment as soon as possible for a visit in 4 day(s).   Specialty: Specialist Why: Hospital follow up as eary next week as possible. Contact information: [address] [place] [address] [phone]          No Known Allergies  Consultations:  Pulmonology   Procedures/Studies: CT Angio Chest PE W and/or Wo Contrast  Result Date: 09/28/2019 CLINICAL DATA:  Shortness of breath and cough, history of COVID EXAM: CT ANGIOGRAPHY CHEST WITH CONTRAST TECHNIQUE: Multidetector CT imaging of the  chest was performed using the standard protocol during bolus administration of intravenous contrast. Multiplanar CT image reconstructions and MIPs were obtained to evaluate the vascular anatomy. CONTRAST:  75mL OMNIPAQUE IOHEXOL 350 MG/ML SOLN COMPARISON:  March 16, 2019 FINDINGS: Cardiovascular: There is a optimal opacification of the pulmonary arteries. There is no central,segmental, or subsegmental filling defects within the pulmonary arteries. The heart is normal in size. No pericardial effusion or thickening. No evidence right heart strain. There is normal three-vessel brachiocephalic anatomy without proximal stenosis. The thoracic aorta is normal in appearance. Mediastinum/Nodes: Scattered pre-vascular and subcarinal lymph nodes are  seen, likely reactive. No hilar axillary adenopathy. Thyroid gland, trachea, and esophagus demonstrate no significant findings. Lungs/Pleura: Multifocal patchy airspace opacities are seen at both lung bases. There is a trace bilateral pleural effusions. Upper Abdomen: No acute abnormalities present in the visualized portions of the upper abdomen. Musculoskeletal: No chest wall abnormality. No acute or significant osseous findings. Review of the MIP images confirms the above findings. IMPRESSION: No central, segmental, or subsegmental pulmonary embolism. Multifocal patchy airspace opacities at both lung bases which may be due to multifocal infectious etiology. Electronically Signed   By: Jonna Clark M.D.   On: 09/28/2019 23:35   DG Chest Port 1 View  Result Date: 10/01/2019 CLINICAL DATA:  Recent presentation with fever, chills, shortness of breath, and cough EXAM: PORTABLE CHEST 1 VIEW COMPARISON:  09/28/2019 FINDINGS: Single frontal view of the chest demonstrates a stable cardiac silhouette. Scattered bibasilar consolidation again noted, though overall improved aeration at the lung bases since prior study. Small right pleural effusion again noted. There is no pneumothorax. IMPRESSION: 1. Improved aeration of the lung bases, with persistent scattered areas of consolidation as above. Differential remains atelectasis or Recurrent/residual airspace disease. 2. Trace right pleural effusion. Electronically Signed   By: Sharlet Salina M.D.   On: 10/01/2019 18:19   DG Chest Port 1 View  Result Date: 09/28/2019 CLINICAL DATA:  34 year old female with shortness of breath. EXAM: PORTABLE CHEST 1 VIEW COMPARISON:  Chest radiograph dated 07/04/2019. FINDINGS: Shallow inspiration with bibasilar atelectasis. Pneumonia is less likely. Clinical correlation is recommended. There is no pleural effusion or pneumothorax. The cardiac silhouette is within limits. No acute osseous pathology. IMPRESSION: Shallow inspiration with  bibasilar atelectasis, less likely infiltrate. Electronically Signed   By: Elgie Collard M.D.   On: 09/28/2019 22:44   ECHOCARDIOGRAM COMPLETE  Result Date: 10/01/2019    ECHOCARDIOGRAM REPORT   Patient Name:   Jordan [name] Date of Exam: 10/01/2019 Medical Rec #:  [id number]            Height:       67.0 in Accession #:    [id number]           Weight:       166.1 lb Date of Birth:  03-27-1986            BSA:          1.869 m Patient Age:    33 years             BP:           116/98 mmHg Patient Gender: F                    HR:           113 bpm. Exam Location:  [facility] Procedure: 2D Echo, Color Doppler and Cardiac Doppler Indications:     R01.2 Abnormal Heart Sounds Nec  History:         Patient has no prior history of Echocardiogram examinations.                  Signs/Symptoms:Anxiety. Pt tested positive for COVID-19 on                  09/02/2018.  Sonographer:     Humphrey Rolls RDCS (AE) Referring Phys:  [id number] Vida Rigger Diagnosing Phys: Lorine Bears MD  Sonographer Comments: No subcostal window. IMPRESSIONS  1. Left ventricular ejection fraction, by estimation, is 60 to 65%. The left ventricle has normal function. The left ventricle has no regional wall motion abnormalities. Left ventricular diastolic parameters were normal.  2. Right ventricular systolic function is normal. The right ventricular size is normal. Tricuspid regurgitation signal is inadequate for assessing PA pressure.  3. The mitral valve is normal in structure. No evidence of mitral valve regurgitation. No evidence of mitral stenosis.  4. The aortic valve is normal in structure. Aortic valve regurgitation is not visualized. No aortic stenosis is present.  5. The inferior vena cava is normal in size with greater than 50% respiratory variability, suggesting right atrial pressure of 3 mmHg. FINDINGS  Left Ventricle: Left ventricular ejection fraction, by estimation, is 60 to 65%. The left ventricle has normal function. The left ventricle  has no regional wall motion abnormalities. The left ventricular internal cavity size was normal in size. There is  no left ventricular hypertrophy. Left ventricular diastolic parameters were normal. Right Ventricle: The right ventricular size is normal. No increase in right ventricular wall thickness. Right ventricular systolic function is normal. Tricuspid regurgitation signal is inadequate for assessing PA pressure. Left Atrium: Left atrial size was normal in size. Right Atrium: Right atrial size was normal in size. Pericardium: There is no evidence of pericardial effusion. Mitral Valve: The mitral valve is normal in structure. Normal mobility of the mitral valve leaflets. No evidence of mitral valve regurgitation. No evidence of mitral valve stenosis. MV peak gradient, 1.9 mmHg. The mean mitral valve gradient is 1.0 mmHg. Tricuspid Valve: The tricuspid valve is normal in structure. Tricuspid valve regurgitation is not demonstrated. No evidence of tricuspid stenosis. Aortic Valve: The aortic valve is normal in structure. Aortic valve regurgitation is not visualized. No aortic stenosis is present. Aortic valve mean gradient measures 1.0 mmHg. Aortic valve peak gradient measures 2.5 mmHg. Aortic valve area, by VTI measures 1.63 cm. Pulmonic Valve: The pulmonic valve was normal in structure. Pulmonic valve regurgitation is not visualized. No evidence of pulmonic stenosis. Aorta: The aortic root is normal in size and structure. Venous: The inferior vena cava is normal in size with greater than 50% respiratory variability, suggesting right atrial pressure of 3 mmHg. IAS/Shunts: No atrial level shunt detected by color flow Doppler.  LEFT VENTRICLE PLAX 2D LVIDd:         3.36 cm  Diastology LVIDs:         2.11 cm  LV e' lateral:   8.70 cm/s LV PW:         0.98 cm  LV E/e' lateral: 5.4 LV IVS:        0.66 cm  LV e' medial:    7.83 cm/s LVOT diam:     1.90 cm  LV E/e' medial:  6.0 LV SV:         24 LV SV Index:   13 LVOT  Area:     2.84 cm  RIGHT VENTRICLE RV Basal diam:  2.61 cm LEFT ATRIUM           Index      RIGHT ATRIUM          Index LA diam:      2.00 cm 1.07 cm/m RA Area:     4.27 cm LA Vol (A4C): 11.0 ml 5.89 ml/m RA Volume:   5.39 ml  2.88 ml/m  AORTIC VALVE                   PULMONIC VALVE AV Area (Vmax):    2.12 cm    PV Vmax:       0.75 m/s AV Area (Vmean):   1.82 cm    PV Vmean:      46.500 cm/s AV Area (VTI):     1.63 cm    PV VTI:        0.096 m AV Vmax:           79.20 cm/s  PV Peak grad:  2.3 mmHg AV Vmean:          56.000 cm/s PV Mean grad:  1.0 mmHg AV VTI:            0.144 m AV Peak Grad:      2.5 mmHg AV Mean Grad:      1.0 mmHg LVOT Vmax:         59.10 cm/s LVOT Vmean:        35.900 cm/s LVOT VTI:          0.083 m LVOT/AV VTI ratio: 0.58  AORTA Ao Root diam: 3.10 cm MITRAL VALVE MV Area (PHT): 7.99 cm    SHUNTS MV Peak grad:  1.9 mmHg    Systemic VTI:  0.08 m MV Mean grad:  1.0 mmHg    Systemic Diam: 1.90 cm MV Vmax:       0.70 m/s MV Vmean:      51.2 cm/s MV Decel Time: 95 msec MV E velocity: 47.10 cm/s MV A velocity: 53.40 cm/s MV E/A ratio:  0.88 Lorine Bears MD Electronically signed by Lorine Bears MD Signature Date/Time: 10/01/2019/11:12:48 AM    Final        Subjective: Patient seen with husband and mother at bedside.  She reports feeling okay, a little better.  Still feels her heart race when she's up moving around.  Concerned about returning to work, requests letter.  No fevers or chills or other new or worsening complaints.   Discharge Exam: Vitals:   10/02/19 1133 10/02/19 1522  BP: 112/86 116/89  Pulse: (!) 109 (!) 106  Resp: 20 17  Temp: 98.4 F (36.9 C) 98.9 F (37.2 C)  SpO2: 96% 96%   Vitals:   10/02/19 0635 10/02/19 0732 10/02/19 1133 10/02/19 1522  BP: 105/83 110/86 112/86 116/89  Pulse: 95 99 (!) 109 (!) 106  Resp: Temp: 98.4 F (36.9 C) 98.5 F (36.9 C) 98.4 F (36.9 C) 98.9 F (37.2 C)  TempSrc: Oral Oral Oral Oral  SpO2: 97% 97% 96% 96%   Weight: 72.6 kg     Height:        General: Pt is alert, awake, not in acute distress Cardiovascular: RRR, S1/S2 +, no rubs, no gallops Respiratory: decreased breath sounds at the bases but overall clear bilaterally, no wheezing, no rhonchi Abdominal: Soft, NT, ND, bowel sounds + Extremities: no edema, no cyanosis    The results of significant diagnostics from this hospitalization (including imaging, microbiology,  ancillary and laboratory) are listed below for reference.     Microbiology: Recent Results (from the past 240 hour(s))  Blood Culture (routine x 2)     Status: None (Preliminary result)   Collection Time: 09/28/19 10:22 PM   Specimen: BLOOD RIGHT FOREARM  Result Value Ref Range Status   Specimen Description BLOOD RIGHT FOREARM  Final   Special Requests   Final    BOTTLES DRAWN AEROBIC AND ANAEROBIC Blood Culture adequate volume   Culture   Final    NO GROWTH 4 DAYS Performed at [facility], [address] [place], [place] [address]    Report Status PENDING  Incomplete  Urine culture     Status: None   Collection Time: 09/28/19 10:22 PM   Specimen: In/Out Cath Urine  Result Value Ref Range Status   Specimen Description   Final    IN/OUT CATH URINE Performed at [facility], [address] [place], [place] [address]    Special Requests   Final    NONE Performed at [facility], [address] [place], [place] [address]    Culture   Final    NO GROWTH Performed at [facility], [address] [place], [place] [address]    Report Status 09/30/2019 FINAL  Final  SARS Coronavirus 2 by RT PCR (hospital order, performed in [facility] hospital lab) Nasopharyngeal     Status: None   Collection Time: 09/28/19 10:22 PM   Specimen: Nasopharyngeal  Result Value Ref Range Status   SARS Coronavirus 2 NEGATIVE NEGATIVE Final    Comment: (NOTE) SARS-CoV-2 target nucleic acids are NOT DETECTED. The SARS-CoV-2 RNA is generally  detectable in upper and lower respiratory specimens during the acute phase of infection. The lowest concentration of SARS-CoV-2 viral copies this assay can detect is 250 copies / mL. A negative result does not preclude SARS-CoV-2 infection and should not be used as the sole basis for treatment or other patient management decisions.  A negative result may occur with improper specimen collection / handling, submission of specimen other than nasopharyngeal swab, presence of viral mutation(s) within the areas targeted by this assay, and inadequate number of viral copies (<250 copies / mL). A negative result must be combined with clinical observations, patient history, and epidemiological information. Fact Sheet for Patients:   BoilerBrush.com.cyhttps://www.fda.gov/media/136312/download Fact Sheet for Healthcare Providers: https://pope.com/https://www.fda.gov/media/136313/download This test is not yet approved or cleared  by the [place] FDA and has been authorized for detection and/or diagnosis of SARS-CoV-2 by FDA under an Emergency Use Authorization (EUA).  This EUA will remain in effect (meaning this test can be used) for the duration of the COVID-19 declaration under Section 564(b)(1) of the Act, 21 U.S.C. section 360bbb-3(b)(1), unless the authorization is terminated or revoked sooner. Performed at [facility], [address] [place], [place] [address]   Blood Culture (routine x 2)     Status: None (Preliminary result)   Collection Time: 09/28/19 10:27 PM   Specimen: BLOOD LEFT FOREARM  Result Value Ref Range Status   Specimen Description BLOOD LEFT FOREARM  Final   Special Requests   Final    BOTTLES DRAWN AEROBIC AND ANAEROBIC Blood Culture adequate volume   Culture   Final    NO GROWTH 4 DAYS Performed at [facility], [address] [place], [place] [address]    Report Status PENDING  Incomplete  Culture, sputum-assessment     Status: None   Collection Time:  09/29/19  1:34 AM    Specimen: Sputum  Result Value Ref Range Status   Specimen Description SPU  Final   Special Requests NONE  Final   Sputum evaluation   Final    Sputum specimen not acceptable for testing.  Please recollect.   C/ CHRIS BUCKNER @ 0445 0N 09/29/2019 RH Performed at [facility], [address] [place], [place] [address]    Report Status 09/29/2019 FINAL  Final  MRSA PCR Screening     Status: None   Collection Time: 09/30/19 11:34 AM   Specimen: Nasal Mucosa; Nasopharyngeal  Result Value Ref Range Status   MRSA by PCR NEGATIVE NEGATIVE Final    Comment:        The GeneXpert MRSA Assay (FDA approved for NASAL specimens only), is one component of a comprehensive MRSA colonization surveillance program. It is not intended to diagnose MRSA infection nor to guide or monitor treatment for MRSA infections. Performed at [facility], [address] [place], [place] [address]   Respiratory Panel by PCR     Status: None   Collection Time: 10/01/19  8:30 AM  Result Value Ref Range Status   Adenovirus NOT DETECTED NOT DETECTED Final   Coronavirus 229E NOT DETECTED NOT DETECTED Final    Comment: (NOTE) The Coronavirus on the Respiratory Panel, DOES NOT test for the novel  Coronavirus (2019 nCoV)    Coronavirus HKU1 NOT DETECTED NOT DETECTED Final   Coronavirus NL63 NOT DETECTED NOT DETECTED Final   Coronavirus OC43 NOT DETECTED NOT DETECTED Final   Metapneumovirus NOT DETECTED NOT DETECTED Final   Rhinovirus / Enterovirus NOT DETECTED NOT DETECTED Final   Influenza A NOT DETECTED NOT DETECTED Final   Influenza B NOT DETECTED NOT DETECTED Final   Parainfluenza Virus 1 NOT DETECTED NOT DETECTED Final   Parainfluenza Virus 2 NOT DETECTED NOT DETECTED Final   Parainfluenza Virus 3 NOT DETECTED NOT DETECTED Final   Parainfluenza Virus 4 NOT DETECTED NOT DETECTED Final   Respiratory Syncytial Virus NOT DETECTED NOT DETECTED Final   Bordetella pertussis NOT DETECTED NOT  DETECTED Final   Chlamydophila pneumoniae NOT DETECTED NOT DETECTED Final   Mycoplasma pneumoniae NOT DETECTED NOT DETECTED Final    Comment: Performed at [facility], [address] [place], [place] [address]     Labs: BNP (last 3 results) Recent Labs    03/15/19 2323 09/28/19 2222  BNP 10.0 16.2   Basic Metabolic Panel: Recent Labs  Lab 09/28/19 2222 09/29/19 0444 09/30/19 0754 10/01/19 0537  NA 138 140 141 142  K 3.3* 3.5 3.8 3.7  CL 101 109 104 103  CO2 26 24 28 28   GLUCOSE 118* 120* 97 108*  BUN 11 10 8 10   CREATININE 1.04* 0.95 0.98 0.95  CALCIUM 9.5 8.1* 8.9 9.4  MG  --   --   --  2.1   Liver Function Tests: Recent Labs  Lab 09/28/19 2222 09/29/19 0444  AST 17 15  ALT 13 9  ALKPHOS 52 41  BILITOT 1.4* 1.5*  PROT 8.2* 6.2*  ALBUMIN 4.3 3.1*   No results for input(s): LIPASE, AMYLASE in the last 168 hours. No results for input(s): AMMONIA in the last 168 hours. CBC: Recent Labs  Lab 09/28/19 2222 09/29/19 0444 09/30/19 0754 10/01/19 0537  WBC 13.7* 14.3* 6.6 6.4  NEUTROABS 11.5*  --   --   --   HGB 13.7 11.3* 12.1 12.9  HCT 40.7 33.0* 36.3  37.7  MCV 92.1 92.2 92.8 91.1  PLT 329 276 304 326   Cardiac Enzymes: No results for input(s): CKTOTAL, CKMB, CKMBINDEX, TROPONINI in the last 168 hours. BNP: Invalid input(s): POCBNP CBG: No results for input(s): GLUCAP in the last 168 hours. D-Dimer No results for input(s): DDIMER in the last 72 hours. Hgb A1c No results for input(s): HGBA1C in the last 72 hours. Lipid Profile No results for input(s): CHOL, HDL, LDLCALC, TRIG, CHOLHDL, LDLDIRECT in the last 72 hours. Thyroid function studies No results for input(s): TSH, T4TOTAL, T3FREE, THYROIDAB in the last 72 hours.  Invalid input(s): FREET3 Anemia work up No results for input(s): VITAMINB12, FOLATE, FERRITIN, TIBC, IRON, RETICCTPCT in the last 72 hours. Urinalysis    Component Value Date/Time   COLORURINE YELLOW (A) 09/28/2019 2222    APPEARANCEUR CLOUDY (A) 09/28/2019 2222   LABSPEC 1.019 09/28/2019 2222   PHURINE 5.0 09/28/2019 2222   GLUCOSEU NEGATIVE 09/28/2019 2222   HGBUR NEGATIVE 09/28/2019 2222   BILIRUBINUR NEGATIVE 09/28/2019 2222   KETONESUR NEGATIVE 09/28/2019 2222   PROTEINUR NEGATIVE 09/28/2019 2222   UROBILINOGEN 1.0 06/12/2013 1359   NITRITE NEGATIVE 09/28/2019 2222   LEUKOCYTESUR NEGATIVE 09/28/2019 2222   Sepsis Labs Invalid input(s): PROCALCITONIN,  WBC,  LACTICIDVEN Microbiology Recent Results (from the past 240 hour(s))  Blood Culture (routine x 2)     Status: None (Preliminary result)   Collection Time: 09/28/19 10:22 PM   Specimen: BLOOD RIGHT FOREARM  Result Value Ref Range Status   Specimen Description BLOOD RIGHT FOREARM  Final   Special Requests   Final    BOTTLES DRAWN AEROBIC AND ANAEROBIC Blood Culture adequate volume   Culture   Final    NO GROWTH 4 DAYS Performed at [facility], [address], [place], [place] [address]    Report Status PENDING  Incomplete  Urine culture     Status: None   Collection Time: 09/28/19 10:22 PM   Specimen: In/Out Cath Urine  Result Value Ref Range Status   Specimen Description   Final    IN/OUT CATH URINE Performed at [facility], [address], [place], [place] [address]    Special Requests   Final    NONE Performed at [facility], [address] [place], [place] [address]    Culture   Final    NO GROWTH Performed at [facility], [place] [address] [place],  [address]    Report Status 09/30/2019 FINAL  Final  SARS Coronavirus 2 by RT PCR (hospital order, performed in [facility] hospital lab) Nasopharyngeal     Status: None   Collection Time: 09/28/19 10:22 PM   Specimen: Nasopharyngeal  Result Value Ref Range Status   SARS Coronavirus 2 NEGATIVE NEGATIVE Final    Comment: (NOTE) SARS-CoV-2 target nucleic acids are NOT DETECTED. The SARS-CoV-2 RNA is generally detectable in upper and  lower respiratory specimens during the acute phase of infection. The lowest concentration of SARS-CoV-2 viral copies this assay can detect is 250 copies / mL. A negative result does not preclude SARS-CoV-2 infection and should not be used as the sole basis for treatment or other patient management decisions.  A negative result may occur with improper specimen collection / handling, submission of specimen other than nasopharyngeal swab, presence of viral mutation(s) within the areas targeted by this assay, and inadequate number of viral copies (<250 copies / mL). A negative result must be combined with clinical observations, patient history, and epidemiological  information. Fact Sheet for Patients:   BoilerBrush.com.cy Fact Sheet for Healthcare Providers: https://pope.com/ This test is not yet approved or cleared  by the [place] FDA and has been authorized for detection and/or diagnosis of SARS-CoV-2 by FDA under an Emergency Use Authorization (EUA).  This EUA will remain in effect (meaning this test can be used) for the duration of the COVID-19 declaration under Section 564(b)(1) of the Act, 21 U.S.C. section 360bbb-3(b)(1), unless the authorization is terminated or revoked sooner. Performed at [facility], [address] [place], [place] [address]   Blood Culture (routine x 2)     Status: None (Preliminary result)   Collection Time: 09/28/19 10:27 PM   Specimen: BLOOD LEFT FOREARM  Result Value Ref Range Status   Specimen Description BLOOD LEFT FOREARM  Final   Special Requests   Final    BOTTLES DRAWN AEROBIC AND ANAEROBIC Blood Culture adequate volume   Culture   Final    NO GROWTH 4 DAYS Performed at [facility], [address], [place], [place] [address]    Report Status PENDING  Incomplete  Culture, sputum-assessment     Status: None   Collection Time: 09/29/19  1:34 AM   Specimen: Sputum  Result  Value Ref Range Status   Specimen Description SPU  Final   Special Requests NONE  Final   Sputum evaluation   Final    Sputum specimen not acceptable for testing.  Please recollect.   C/ CHRIS BUCKNER @ 0445 0N 09/29/2019 RH Performed at [facility], [address] [place], [place] [address]    Report Status 09/29/2019 FINAL  Final  MRSA PCR Screening     Status: None   Collection Time: 09/30/19 11:34 AM   Specimen: Nasal Mucosa; Nasopharyngeal  Result Value Ref Range Status   MRSA by PCR NEGATIVE NEGATIVE Final    Comment:        The GeneXpert MRSA Assay (FDA approved for NASAL specimens only), is one component of a comprehensive MRSA colonization surveillance program. It is not intended to diagnose MRSA infection nor to guide or monitor treatment for MRSA infections. Performed at [facility], [address] [place], [place] [address]   Respiratory Panel by PCR     Status: None   Collection Time: 10/01/19  8:30 AM  Result Value Ref Range Status   Adenovirus NOT DETECTED NOT DETECTED Final   Coronavirus 229E NOT DETECTED NOT DETECTED Final    Comment: (NOTE) The Coronavirus on the Respiratory Panel, DOES NOT test for the novel  Coronavirus (2019 nCoV)    Coronavirus HKU1 NOT DETECTED NOT DETECTED Final   Coronavirus NL63 NOT DETECTED NOT DETECTED Final   Coronavirus OC43 NOT DETECTED NOT DETECTED Final   Metapneumovirus NOT DETECTED NOT DETECTED Final   Rhinovirus / Enterovirus NOT DETECTED NOT DETECTED Final   Influenza A NOT DETECTED NOT DETECTED Final   Influenza B NOT DETECTED NOT DETECTED Final   Parainfluenza Virus 1 NOT DETECTED NOT DETECTED Final   Parainfluenza Virus 2 NOT DETECTED NOT DETECTED Final   Parainfluenza Virus 3 NOT DETECTED NOT DETECTED Final   Parainfluenza Virus 4 NOT DETECTED NOT DETECTED Final   Respiratory Syncytial Virus NOT DETECTED NOT DETECTED Final   Bordetella pertussis NOT DETECTED NOT DETECTED Final    Chlamydophila pneumoniae NOT DETECTED NOT DETECTED Final   Mycoplasma pneumoniae NOT DETECTED NOT DETECTED Final    Comment: Performed at [facility], [address],  [place], [place] [address]     Time coordinating discharge: Over 30 minutes  SIGNED:   Pennie Banter, DO [facility] 10/02/2019, 3:49 PM   If 7PM-7AM, please contact night-coverage [email or web address]

## 2019-10-02 NOTE — Progress Notes (Signed)
Pulmonary Medicine          Date: 10/02/2019,   MRN# [id number] Rich ReiningMartica T Richardson Feb 25, 1986     AdmissionWeight: 74.8 kg                 CurrentWeight: 72.6 kg  Referring physician: Dr. Denton LankGriffith   CHIEF COMPLAINT:   Persistent respiratory distress post COVID-19 pneumonia   SUBJECTIVE   Patient is improved she states she can tell a difference in her breathing.  She has been on room air with adequate sPO2 over past 48h.     Unclear if there is infectious component to her respiratory complaints due to negative workup thus far. Patient did have 4 bursts of prednisone over past 6 months which may predispose to endemic infection such as PCP/fungal pneumonia and fungitell is still pending.    She has not had additional fevers although tylenol has been on daily regimen PRN.    There are additional plans for spirometry today to evaluate decrement in lung function and elucidate reversible component that may be indicative of reactive airway etiology.   Have changed antibiotic from cefepime to doxy BID due to mild illness and still negative workup for evidence of infection at this time.   Mild pulmonary interstitial edema, trace pleural effusions, and atelectasis have improved. The basilar segments with what is likely pneumonitis post covid may be permanent.   PAST MEDICAL HISTORY   Past Medical History:  Diagnosis Date  . Blood transfusion without reported diagnosis   . COVID-19 09/02/2018  . Seizures (HCC)      SURGICAL HISTORY   History reviewed. No pertinent surgical history.   FAMILY HISTORY   Family History  Problem Relation Age of Onset  . Healthy Mother   . Migraines Mother   . Healthy Father      SOCIAL HISTORY   Social History   Tobacco Use  . Smoking status: Never Smoker  . Smokeless tobacco: Never Used  Substance Use Topics  . Alcohol use: No  . Drug use: No     MEDICATIONS    Home Medication:    Current Medication:  Current  Facility-Administered Medications:  .  acetaminophen (TYLENOL) tablet 650 mg, 650 mg, Oral, Q6H PRN, Jimmye Normanuma, Elizabeth Achieng, NP, 650 mg at 09/29/19 2104 .  Chlorhexidine Gluconate Cloth 2 % PADS 6 each, 6 each, Topical, Daily, Esaw GrandchildGriffith, Kelly A, DO .  doxycycline (VIBRA-TABS) tablet 100 mg, 100 mg, Oral, Q12H, Lorren Rossetti, MD .  enoxaparin (LOVENOX) injection 40 mg, 40 mg, Subcutaneous, Q24H, Garba, Mohammad L, MD, 40 mg at 10/02/19 0054 .  furosemide (LASIX) injection 40 mg, 40 mg, Intravenous, Daily, Vida RiggerAleskerov, Alta Goding, MD, 40 mg at 10/01/19 0905 .  ondansetron (ZOFRAN) injection 4 mg, 4 mg, Intravenous, Q6H PRN, Esaw GrandchildGriffith, Kelly A, DO, 4 mg at 10/01/19 1518 .  sodium chloride flush (NS) 0.9 % injection 10-40 mL, 10-40 mL, Intracatheter, PRN, Esaw GrandchildGriffith, Kelly A, DO .  traZODone (DESYREL) tablet 25 mg, 25 mg, Oral, QHS PRN, Pieter Partridgehatterjee, Srobona Tublu, MD, 25 mg at 09/29/19 2105    ALLERGIES   Patient has no known allergies.     REVIEW OF SYSTEMS    Review of Systems:  Gen:  Denies  fever, sweats, chills weigh loss  HEENT: Denies blurred vision, double vision, ear pain, eye pain, hearing loss, nose bleeds, sore throat Cardiac:  No dizziness, chest pain or heaviness, chest tightness,edema Resp:   Denies cough or sputum porduction, shortness of breath,wheezing, hemoptysis,  Gi: Denies swallowing difficulty, stomach pain, nausea or vomiting, diarrhea, constipation, bowel incontinence Gu:  Denies bladder incontinence, burning urine Ext:   Denies Joint pain, stiffness or swelling Skin: Denies  skin rash, easy bruising or bleeding or hives Endoc:  Denies polyuria, polydipsia , polyphagia or weight change Psych:   Denies depression, insomnia or hallucinations   Other:  All other systems negative   VS: BP 110/86 (BP Location: Right Arm)   Pulse 99   Temp 98.5 F (36.9 C) (Oral)   Resp 19   Ht 5\' 7"  (1.702 m)   Wt 72.6 kg   LMP 09/15/2019 Comment: neg preg test  SpO2 97%   BMI  25.07 kg/m      PHYSICAL EXAM    GENERAL:NAD, no fevers, chills, no weakness no fatigue HEAD: Normocephalic, atraumatic.  EYES: Pupils equal, round, reactive to light. Extraocular muscles intact. No scleral icterus.  MOUTH: Moist mucosal membrane. Dentition intact. No abscess noted.  EAR, NOSE, THROAT: Clear without exudates. No external lesions.  NECK: Supple. No thyromegaly. No nodules. No JVD.  PULMONARY: clear to auscultation bilaterally  CARDIOVASCULAR: S1 and S2. Regular rate and rhythm. No murmurs, rubs, or gallops. No edema. Pedal pulses 2+ bilaterally.  GASTROINTESTINAL: Soft, nontender, nondistended. No masses. Positive bowel sounds. No hepatosplenomegaly.  MUSCULOSKELETAL: No swelling, clubbing, or edema. Range of motion full in all extremities.  NEUROLOGIC: Cranial nerves II through XII are intact. No gross focal neurological deficits. Sensation intact. Reflexes intact.  SKIN: No ulceration, lesions, rashes, or cyanosis. Skin warm and dry. Turgor intact.  PSYCHIATRIC: Mood, affect within normal limits. The patient is awake, alert and oriented x 3. Insight, judgment intact.       IMAGING    CT Angio Chest PE W and/or Wo Contrast  Result Date: 09/28/2019 CLINICAL DATA:  Shortness of breath and cough, history of COVID EXAM: CT ANGIOGRAPHY CHEST WITH CONTRAST TECHNIQUE: Multidetector CT imaging of the chest was performed using the standard protocol during bolus administration of intravenous contrast. Multiplanar CT image reconstructions and MIPs were obtained to evaluate the vascular anatomy. CONTRAST:  21mL OMNIPAQUE IOHEXOL 350 MG/ML SOLN COMPARISON:  March 16, 2019 FINDINGS: Cardiovascular: There is a optimal opacification of the pulmonary arteries. There is no central,segmental, or subsegmental filling defects within the pulmonary arteries. The heart is normal in size. No pericardial effusion or thickening. No evidence right heart strain. There is normal three-vessel  brachiocephalic anatomy without proximal stenosis. The thoracic aorta is normal in appearance. Mediastinum/Nodes: Scattered pre-vascular and subcarinal lymph nodes are seen, likely reactive. No hilar axillary adenopathy. Thyroid gland, trachea, and esophagus demonstrate no significant findings. Lungs/Pleura: Multifocal patchy airspace opacities are seen at both lung bases. There is a trace bilateral pleural effusions. Upper Abdomen: No acute abnormalities present in the visualized portions of the upper abdomen. Musculoskeletal: No chest wall abnormality. No acute or significant osseous findings. Review of the MIP images confirms the above findings. IMPRESSION: No central, segmental, or subsegmental pulmonary embolism. Multifocal patchy airspace opacities at both lung bases which may be due to multifocal infectious etiology. Electronically Signed   By: March 18, 2019 M.D.   On: 09/28/2019 23:35   DG Chest Port 1 View  Result Date: 10/01/2019 CLINICAL DATA:  Recent presentation with fever, chills, shortness of breath, and cough EXAM: PORTABLE CHEST 1 VIEW COMPARISON:  09/28/2019 FINDINGS: Single frontal view of the chest demonstrates a stable cardiac silhouette. Scattered bibasilar consolidation again noted, though overall improved aeration at the lung bases since prior  study. Small right pleural effusion again noted. There is no pneumothorax. IMPRESSION: 1. Improved aeration of the lung bases, with persistent scattered areas of consolidation as above. Differential remains atelectasis or Recurrent/residual airspace disease. 2. Trace right pleural effusion. Electronically Signed   By: Sharlet Salina M.D.   On: 10/01/2019 18:19   DG Chest Port 1 View  Result Date: 09/28/2019 CLINICAL DATA:  34 year old female with shortness of breath. EXAM: PORTABLE CHEST 1 VIEW COMPARISON:  Chest radiograph dated 07/04/2019. FINDINGS: Shallow inspiration with bibasilar atelectasis. Pneumonia is less likely. Clinical correlation is  recommended. There is no pleural effusion or pneumothorax. The cardiac silhouette is within limits. No acute osseous pathology. IMPRESSION: Shallow inspiration with bibasilar atelectasis, less likely infiltrate. Electronically Signed   By: Elgie Collard M.D.   On: 09/28/2019 22:44   ECHOCARDIOGRAM COMPLETE  Result Date: 10/01/2019    ECHOCARDIOGRAM REPORT   Patient Name:   MCKINZIE SAKSA Date of Exam: 10/01/2019 Medical Rec #:  [id number]            Height:       67.0 in Accession #:    [id number]           Weight:       166.1 lb Date of Birth:  1985/08/30            BSA:          1.869 m Patient Age:    33 years             BP:           116/98 mmHg Patient Gender: F                    HR:           113 bpm. Exam Location:  [facility] Procedure: 2D Echo, Color Doppler and Cardiac Doppler Indications:     R01.2 Abnormal Heart Sounds Nec  History:         Patient has no prior history of Echocardiogram examinations.                  Signs/Symptoms:Anxiety. Pt tested positive for COVID-19 on                  09/02/2018.  Sonographer:     Humphrey Rolls RDCS (AE) Referring Phys:  [id number] Vida Rigger Diagnosing Phys: Lorine Bears MD  Sonographer Comments: No subcostal window. IMPRESSIONS  1. Left ventricular ejection fraction, by estimation, is 60 to 65%. The left ventricle has normal function. The left ventricle has no regional wall motion abnormalities. Left ventricular diastolic parameters were normal.  2. Right ventricular systolic function is normal. The right ventricular size is normal. Tricuspid regurgitation signal is inadequate for assessing PA pressure.  3. The mitral valve is normal in structure. No evidence of mitral valve regurgitation. No evidence of mitral stenosis.  4. The aortic valve is normal in structure. Aortic valve regurgitation is not visualized. No aortic stenosis is present.  5. The inferior vena cava is normal in size with greater than 50% respiratory variability, suggesting right atrial  pressure of 3 mmHg. FINDINGS  Left Ventricle: Left ventricular ejection fraction, by estimation, is 60 to 65%. The left ventricle has normal function. The left ventricle has no regional wall motion abnormalities. The left ventricular internal cavity size was normal in size. There is  no left ventricular hypertrophy. Left ventricular diastolic parameters were normal. Right Ventricle: The right ventricular size is normal.  No increase in right ventricular wall thickness. Right ventricular systolic function is normal. Tricuspid regurgitation signal is inadequate for assessing PA pressure. Left Atrium: Left atrial size was normal in size. Right Atrium: Right atrial size was normal in size. Pericardium: There is no evidence of pericardial effusion. Mitral Valve: The mitral valve is normal in structure. Normal mobility of the mitral valve leaflets. No evidence of mitral valve regurgitation. No evidence of mitral valve stenosis. MV peak gradient, 1.9 mmHg. The mean mitral valve gradient is 1.0 mmHg. Tricuspid Valve: The tricuspid valve is normal in structure. Tricuspid valve regurgitation is not demonstrated. No evidence of tricuspid stenosis. Aortic Valve: The aortic valve is normal in structure. Aortic valve regurgitation is not visualized. No aortic stenosis is present. Aortic valve mean gradient measures 1.0 mmHg. Aortic valve peak gradient measures 2.5 mmHg. Aortic valve area, by VTI measures 1.63 cm. Pulmonic Valve: The pulmonic valve was normal in structure. Pulmonic valve regurgitation is not visualized. No evidence of pulmonic stenosis. Aorta: The aortic root is normal in size and structure. Venous: The inferior vena cava is normal in size with greater than 50% respiratory variability, suggesting right atrial pressure of 3 mmHg. IAS/Shunts: No atrial level shunt detected by color flow Doppler.  LEFT VENTRICLE PLAX 2D LVIDd:         3.36 cm  Diastology LVIDs:         2.11 cm  LV e' lateral:   8.70 cm/s LV PW:          0.98 cm  LV E/e' lateral: 5.4 LV IVS:        0.66 cm  LV e' medial:    7.83 cm/s LVOT diam:     1.90 cm  LV E/e' medial:  6.0 LV SV:         24 LV SV Index:   13 LVOT Area:     2.84 cm  RIGHT VENTRICLE RV Basal diam:  2.61 cm LEFT ATRIUM           Index      RIGHT ATRIUM          Index LA diam:      2.00 cm 1.07 cm/m RA Area:     4.27 cm LA Vol (A4C): 11.0 ml 5.89 ml/m RA Volume:   5.39 ml  2.88 ml/m  AORTIC VALVE                   PULMONIC VALVE AV Area (Vmax):    2.12 cm    PV Vmax:       0.75 m/s AV Area (Vmean):   1.82 cm    PV Vmean:      46.500 cm/s AV Area (VTI):     1.63 cm    PV VTI:        0.096 m AV Vmax:           79.20 cm/s  PV Peak grad:  2.3 mmHg AV Vmean:          56.000 cm/s PV Mean grad:  1.0 mmHg AV VTI:            0.144 m AV Peak Grad:      2.5 mmHg AV Mean Grad:      1.0 mmHg LVOT Vmax:         59.10 cm/s LVOT Vmean:        35.900 cm/s LVOT VTI:          0.083 m LVOT/AV VTI  ratio: 0.58  AORTA Ao Root diam: 3.10 cm MITRAL VALVE MV Area (PHT): 7.99 cm    SHUNTS MV Peak grad:  1.9 mmHg    Systemic VTI:  0.08 m MV Mean grad:  1.0 mmHg    Systemic Diam: 1.90 cm MV Vmax:       0.70 m/s MV Vmean:      51.2 cm/s MV Decel Time: 95 msec MV E velocity: 47.10 cm/s MV A velocity: 53.40 cm/s MV E/A ratio:  0.88 Lorine Bears MD Electronically signed by Lorine Bears MD Signature Date/Time: 10/01/2019/11:12:48 AM    Final           ASSESSMENT/PLAN   Acute on chronic hypoxemic respiratory failure -In the context of febrile illness and abnormal CT chest post COVID-19 pneumonia several months ago -Differential to include infectious versus inflammatory etiology including fungal/bacterial/viral lower respiratory tract infection, as well as bibasilar fibrosis and atelectasis as evidenced above with eventration of right hemidiaphragm -Respiratory cultures-and process -Respiratory viral panel-negative -Blood cultures collected 09/28/2019-negative to date - will consider bronchoscopy if  invasive specimen is necessary  -Serum Fungitell -Recruitment maneuvers with MetaNeb therapy using saline to recruit atelectatic segments -We will consider antifibrotic therapy on outpatient post discharge Currently on cefepime-MRSA nasal negative -No pulmonary thromboembolism on most recent CT chest-09/28/2019    Thank you for allowing me to participate in the care of this patient.   Patient/Family are satisfied with care plan and all questions have been answered.   This document was prepared using Dragon voice recognition software and may include unintentional dictation errors.     Vida Rigger, M.D.  Division of Pulmonary & Critical Care Medicine  [facility]

## 2019-10-02 NOTE — Evaluation (Signed)
Occupational Therapy Evaluation Patient Details Name: Jordan [name] MRN: [id number] DOB: Oct 31, 1985 Today's Date: 10/02/2019    History of Present Illness presented to ER secondary to SOB, chills, cough; admitted for management of sepsis, post-covid HCAP PNA.   Clinical Impression   Pt was seen for OT evaluation this date. Prior to hospital admission, pt was working some and continuing to recover from [employer] approx 1 year ago. Pt endorses intermittently getting sick since Covid and endorses that she may need to consider not working in her current role due to becoming sick frequently. Pt lives with her spouse in a 2 story home with her bed and bath upstairs. Pt notes they are trying to get a 1st floor home currently. Pt generally independent or modified independent with ADL mobility, requiring increased time to recover 2/2 increased HR. Pt demonstrates impairments as described below (See OT problem list) which functionally limit her ability to perform higher level ADL/self-care tasks and IADL tasks. Pt/spouse/mother educated in energy conservation strategies and post-Covid recovery plan to improve activity tolerance and support safe participation inADL and IADL, handout provided. Pt/family verbalized understanding.Pt would benefit from skilled OT to address noted impairments and functional limitations (see below for any additional details) in order to maximize safety and independence while minimizing falls risk and caregiver burden. Upon hospital discharge, recommend OP OT and cardiac/pulmonary rehab to maximize pt safety and return to functional independence.     Follow Up Recommendations  Other (comment);Outpatient OT(Cardiac Rehab and OP OT)    Equipment Recommendations  None recommended by OT    Recommendations for Other Services       Precautions / Restrictions Precautions Precautions: None Restrictions Weight Bearing Restrictions: No      Mobility Bed Mobility Overal bed  mobility: Independent                Transfers Overall transfer level: Independent                    Balance Overall balance assessment: Modified Independent                                         ADL either performed or assessed with clinical judgement   ADL Overall ADL's : Modified independent                                       General ADL Comments: Increased time and effort to perform 2/2 poor activity tolerance     Vision Patient Visual Report: No change from baseline       Perception     Praxis      Pertinent Vitals/Pain Pain Assessment: No/denies pain     Hand Dominance     Extremity/Trunk Assessment Upper Extremity Assessment Upper Extremity Assessment: Overall WFL for tasks assessed   Lower Extremity Assessment Lower Extremity Assessment: Overall WFL for tasks assessed       Communication Communication Communication: No difficulties   Cognition Arousal/Alertness: Awake/alert Behavior During Therapy: WFL for tasks assessed/performed Overall Cognitive Status: Within Functional Limits for tasks assessed                                     General Comments  SpO2 >95%  on room air, HR 106-117 at rest    Exercises Other Exercises Other Exercises: Pt/spouse/mother educated in energy conservation strategies and post-Covid recovery plan to improve activity tolerance and support safe participation inADL and IADL, handout provided   Shoulder Instructions      Home Living Family/patient expects to be discharged to:: Private residence Living Arrangements: Spouse/significant other Available Help at Discharge: Family Type of Home: House       Home Layout: Two level;Bed/bath upstairs Alternate Level Stairs-Number of Steps: full flight   Bathroom Shower/Tub: Chief Strategy Officer: Standard     Home Equipment: None          Prior Functioning/Environment Level of  Independence: Independent        Comments: Indep with ADLs, household and community mobilization; 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        OT Problem List: Cardiopulmonary status limiting activity;Decreased activity tolerance;Decreased knowledge of use of DME or AE      OT Treatment/Interventions: Self-care/ADL training;Therapeutic exercise;Therapeutic activities;Energy conservation;DME and/or AE instruction;Patient/family education    OT Goals(Current goals can be found in the care plan section) Acute Rehab OT Goals Patient Stated Goal: to get better and not feel bad anymore OT Goal Formulation: With patient/family Time For Goal Achievement: 10/16/19 Potential to Achieve Goals: Good ADL Goals Pt/caregiver will Perform Home Exercise Program: Increased strength;Both right and left upper extremity;Independently;With written HEP provided Additional ADL Goal #1: Pt/family will verbalize plan to implement at least 3 learned energy conservation strategies to maximize safety and indep with ADL and IADL.  OT Frequency: Min 1X/week   Barriers to D/C:            Co-evaluation              AM-PAC OT "6 Clicks" Daily Activity     Outcome Measure Help from another person eating meals?: None   Help from another person toileting, which includes using toliet, bedpan, or urinal?: None Help from another person bathing (including washing, rinsing, drying)?: A Little Help from another person to put on and taking off regular upper body clothing?: None Help from another person to put on and taking off regular lower body clothing?: None 6 Click Score: 19   End of Session    Activity Tolerance: Patient tolerated treatment well Patient left: in chair;with call bell/phone within reach;with family/visitor present  OT Visit Diagnosis: Other abnormalities of gait and mobility (R26.89)                Time: 1696-7893 OT Time Calculation (min): 30 min Charges:   OT General Charges $OT Visit: 1 Visit OT Evaluation $OT Eval Low Complexity: 1 Low OT Treatments $Self Care/Home Management : 8-22 mins $Therapeutic Exercise: 8-22 mins  Richrd Prime, MPH, MS, OTR/L ascom [phone] 10/02/19, 3:53 PM

## 2019-10-02 NOTE — Progress Notes (Signed)
Patient had attempted to call initial phone number provided by [facility] transfer center for referral to Post-Covid clinic.  I called back to transfer center and obtained the following contact information, which was provided to patient.  Call: [phone] Contact person: Mariane Masters (in pulmonology clinic) Patient to request appointment as referred by Dr. Ladona Ridgel, MD, at [facility].  Dr. Darreld Mclean is the hospitalist I consulted regarding potential transfer to [facility], per patient and family's request.  [facility] was on diversion at the time and she recommended referral to their Post-Covid clinic at [facility].  This was offered to patient, and she was agreeable and eager to get an appointment.  She is also to follow up with Dr. Meredeth Ide within a week if possible.

## 2019-10-02 NOTE — Progress Notes (Signed)
Pharmacy Antibiotic Note  Jordan [name] is a 34 y.o. female admitted on 09/28/2019 with SOB s/t multifocal pulmonary infiltrates.  Pharmacy has been consulted for cefepime dosing.  Vancomycin DCd on 6/2   Plan: Day 5 Abx. Continue cefepime 2g IV q8h per CrCl > 60 ml/min.  Will continue to monitor and adjust as necessary.   Height: 5\' 7"  (170.2 cm) Weight: 72.6 kg (160 lb 0.9 oz) IBW/kg (Calculated) : 61.6  Temp (24hrs), Avg:98.9 F (37.2 C), Min:98.4 F (36.9 C), Max:99.4 F (37.4 C)  Recent Labs  Lab 09/28/19 2222 09/29/19 0022 09/29/19 0444 09/30/19 0754 10/01/19 0537  WBC 13.7*  --  14.3* 6.6 6.4  CREATININE 1.04*  --  0.95 0.98 0.95  LATICACIDVEN 1.8 1.5  --   --   --   VANCOTROUGH  --   --   --  24*  --     Estimated Creatinine Clearance: 83.4 mL/min (by C-G formula based on SCr of 0.95 mg/dL).    No Known Allergies  Thank you for allowing pharmacy to be a part of this patient's care.  12/01/19, PharmD, BCPS Clinical Pharmacist 10/02/2019 8:41 AM

## 2019-10-02 NOTE — Plan of Care (Signed)
Discharge instructions completed with mother and patient with validation through teach back. Problem: Education: Goal: Knowledge of General Education information will improve Description: Including pain rating scale, medication(s)/side effects and non-pharmacologic comfort measures Outcome: Adequate for Discharge   Problem: Health Behavior/Discharge Planning: Goal: Ability to manage health-related needs will improve Outcome: Adequate for Discharge   Problem: Clinical Measurements: Goal: Ability to maintain clinical measurements within normal limits will improve Outcome: Adequate for Discharge Goal: Will remain free from infection Outcome: Adequate for Discharge Goal: Diagnostic test results will improve Outcome: Adequate for Discharge Goal: Respiratory complications will improve Outcome: Adequate for Discharge Goal: Cardiovascular complication will be avoided Outcome: Adequate for Discharge   Problem: Activity: Goal: Risk for activity intolerance will decrease Outcome: Adequate for Discharge   Problem: Nutrition: Goal: Adequate nutrition will be maintained Outcome: Adequate for Discharge   Problem: Coping: Goal: Level of anxiety will decrease Outcome: Adequate for Discharge   Problem: Elimination: Goal: Will not experience complications related to bowel motility Outcome: Adequate for Discharge Goal: Will not experience complications related to urinary retention Outcome: Adequate for Discharge   Problem: Pain Managment: Goal: General experience of comfort will improve Outcome: Adequate for Discharge   Problem: Safety: Goal: Ability to remain free from injury will improve Outcome: Adequate for Discharge   Problem: Skin Integrity: Goal: Risk for impaired skin integrity will decrease Outcome: Adequate for Discharge   Problem: Fluid Volume: Goal: Hemodynamic stability will improve Outcome: Adequate for Discharge   Problem: Clinical Measurements: Goal: Diagnostic  test results will improve Outcome: Adequate for Discharge Goal: Signs and symptoms of infection will decrease Outcome: Adequate for Discharge   Problem: Respiratory: Goal: Ability to maintain adequate ventilation will improve Outcome: Adequate for Discharge   Problem: Acute Rehab PT Goals(only PT should resolve) Goal: Pt Will Ambulate Outcome: Adequate for Discharge Goal: Pt Will Go Up/Down Stairs Outcome: Adequate for Discharge   Problem: Acute Rehab OT Goals (only OT should resolve) Goal: Pt/Caregiver Will Perform Home Exercise Program Outcome: Adequate for Discharge Goal: OT Additional ADL Goal #1 Outcome: Adequate for Discharge

## 2019-10-03 DIAGNOSIS — Z8616 Personal history of COVID-19: Secondary | ICD-10-CM

## 2019-10-03 LAB — CULTURE, BLOOD (ROUTINE X 2)
Culture: NO GROWTH
Culture: NO GROWTH
Special Requests: ADEQUATE
Special Requests: ADEQUATE

## 2019-10-05 MED ORDER — ACETAMINOPHEN 325 MG PO TABS
650.00 | ORAL_TABLET | ORAL | Status: DC
Start: ? — End: 2019-10-05

## 2019-10-05 MED ORDER — IPRATROPIUM BROMIDE 0.02 % IN SOLN
0.50 | RESPIRATORY_TRACT | Status: DC
Start: ? — End: 2019-10-05

## 2019-10-05 MED ORDER — GENERIC EXTERNAL MEDICATION
150.00 | Status: DC
Start: 2019-10-05 — End: 2019-10-05

## 2019-10-05 MED ORDER — MULTI-VITAMIN PO TABS
1.00 | ORAL_TABLET | ORAL | Status: DC
Start: 2019-10-06 — End: 2019-10-05

## 2019-10-05 MED ORDER — BUDESONIDE-FORMOTEROL FUMARATE 160-4.5 MCG/ACT IN AERO
2.00 | INHALATION_SPRAY | RESPIRATORY_TRACT | Status: DC
Start: 2019-10-05 — End: 2019-10-05

## 2019-10-05 MED ORDER — LIDOCAINE HCL 1 % IJ SOLN
0.50 | INTRAMUSCULAR | Status: DC
Start: ? — End: 2019-10-05

## 2019-10-05 MED ORDER — ENOXAPARIN SODIUM 40 MG/0.4ML ~~LOC~~ SOLN
40.00 | SUBCUTANEOUS | Status: DC
Start: 2019-10-06 — End: 2019-10-05

## 2019-10-05 MED ORDER — MELATONIN 3 MG PO TABS
3.00 | ORAL_TABLET | ORAL | Status: DC
Start: ? — End: 2019-10-05

## 2019-10-05 MED ORDER — FOLIC ACID 1 MG PO TABS
1.00 | ORAL_TABLET | ORAL | Status: DC
Start: 2019-10-06 — End: 2019-10-05

## 2019-10-05 MED ORDER — BUSPIRONE HCL 5 MG PO TABS
5.00 | ORAL_TABLET | ORAL | Status: DC
Start: 2019-10-05 — End: 2019-10-05

## 2019-10-05 MED ORDER — HYDROXYZINE HCL 10 MG PO TABS
10.00 | ORAL_TABLET | ORAL | Status: DC
Start: ? — End: 2019-10-05

## 2019-10-05 MED ORDER — POLYETHYLENE GLYCOL 3350 17 GM/SCOOP PO POWD
17.00 | ORAL | Status: DC
Start: ? — End: 2019-10-05

## 2019-10-06 LAB — FUNGITELL, SERUM: Fungitell Result: <31 pg/mL (ref <80)

## 2019-10-09 ENCOUNTER — Emergency Department: Payer: 59

## 2019-10-09 ENCOUNTER — Observation Stay
Admission: EM | Admit: 2019-10-09 | Discharge: 2019-10-10 | Disposition: A | Discharge status: Home / Self Care | Payer: 59 | Attending: Internal Medicine | Admitting: Internal Medicine

## 2019-10-09 ENCOUNTER — Other Ambulatory Visit: Payer: Self-pay

## 2019-10-09 ENCOUNTER — Encounter: Payer: Self-pay | Admitting: Emergency Medicine

## 2019-10-09 DIAGNOSIS — Z7951 Long term (current) use of inhaled steroids: Secondary | ICD-10-CM | POA: Insufficient documentation

## 2019-10-09 DIAGNOSIS — Z8616 Personal history of COVID-19: Secondary | ICD-10-CM

## 2019-10-09 DIAGNOSIS — B948 Sequelae of other specified infectious and parasitic diseases: Secondary | ICD-10-CM | POA: Diagnosis not present

## 2019-10-09 DIAGNOSIS — J9601 Acute respiratory failure with hypoxia: Secondary | ICD-10-CM | POA: Diagnosis not present

## 2019-10-09 DIAGNOSIS — J45909 Unspecified asthma, uncomplicated: Secondary | ICD-10-CM | POA: Insufficient documentation

## 2019-10-09 DIAGNOSIS — J40 Bronchitis, not specified as acute or chronic: Secondary | ICD-10-CM

## 2019-10-09 DIAGNOSIS — R0609 Other forms of dyspnea: Secondary | ICD-10-CM

## 2019-10-09 DIAGNOSIS — J209 Acute bronchitis, unspecified: Secondary | ICD-10-CM | POA: Diagnosis not present

## 2019-10-09 DIAGNOSIS — R0602 Shortness of breath: Secondary | ICD-10-CM | POA: Diagnosis present

## 2019-10-09 LAB — CBC WITH DIFFERENTIAL/PLATELET
Abs Immature Granulocytes: 0.04 10*3/uL (ref 0.00–0.07)
Basophils Absolute: 0.1 10*3/uL (ref 0.0–0.1)
Basophils Relative: 1 %
Eosinophils Absolute: 0.1 10*3/uL (ref 0.0–0.5)
Eosinophils Relative: 1 %
HCT: 41.4 % (ref 36.0–46.0)
Hemoglobin: 14 g/dL (ref 12.0–15.0)
Immature Granulocytes: 0 %
Lymphocytes Relative: 8 %
Lymphs Abs: 0.9 10*3/uL (ref 0.7–4.0)
MCH: 31 pg (ref 26.0–34.0)
MCHC: 33.8 g/dL (ref 30.0–36.0)
MCV: 91.6 fL (ref 80.0–100.0)
Monocytes Absolute: 0.7 10*3/uL (ref 0.1–1.0)
Monocytes Relative: 5 %
Neutro Abs: 10.2 10*3/uL — ABNORMAL HIGH (ref 1.7–7.7)
Neutrophils Relative %: 85 %
Platelets: 303 10*3/uL (ref 150–400)
RBC: 4.52 MIL/uL (ref 3.87–5.11)
RDW: 12 % (ref 11.5–15.5)
WBC: 12 10*3/uL — ABNORMAL HIGH (ref 4.0–10.5)
nRBC: 0 % (ref 0.0–0.2)

## 2019-10-09 MED ORDER — IPRATROPIUM-ALBUTEROL 0.5-2.5 (3) MG/3ML IN SOLN
3.0000 mL | Freq: Once | RESPIRATORY_TRACT | Status: AC
  Administered 2019-10-09: 3 mL via RESPIRATORY_TRACT
  Filled 2019-10-09: qty 3

## 2019-10-09 MED ORDER — LACTATED RINGERS IV BOLUS
1000.0000 mL | Freq: Once | INTRAVENOUS | Status: AC
  Administered 2019-10-10: 1000 mL via INTRAVENOUS

## 2019-10-09 MED ORDER — ACETAMINOPHEN 500 MG PO TABS
1000.0000 mg | ORAL_TABLET | Freq: Once | ORAL | Status: AC
  Administered 2019-10-09: 1000 mg via ORAL
  Filled 2019-10-09: qty 2

## 2019-10-09 NOTE — ED Triage Notes (Signed)
Pt to ED via EMS from home c/o SOB that worsened today.  States dx COVID last year and has been SOB since.  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  Pt presents A&Ox4, dry cough, chest pain and labored breathing.

## 2019-10-09 NOTE — ED Provider Notes (Signed)
[facility] Emergency Department Provider Note  ____________________________________________  Time seen: Approximately 11:45 PM  I have reviewed the triage vital signs and the nursing notes.   HISTORY  Chief Complaint Shortness of Breath   HPI Jordan [name] is a 34 y.o. female with a history of COVID-19 who presents for evaluation of shortness of breath.  Patient was diagnosed with COVID-19 on May 2020.  Since then she has been struggling with shortness of breath and tachycardia.  Was admitted recently in the beginning of the month.  Two days ago she saw her pulmonologist for worsening symptoms and was found to have worsening chest x-ray.  She was transition from Levaquin to Ceftin.  This evening she reports that her symptoms became even worse.  She was tachycardic with a pulse in the 190s and severely short of breath just by walking around the house.  She noticed that her sats were in the low 90s.  She has been using her inhalers 12-13 times a day.  She reports feeling warm to the touch but did not take her temperature.  She continues to have a cough which is productive of clear phlegm.  No hemoptysis, no prior history of PE or DVT, no leg pain or swelling.  No vomiting or diarrhea.  No chest pain.  She reports that her shortness of breath is mild to moderate at rest but becomes severe with minimal ambulation. She felt lightheaded earlier today.  She denies chest pain  Past Medical History:  Diagnosis Date   Blood transfusion without reported diagnosis    COVID-19 09/02/2018   Seizures [facility])     Patient Active Problem List   Diagnosis Date Noted   Asthma 10/10/2019   History of COVID-19 10/10/2019   Acute respiratory failure (HCC) 10/10/2019   Healthcare-associated pneumonia 09/29/2019   Sinus tachycardia 09/29/2019   Hypokalemia 09/29/2019   Sepsis (HCC) 09/29/2019   Women's annual routine gynecological examination 09/23/2019   Vaginal  discharge 09/23/2019    History reviewed. No pertinent surgical history.  Prior to Admission medications   Medication Sig Start Date End Date Taking? Authorizing Provider  albuterol (PROVENTIL) (2.5 MG/3ML) 0.083% nebulizer solution Take 3 mLs (2.5 mg total) by nebulization every 4 (four) hours as needed for wheezing or shortness of breath. 03/16/19   Irean Hong, MD  albuterol (VENTOLIN HFA) 108 (90 Base) MCG/ACT inhaler Inhale 2 puffs into the lungs every 6 (six) hours as needed for wheezing or shortness of breath. 08/31/18   Phineas Semen, MD  budesonide-formoterol [facility]) 160-4.5 MCG/ACT inhaler Inhale 2 puffs into the lungs 2 (two) times daily.    [provider]  Nebulizers (COMPRESSOR/NEBULIZER) MISC 1 Units by Does not apply route every 4 (four) hours as needed. Patient not taking: Reported on 09/23/2019 03/16/19   Irean Hong, MD    Allergies Patient has no known allergies.  Family History  Problem Relation Age of Onset   Healthy Mother    Migraines Mother    Healthy Father     Social History Social History   Tobacco Use   Smoking status: Never Smoker   Smokeless tobacco: Never Used  Building services engineer Use: Never used  Substance Use Topics   Alcohol use: No   Drug use: No    Review of Systems  Constitutional: Negative for fever. + Lightheadedness Eyes: Negative for visual changes. ENT: Negative for sore throat. Neck: No neck pain  Cardiovascular: Negative for chest pain. +  tachycardia Respiratory: + shortness of breath, cough Gastrointestinal: Negative for abdominal pain, vomiting or diarrhea. Genitourinary: Negative for dysuria. Musculoskeletal: Negative for back pain. Skin: Negative for rash. Neurological: Negative for headaches, weakness or numbness. Psych: No SI or HI  ____________________________________________   PHYSICAL EXAM:  VITAL SIGNS: ED Triage Vitals [10/09/19 2323]  Enc Vitals Group     BP (!) 130/91      Pulse Rate (!) 135     Resp (!) 40     Temp 100 F (37.8 C)     Temp Source Oral     SpO2 93 %     Weight 160 lb (72.6 kg)     Height 5\' 7"  (1.702 m)     Head Circumference      Peak Flow      Pain Score 10     Pain Loc      Pain Edu?      Excl. in GC?     Constitutional: Alert and oriented. Well appearing and in no apparent distress. HEENT:      Head: Normocephalic and atraumatic.         Eyes: Conjunctivae are normal. Sclera is non-icteric.       Mouth/Throat: Mucous membranes are moist.       Neck: Supple with no signs of meningismus. Cardiovascular: Tachycardic with regular rhythm.  No murmurs. No JVD. Respiratory: Tachypneic with sats between 90 to 93%, severely diminished air movement bilaterally with no crackles or wheezes. Gastrointestinal: Soft, non tender, and non distended with positive bowel sounds. No rebound or guarding. Musculoskeletal:  No edema, cyanosis, or erythema of extremities. Neurologic: Normal speech and language. Face is symmetric. Moving all extremities. No gross focal neurologic deficits are appreciated. Skin: Skin is warm, dry and intact. No rash noted. Psychiatric: Mood and affect are normal. Speech and behavior are normal.  ____________________________________________   LABS (all labs ordered are listed, but only abnormal results are displayed)  Labs Reviewed  CBC WITH DIFFERENTIAL/PLATELET - Abnormal; Notable for the following components:      Result Value   WBC 12.0 (*)    Neutro Abs 10.2 (*)    All other components within normal limits  COMPREHENSIVE METABOLIC PANEL - Abnormal; Notable for the following components:   Glucose, Bld 111 (*)    All other components within normal limits  RESPIRATORY PANEL BY RT PCR (FLU A&B, COVID)  HCG, QUANTITATIVE, PREGNANCY  BRAIN NATRIURETIC PEPTIDE  PROCALCITONIN  TROPONIN I (HIGH SENSITIVITY)  TROPONIN I (HIGH SENSITIVITY)   ____________________________________________  EKG  ED ECG REPORT I,  , the attending physician, personally viewed and interpreted this ECG.  Sinus tachycardia, rate of 130, normal intervals, normal axis, no ST elevations or depressions.  Unchanged from prior. ____________________________________________  RADIOLOGY  I have personally reviewed the images performed during this visit and I agree with the Radiologist's read.   Interpretation by Radiologist:  DG Chest Portable 1 View  Result Date: 10/09/2019 CLINICAL DATA:  33 year old female with progressive shortness of breath today. Status post COVID-19. Last year. EXAM: PORTABLE CHEST 1 VIEW COMPARISON:  Portable chest 10/01/2019 and earlier. FINDINGS: Portable AP upright view at 2339 hours. Continued low lung volumes, lower since 10/01/2019. Coarse and patchy bibasilar pulmonary opacity appears unchanged from chest radiographs and CTA and may compatible with atelectasis or scarring. Elsewhere lungs appear clear. No pneumothorax. Normal cardiac size and mediastinal contours. Visualized tracheal air column is within normal limits. Mild scoliosis. No acute osseous abnormality identified. Negative  visible bowel gas pattern. IMPRESSION: Lower lung volumes with continued confluent basilar atelectasis or scarring. No new cardiopulmonary abnormality. Electronically Signed   By: Odessa Fleming M.D.   On: 10/09/2019 23:48      ____________________________________________   PROCEDURES  Procedure(s) performed:yes .1-3 Lead EKG Interpretation Performed by: Nita Sickle, MD Authorized by: Nita Sickle, MD     Interpretation: abnormal     ECG rate assessment: tachycardic     Rhythm: sinus bradycardia     Ectopy: none     Conduction: normal     Critical Care performed: yes  CRITICAL CARE Performed by: Nita Sickle  ?  Total critical care time: 40 min  Critical care time was exclusive of separately billable procedures and treating other patients.  Critical care was necessary to  treat or prevent imminent or life-threatening deterioration.  Critical care was time spent personally by me on the following activities: development of treatment plan with patient and/or surrogate as well as nursing, discussions with consultants, evaluation of patient's response to treatment, examination of patient, obtaining history from patient or surrogate, ordering and performing treatments and interventions, ordering and review of laboratory studies, ordering and review of radiographic studies, pulse oximetry and re-evaluation of patient's condition.  ____________________________________________   INITIAL IMPRESSION / ASSESSMENT AND PLAN / ED COURSE   34 y.o. female with a history of COVID-19 who presents for evaluation of worsening shortness of breath and tachycardia.  Currently on Ceftin after being transitioned from Levaquin 2 days ago for worsening chest x-ray.  Patient is in mild to moderate respiratory distress, tachypneic, satting 90 to 93% severely diminished air movement bilaterally.  Also tachycardic with a pulse of 135 and a low-grade temp of 100F.   Differential diagnoses including Covid versus flu versus pneumonia versus pericarditis versus myocarditis versus PE versus pericardial effusion.  Clinically less likely PE in the setting of persistent symptoms for several months with a negative a CT angio done 11 days ago.  Old medical records reviewed.  Patient placed on telemetry for close monitoring.  EKG shows sinus tachycardia unchanged from baseline.  We will get a chest x-ray, procalcitonin, troponin and BNP, chemistry panel, CBC.  Will treat patient with duo nebs, Tylenol, IV fluids.  _________________________ 2:54 AM on 10/10/2019 -----------------------------------------  Chest x-ray visualized by me and unchanged from prior, confirmed by radiology.  Patient has mild leukocytosis with white count of 12 with a left shift but normal procalcitonin.  High-sensitivity troponin  x2 - and BNP negative therefore very low suspicion for myocarditis or pericarditis.  After receiving 3 duo nebs patient still pretty tachypneic with respiratory rate in the mid 40s and sats dropping to the upper 80s with minimal ambulation.  She is satting 95% at rest.  Her pulse also has improved in the low 100s however just with sitting up in bed her pulse goes up to the 130s.  Therefore will discuss with hospitalist for admission.    _____________________________________________ Please note:  Patient was evaluated in Emergency Department today for the symptoms described in the history of present illness. Patient was evaluated in the context of the global COVID-19 pandemic, which necessitated consideration that the patient might be at risk for infection with the SARS-CoV-2 virus that causes COVID-19. Institutional protocols and algorithms that pertain to the evaluation of patients at risk for COVID-19 are in a state of rapid change based on information released by regulatory bodies including the CDC and federal and state organizations. These policies and algorithms were followed  during the patient's care in the ED.  Some ED evaluations and interventions may be delayed as a result of limited staffing during the pandemic.   [place] Controlled Substance Database was reviewed by me. ____________________________________________   FINAL CLINICAL IMPRESSION(S) / ED DIAGNOSES   Final diagnoses:  Acute respiratory failure with hypoxia ([place])      NEW MEDICATIONS STARTED DURING THIS VISIT:  ED Discharge Orders    None       Note:  This document was prepared using Dragon voice recognition software and may include unintentional dictation errors.    Alfred Levins, [place], MD 10/10/19 [phone]

## 2019-10-10 DIAGNOSIS — J40 Bronchitis, not specified as acute or chronic: Secondary | ICD-10-CM | POA: Diagnosis not present

## 2019-10-10 DIAGNOSIS — J9601 Acute respiratory failure with hypoxia: Secondary | ICD-10-CM | POA: Diagnosis not present

## 2019-10-10 DIAGNOSIS — R0609 Other forms of dyspnea: Secondary | ICD-10-CM | POA: Diagnosis not present

## 2019-10-10 DIAGNOSIS — Z8616 Personal history of COVID-19: Secondary | ICD-10-CM | POA: Diagnosis not present

## 2019-10-10 LAB — COMPREHENSIVE METABOLIC PANEL
ALT: 15 U/L (ref 0–44)
AST: 21 U/L (ref 15–41)
Albumin: 4 g/dL (ref 3.5–5.0)
Alkaline Phosphatase: 49 U/L (ref 38–126)
Anion gap: 11 (ref 5–15)
BUN: 7 mg/dL (ref 6–20)
CO2: 24 mmol/L (ref 22–32)
Calcium: 9.3 mg/dL (ref 8.9–10.3)
Chloride: 102 mmol/L (ref 98–111)
Creatinine, Ser: 0.94 mg/dL (ref 0.44–1.00)
GFR calc Af Amer: >60 mL/min (ref >60)
GFR calc non Af Amer: >60 mL/min (ref >60)
Glucose, Bld: 111 mg/dL — ABNORMAL HIGH (ref 70–99)
Potassium: 3.8 mmol/L (ref 3.5–5.1)
Sodium: 137 mmol/L (ref 135–145)
Total Bilirubin: 1.1 mg/dL (ref 0.3–1.2)
Total Protein: 8.1 g/dL (ref 6.5–8.1)

## 2019-10-10 LAB — TROPONIN I (HIGH SENSITIVITY)
Troponin I (High Sensitivity): 4 ng/L (ref <18)
Troponin I (High Sensitivity): 3 ng/L (ref <18)

## 2019-10-10 LAB — BRAIN NATRIURETIC PEPTIDE: B Natriuretic Peptide: 27.5 pg/mL (ref 0.0–100.0)

## 2019-10-10 LAB — HCG, QUANTITATIVE, PREGNANCY: hCG, Beta Chain, Quant, S: <1 m[IU]/mL (ref <5)

## 2019-10-10 LAB — PROCALCITONIN: Procalcitonin: <0.1 ng/mL

## 2019-10-10 LAB — RESPIRATORY PANEL BY RT PCR (FLU A&B, COVID)
Influenza A by PCR: NEGATIVE
Influenza B by PCR: NEGATIVE
SARS Coronavirus 2 by RT PCR: NEGATIVE

## 2019-10-10 MED ORDER — IPRATROPIUM BROMIDE 0.02 % IN SOLN
0.5000 mg | Freq: Four times a day (QID) | RESPIRATORY_TRACT | Status: DC
  Administered 2019-10-10: 0.5 mg via RESPIRATORY_TRACT
  Filled 2019-10-10 (×3): qty 2.5

## 2019-10-10 MED ORDER — ALBUTEROL SULFATE (2.5 MG/3ML) 0.083% IN NEBU: 2.5000 mg | INHALATION_SOLUTION | RESPIRATORY_TRACT | Status: DC | PRN

## 2019-10-10 MED ORDER — PREDNISONE 10 MG (21) PO TBPK
ORAL_TABLET | ORAL | 0 refills | Status: DC
Start: 2019-10-10 — End: 2020-04-14

## 2019-10-10 MED ORDER — ENOXAPARIN SODIUM 40 MG/0.4ML ~~LOC~~ SOLN: 40.0000 mg | SUBCUTANEOUS | Status: DC

## 2019-10-10 MED ORDER — PREDNISONE 20 MG PO TABS
40.0000 mg | ORAL_TABLET | Freq: Every day | ORAL | Status: DC
  Administered 2019-10-10: 40 mg via ORAL
  Filled 2019-10-10: qty 2

## 2019-10-10 NOTE — ED Notes (Signed)
RN moved patient to hallway until patient significant other arrives due to patient only having hospital gown and shorts. Patient feels more comfortable in gown than paper scrubs.

## 2019-10-10 NOTE — ED Notes (Signed)
RN called and spoke to [employer] who was assisting another patient and will call RN back once complete.

## 2019-10-10 NOTE — H&P (Signed)
History and Physical    Jordan [name] WUJ:[id number] DOB: 01/05/1986 DOA: 10/09/2019  PCP: Marya Fossa, PA-C   Patient coming from: Home  I have personally briefly reviewed patient's old medical records in [facility]  Chief Complaint: Shortness of breath  HPI: Jordan [name] is a 34 y.o. female with medical history significant for COVID-19 pneumonia in May 2020 with respiratory difficulty since, recently hospitalized with bilateral pneumonia, discharged on 10/02/2018, since followed up with pulmonologist, Dr. Meredeth Ide on 10/07/2018 with change in antibiotics from Levaquin to Ceftin due to lack of significant improvement was brought in by EMS with persistent shortness of breath and oxygen desaturations at home to the 80s.  She arrived on nonrebreather with sats 97%.  She has a chronic cough and has been chronically short of breath since her Covid diagnosis a year ago.  She was recently retested for Covid about 5 days ago and it was negative ED Course: On arrival to the ER she appeared very labored, tachycardic at 105 and tachypneic at 40.  She was afebrile and blood pressure normal at 123/86.  Blood work mostly unremarkable with a slightly elevated WBC of 12,000.  Procalcitonin less than 0.1.  Chest x-ray showed low lung volumes with continued confluent basilar atelectasis or scarring with no new cardiopulmonary abnormality.  Patient was given 3 DuoNeb treatments in the emergency room with some improvement but continued to desaturate with minimal exertion to the mid 80s.  Hospitalist consulted for admission  Review of Systems: As per HPI otherwise 10 point review of systems negative.    Past Medical History:  Diagnosis Date   Blood transfusion without reported diagnosis    COVID-19 09/02/2018   Seizures (HCC)     History reviewed. No pertinent surgical history.   reports that she has never smoked. She has never used smokeless tobacco. She reports that she does not drink  alcohol and does not use drugs.  No Known Allergies  Family History  Problem Relation Age of Onset   Healthy Mother    Migraines Mother    Healthy Father      Prior to Admission medications   Medication Sig Start Date End Date Taking? Authorizing Provider  albuterol (PROVENTIL) (2.5 MG/3ML) 0.083% nebulizer solution Take 3 mLs (2.5 mg total) by nebulization every 4 (four) hours as needed for wheezing or shortness of breath. 03/16/19   Irean Hong, MD  albuterol (VENTOLIN HFA) 108 (90 Base) MCG/ACT inhaler Inhale 2 puffs into the lungs every 6 (six) hours as needed for wheezing or shortness of breath. 08/31/18   Phineas Semen, MD  budesonide-formoterol [facility]) 160-4.5 MCG/ACT inhaler Inhale 2 puffs into the lungs 2 (two) times daily.    [provider]  Nebulizers (COMPRESSOR/NEBULIZER) MISC 1 Units by Does not apply route every 4 (four) hours as needed. Patient not taking: Reported on 09/23/2019 03/16/19   Irean Hong, MD    Physical Exam: Vitals:   10/09/19 2323 10/10/19 0202  BP: (!) 130/91 (!) 128/93  Pulse: (!) 135 (!) 110  Resp: (!) 40 (!) 34  Temp: 100 F (37.8 C) 98.8 F (37.1 C)  TempSrc: Oral Oral  SpO2: 93% 94%  Weight: 72.6 kg   Height: 5\' 7"  (1.702 m)      Vitals:   10/09/19 2323 10/10/19 0202  BP: (!) 130/91 (!) 128/93  Pulse: (!) 135 (!) 110  Resp: (!) 40 (!) 34  Temp: 100 F (37.8 C) 98.8 F (37.1 C)  TempSrc:  Oral Oral  SpO2: 93% 94%  Weight: 72.6 kg   Height: 5\' 7"  (1.702 m)       Constitutional: Alert and oriented x 3 . Mild conversational dyspnea HEENT:      Head: Normocephalic and atraumatic.         Eyes: PERLA, EOMI, Conjunctivae are normal. Sclera is non-icteric.       Mouth/Throat: Mucous membranes are moist.       Neck: Supple with no signs of meningismus. Cardiovascular: Regular but tachycardic. No murmurs, gallops, or rubs. 2+ symmetrical distal pulses are present . No JVD. No LE edema Respiratory: Respiratory  effort increased with mild tachypnea.Lungs sounds diminished bilaterally. No wheezes, crackles, or rhonchi.  Gastrointestinal: Soft, non tender, and non distended with positive bowel sounds. No rebound or guarding. Genitourinary: No CVA tenderness. Musculoskeletal: Nontender with normal range of motion in all extremities. No edema, cyanosis, or erythema of extremities. Neurologic: Normal speech and language. Face is symmetric. Moving all extremities. No gross focal neurologic deficits . Skin: Skin is warm, dry.  No rash or ulcers Psychiatric: Mood and affect are normal Speech and behavior are normal   Labs on Admission: I have personally reviewed following labs and imaging studies  CBC: Recent Labs  Lab 10/09/19 2328  WBC 12.0*  NEUTROABS 10.2*  HGB 14.0  HCT 41.4  MCV 91.6  PLT 409   Basic Metabolic Panel: Recent Labs  Lab 10/09/19 2328  NA 137  K 3.8  CL 102  CO2 24  GLUCOSE 111*  BUN 7  CREATININE 0.94  CALCIUM 9.3   GFR: Estimated Creatinine Clearance: 84.3 mL/min (by C-G formula based on SCr of 0.94 mg/dL). Liver Function Tests: Recent Labs  Lab 10/09/19 2328  AST 21  ALT 15  ALKPHOS 49  BILITOT 1.1  PROT 8.1  ALBUMIN 4.0   No results for input(s): LIPASE, AMYLASE in the last 168 hours. No results for input(s): AMMONIA in the last 168 hours. Coagulation Profile: No results for input(s): INR, PROTIME in the last 168 hours. Cardiac Enzymes: No results for input(s): CKTOTAL, CKMB, CKMBINDEX, TROPONINI in the last 168 hours. BNP (last 3 results) No results for input(s): PROBNP in the last 8760 hours. HbA1C: No results for input(s): HGBA1C in the last 72 hours. CBG: No results for input(s): GLUCAP in the last 168 hours. Lipid Profile: No results for input(s): CHOL, HDL, LDLCALC, TRIG, CHOLHDL, LDLDIRECT in the last 72 hours. Thyroid Function Tests: No results for input(s): TSH, T4TOTAL, FREET4, T3FREE, THYROIDAB in the last 72 hours. Anemia Panel: No  results for input(s): VITAMINB12, FOLATE, FERRITIN, TIBC, IRON, RETICCTPCT in the last 72 hours. Urine analysis:    Component Value Date/Time   COLORURINE YELLOW (A) 09/28/2019 2222   APPEARANCEUR CLOUDY (A) 09/28/2019 2222   LABSPEC 1.019 09/28/2019 2222   PHURINE 5.0 09/28/2019 2222   GLUCOSEU NEGATIVE 09/28/2019 2222   HGBUR NEGATIVE 09/28/2019 2222   BILIRUBINUR NEGATIVE 09/28/2019 2222   KETONESUR NEGATIVE 09/28/2019 2222   PROTEINUR NEGATIVE 09/28/2019 2222   UROBILINOGEN 1.0 06/12/2013 1359   NITRITE NEGATIVE 09/28/2019 2222   LEUKOCYTESUR NEGATIVE 09/28/2019 2222    Radiological Exams on Admission: DG Chest Portable 1 View  Result Date: 10/09/2019 CLINICAL DATA:  34 year old female with progressive shortness of breath today. Status post COVID-19. Last year. EXAM: PORTABLE CHEST 1 VIEW COMPARISON:  Portable chest 10/01/2019 and earlier. FINDINGS: Portable AP upright view at 2339 hours. Continued low lung volumes, lower since 10/01/2019. Coarse and patchy bibasilar pulmonary  opacity appears unchanged from chest radiographs and CTA and may compatible with atelectasis or scarring. Elsewhere lungs appear clear. No pneumothorax. Normal cardiac size and mediastinal contours. Visualized tracheal air column is within normal limits. Mild scoliosis. No acute osseous abnormality identified. Negative visible bowel gas pattern. IMPRESSION: Lower lung volumes with continued confluent basilar atelectasis or scarring. No new cardiopulmonary abnormality. Electronically Signed   By: Odessa Fleming M.D.   On: 10/09/2019 23:48    EKG: Independently reviewed.   Assessment/Plan Principal Problem:    Acute respiratory failure with hypoxia (HCC)   Bronchitis, acute   Chronic dyspnea post Covid May 2020 -Patient with chronic dyspnea since Covid in May 2020, recently hospitalized for pneumonia with persistent shortness of breath in spite of above antibiotic therapy -Suspect acute bronchitis, superimposed on  chronic dyspnea post Covid -Followed by pulmonologist, Dr. Meredeth Ide, last seen on 10/07/2019 -Scheduled and as needed bronchodilator treatments -Empiric steroids -No antibiotics for now.  Procalcitonin was less than 0.1.  Patient also completed outpatient Levaquin, though was switched to Ceftin as outpatient the day prior -Supplemental oxygen to keep sats over 92% -Consider ID consult    DVT prophylaxis: Lovenox  Code Status: full code  Family Communication:  none  Disposition Plan: Back to previous home environment Consults called: none  Status:obs    Andris Baumann MD [facility]     10/10/2019, 3:21 AM

## 2019-10-10 NOTE — ED Notes (Signed)
Pt ambulated from bed to computer with this RN with steady gait.  Patient O2 with good waveform down to 90% and then mid 80s%, pt became winded after 2 times from bed to computer and needed to sit down, heart rate increased at this time to 140bpm, MD notified.  Pt is and has been on room air.

## 2019-10-10 NOTE — Discharge Instructions (Signed)

## 2019-10-21 ENCOUNTER — Emergency Department: Payer: 59

## 2019-10-21 ENCOUNTER — Emergency Department
Admission: EM | Admit: 2019-10-21 | Discharge: 2019-10-21 | Disposition: A | Discharge status: Home / Self Care | Payer: 59 | Attending: Emergency Medicine | Admitting: Emergency Medicine

## 2019-10-21 ENCOUNTER — Encounter: Payer: Self-pay | Admitting: *Deleted

## 2019-10-21 ENCOUNTER — Other Ambulatory Visit: Payer: Self-pay

## 2019-10-21 DIAGNOSIS — Z8616 Personal history of COVID-19: Secondary | ICD-10-CM | POA: Diagnosis not present

## 2019-10-21 DIAGNOSIS — R000 Tachycardia, unspecified: Secondary | ICD-10-CM | POA: Insufficient documentation

## 2019-10-21 DIAGNOSIS — R0602 Shortness of breath: Secondary | ICD-10-CM

## 2019-10-21 DIAGNOSIS — Z79899 Other long term (current) drug therapy: Secondary | ICD-10-CM | POA: Diagnosis not present

## 2019-10-21 DIAGNOSIS — R079 Chest pain, unspecified: Secondary | ICD-10-CM

## 2019-10-21 LAB — BASIC METABOLIC PANEL
Anion gap: 10 (ref 5–15)
BUN: 9 mg/dL (ref 6–20)
CO2: 23 mmol/L (ref 22–32)
Calcium: 9.1 mg/dL (ref 8.9–10.3)
Chloride: 107 mmol/L (ref 98–111)
Creatinine, Ser: 0.91 mg/dL (ref 0.44–1.00)
GFR calc Af Amer: >60 mL/min (ref >60)
GFR calc non Af Amer: >60 mL/min (ref >60)
Glucose, Bld: 104 mg/dL — ABNORMAL HIGH (ref 70–99)
Potassium: 3.8 mmol/L (ref 3.5–5.1)
Sodium: 140 mmol/L (ref 135–145)

## 2019-10-21 LAB — CBC
HCT: 38.5 % (ref 36.0–46.0)
Hemoglobin: 12.8 g/dL (ref 12.0–15.0)
MCH: 30.9 pg (ref 26.0–34.0)
MCHC: 33.2 g/dL (ref 30.0–36.0)
MCV: 93 fL (ref 80.0–100.0)
Platelets: 271 10*3/uL (ref 150–400)
RBC: 4.14 MIL/uL (ref 3.87–5.11)
RDW: 12.3 % (ref 11.5–15.5)
WBC: 14.1 10*3/uL — ABNORMAL HIGH (ref 4.0–10.5)
nRBC: 0 % (ref 0.0–0.2)

## 2019-10-21 LAB — TROPONIN I (HIGH SENSITIVITY): Troponin I (High Sensitivity): 4 ng/L (ref <18)

## 2019-10-21 MED ORDER — LORAZEPAM 2 MG/ML IJ SOLN
0.5000 mg | Freq: Once | INTRAMUSCULAR | Status: AC
  Administered 2019-10-21: 0.5 mg via INTRAVENOUS
  Filled 2019-10-21: qty 1

## 2019-10-21 MED ORDER — SODIUM CHLORIDE 0.9 % IV BOLUS
1000.0000 mL | Freq: Once | INTRAVENOUS | Status: AC
  Administered 2019-10-21: 1000 mL via INTRAVENOUS

## 2019-10-21 MED ORDER — SODIUM CHLORIDE 0.9% FLUSH: 3.0000 mL | Freq: Once | INTRAVENOUS | Status: DC

## 2019-10-21 MED ORDER — ONDANSETRON 4 MG PO TBDP
ORAL_TABLET | ORAL | 0 refills | Status: DC
Start: 2019-10-21 — End: 2020-04-14

## 2019-10-21 NOTE — ED Notes (Signed)
ED Provider at bedside. 

## 2019-10-21 NOTE — Discharge Instructions (Signed)
As we discussed, your work-up tonight was generally reassuring.  Your heart rate is still beating too fast but this seems to be an ongoing issue since you developed lung issues after having COVID-19.  Since you are feeling better and have never had a drop in your oxygen level since coming to the emergency department, we agreed that it is reasonable for you to go home and follow-up with your specialists including her pulmonologist, cardiologist, and at the [facility] post Covid clinic as scheduled.  Please continue to use your prescribed medications.  Return to the emergency department if you develop new or worsening symptoms that concern you.

## 2019-10-21 NOTE — ED Triage Notes (Addendum)
Pt says she was lying in bed and started coughing, says this is normally was triggers her SOB. Reports her HR was in the 150's, normal HR lately has been around 120. Reports CP in the central chest for a couple of weeks, worse with coughing, sneezing, and moving around. Vomited yesterday. Saw Cardiology today for stress test, has had a heart monitor on for 2 weeks.

## 2019-10-21 NOTE — ED Provider Notes (Signed)
[facility] Emergency Department Provider Note  ____________________________________________   First MD Initiated Contact with Patient 10/21/19 [phone]     (approximate)  Jordan have reviewed the triage vital signs and the nursing notes.   HISTORY  Chief Complaint No chief complaint on file.    HPI Jordan [name] is a 34 y.o. female who has been suffering from chronic respiratory issues and multiple ED visits and admissions for acute respiratory failure, hypoxemia, and even fever after having a difficult course of COVID-19 in May 2020.  She has been in the hospital couple of times, has seen pulmonology, and even saw cardiology yesterday for evaluation of her ongoing issues with tachycardia.  Most recently she came to the emergency department less than 2 weeks ago with shortness of breath and was admitted but discharged a few hours later by the hospitalist service.  She presents tonight  for "one of what Jordan call my 'episodes'."  She says that she has these episodes sometimes where she begins to cough and then feels like she cannot catch her breath.  She admits that this makes her very anxious and start the panic which makes the breathing worse.  She states that her heart rate went up into the 160s and that her oxygen saturation dropped down to the mid 80s.  EMS was called and she was placed on 2 L and she was able to catch her breath and started to calm down.  Currently she is on room air and satting 97%.  She is still breathing rapidly and has tachycardia at a rate of about 130 but it is improved from prior.    She said that at the cardiologist office today they did not want to start her on medicine for her rapid heart rate because "it might affect my asthma" (she did not have asthma prior to COVID-19).  She is a non-smoker.  She is not having chest pain although she does have tightness associated with her "episodes".  She denies recent fever.  She denies vomiting, and  abdominal pain.  The episode was acute in onset and severe and she feels that being on oxygen by the ambulance helped make her better and helped her calm down.  She admits that anxiety is a significant component of this. She had some nausea but no vomiting and decreased oral intake due to not having an appetite.         Past Medical History:  Diagnosis Date  . Blood transfusion without reported diagnosis   . COVID-19 09/02/2018  . Seizures [facility])     Patient Active Problem List   Diagnosis Date Noted  . History of COVID-19 10/10/2019  . Chronic dyspnea 10/10/2019  . Acute respiratory failure with hypoxia (HCC) 10/10/2019  . Bronchitis 10/10/2019  . Healthcare-associated pneumonia 09/29/2019  . Sinus tachycardia 09/29/2019  . Hypokalemia 09/29/2019  . Sepsis (HCC) 09/29/2019  . Women's annual routine gynecological examination 09/23/2019  . Vaginal discharge 09/23/2019    History reviewed. No pertinent surgical history.  Prior to Admission medications   Medication Sig Start Date End Date Taking? Authorizing Provider  acetaminophen (TYLENOL) 500 MG tablet Take 1,000 mg by mouth every 8 (eight) hours as needed for moderate pain.    [provider]  albuterol (ACCUNEB) 1.25 MG/3ML nebulizer solution Take 1 ampule by nebulization every 4 (four) hours as needed for wheezing or shortness of breath.    [provider]  albuterol (VENTOLIN HFA) 108 (90 Base) MCG/ACT inhaler  Inhale 2 puffs into the lungs every 6 (six) hours as needed for wheezing or shortness of breath. 08/31/18   Phineas Semen, MD  budesonide-formoterol [facility]) 160-4.5 MCG/ACT inhaler Inhale 2 puffs into the lungs 2 (two) times daily.    [provider]  busPIRone (BUSPAR) 5 MG tablet Take 5 mg by mouth 2 (two) times daily. 10/06/19   [provider]  Elderberry 575 MG/5ML SYRP Take 30 mLs by mouth every morning.    [provider]  escitalopram (LEXAPRO) 10 MG tablet Take 10  mg by mouth daily. 10/06/19   [provider]  Nebulizers (COMPRESSOR/NEBULIZER) MISC 1 Units by Does not apply route every 4 (four) hours as needed. 03/16/19   Irean Hong, MD  ondansetron (ZOFRAN ODT) 4 MG disintegrating tablet Allow 1-2 tablets to dissolve in your mouth every 8 hours as needed for nausea/vomiting 10/21/19   Loleta Rose, MD  predniSONE (STERAPRED UNI-PAK 21 TAB) 10 MG (21) TBPK tablet Start 60 mg po daily, taper 10 mg daily until done 10/10/19   Delfino Lovett, MD    Allergies Patient has no known allergies.  Family History  Problem Relation Age of Onset  . Healthy Mother   . Migraines Mother   . Healthy Father     Social History Social History   Tobacco Use  . Smoking status: Never Smoker  . Smokeless tobacco: Never Used  Vaping Use  . Vaping Use: Never used  Substance Use Topics  . Alcohol use: No  . Drug use: No    Review of Systems Constitutional: No fever/chills Eyes: No visual changes. ENT: No sore throat. Cardiovascular: Denies chest pain.  Some tightness associated with her episodes. Respiratory: +shortness of breath. Gastrointestinal: No abdominal pain.  Nausea, no vomiting.  No diarrhea.  No constipation. Genitourinary: Negative for dysuria. Musculoskeletal: Negative for neck pain.  Negative for back pain. Integumentary: Negative for rash. Neurological: Negative for headaches, focal weakness or numbness.   ____________________________________________   PHYSICAL EXAM:  VITAL SIGNS: ED Triage Vitals  Enc Vitals Group     BP 10/21/19 0141 (!) 148/88     Pulse Rate 10/21/19 0141 (!) 130     Resp 10/21/19 0141 (!) 28     Temp 10/21/19 0141 99.1 F (37.3 C)     Temp Source 10/21/19 0141 Oral     SpO2 10/21/19 0141 95 %     Weight 10/21/19 0134 72.6 kg (160 lb)     Height 10/21/19 0134 1.702 m (5\' 7" )     Head Circumference --      Peak Flow --      Pain Score 10/21/19 0135 10     Pain Loc --      Pain Edu? --      Excl. in  GC? --     Constitutional: Alert and oriented.  Eyes: Conjunctivae are normal.  Head: Atraumatic. Nose: No congestion/rhinnorhea. Mouth/Throat: Patient is wearing a mask. Neck: No stridor.  No meningeal signs.   Cardiovascular: Tachycardia, regular rhythm. Good peripheral circulation. Grossly normal heart sounds. Respiratory: Increased respiratory rate but no retractions or accessory muscle usage.  Patient has some coarse breath sounds but no wheezing.  Room air oxygen saturation is 97%.  Patient is speaking in a clear voice. Gastrointestinal: Soft and nontender. No distention.  Musculoskeletal: No lower extremity tenderness nor edema. No gross deformities of extremities. Neurologic:  Normal speech and language. No gross focal neurologic deficits are appreciated.  Skin:  Skin is  warm, dry and intact. Psychiatric: Mood and affect are normal. Speech and behavior are normal.  ____________________________________________   LABS (all labs ordered are listed, but only abnormal results are displayed)  Labs Reviewed  BASIC METABOLIC PANEL - Abnormal; Notable for the following components:      Result Value   Glucose, Bld 104 (*)    All other components within normal limits  CBC - Abnormal; Notable for the following components:   WBC 14.1 (*)    All other components within normal limits  POC URINE PREG, ED  TROPONIN Jordan (HIGH SENSITIVITY)   ____________________________________________  EKG  ED ECG REPORT Jordan, Loleta Rose, the attending physician, personally viewed and interpreted this ECG.  Date: 10/21/2019 EKG Time: 1:40 AM Rate: 129 Rhythm: Sinus tachycardia QRS Axis: normal Intervals: normal ST/T Wave abnormalities: Inverted T waves in lead III, otherwise unremarkable Narrative Interpretation: no evidence of acute ischemia  ____________________________________________  RADIOLOGY Jordan, Loleta Rose, personally viewed and evaluated these images (plain radiographs) as part of my  medical decision making, as well as reviewing the written report by the radiologist.  ED MD interpretation: Streaky bibasilar opacities, likely scarring and/or atelectasis.  Official radiology report(s): DG Chest 1 View  Result Date: 10/21/2019 CLINICAL DATA:  Chest pain and shortness of breath EXAM: CHEST  1 VIEW COMPARISON:  10/09/2019 FINDINGS: Shallow lung inflation. Normal cardiomediastinal contours. There are streaky opacities at the right lung base, worsened from the prior study. No pleural effusion or pneumothorax. Mild left basilar opacity. IMPRESSION: Shallow lung inflation with bibasilar opacities, probably atelectasis or scarring. Electronically Signed   By: Deatra Robinson M.D.   On: 10/21/2019 02:13    ____________________________________________   PROCEDURES   Procedure(s) performed (including Critical Care):  .1-3 Lead EKG Interpretation Performed by: Loleta Rose, MD Authorized by: Loleta Rose, MD     Interpretation: abnormal     ECG rate:  128   ECG rate assessment: tachycardic     Rhythm: sinus tachycardia     Ectopy: none     Conduction: normal       ____________________________________________   INITIAL IMPRESSION / MDM / ASSESSMENT AND PLAN / ED COURSE  As part of my medical decision making, Jordan reviewed the following data within the electronic MEDICAL RECORD NUMBER History obtained from family, Nursing notes reviewed and incorporated, Labs reviewed , EKG interpreted , Old EKG reviewed, Old chart reviewed, Radiograph reviewed , Notes from prior ED visits and [place] Controlled Substance Database   Differential diagnosis includes, but is not limited to, post Covid respiratory complications, bronchiectasis, bronchitis, post Covid inflammation, acute Covid infection, anxiety, metabolic or electrolyte abnormality.  The patient is on the cardiac monitor to evaluate for evidence of arrhythmia and/or significant heart rate changes.  The patient's oxygen saturation is  in the upper 90s, also 100%.  She has tachypnea in the upper 20s but this seems to be baseline looking at her prior records.  Her heart rate is elevated tonight but she saw cardiology earlier today regarding her ongoing tachycardia.  Jordan had a long discussion with the patient and her mother.  We discussed acute and emergent conditions versus the ongoing chronic issues that she is undoubtedly suffering from her Covid infection.  We agreed to try a dose of Ativan 0.5 mg IV (she says she is very sensitive to medication) we will continue to monitor.  If this helps her feel more comfortable and slows her respirations and she continues to be stable even though she is  tachycardic, she may be appropriate for discharge to outpatient follow-up.  She has an appointment scheduled in 6 days at the post Covid clinic at [facility] which should help.  She has been suffering from some nausea and decreased oral intake so Jordan am also giving her a liter of fluids.  Jordan will reassess.     Clinical Course as of Oct 20 825  Wed Oct 21, 2019  0411 Patient still has sinus tachycardia between 130 and 140.  She is normal for her is about 125-130.  She is feeling better after the Ativan and has been resting.  Her heart rate remained fairly constant after the liter of fluids and I do not believe this is the result of dehydration.She and her mother and Jordan had another conversation about her heart rate and constellation of symptoms.  We all agree that this is not indicative of an acute medical issue.  Again, had Jordan not seen the results of her CTA chest from less than a month ago, Jordan would have already obtained a scan to rule out pulmonary embolism, but I do not think that a young woman such as herself would benefit from another CTA chest is a short period of time given the low risk of her symptoms being the result of a pulmonary embolus.  The patient has close follow-up in the form of a pulmonologist, cardiologist, and an appointment in 6 days at the  post [facility] clinic.  She is chest pain-free and not having any difficulty breathing at this time although she remains tachypneic.  Lung sounds are clear.  We discussed repeat imaging but the patient, her mother, and Jordan are comfortable with the plan for outpatient follow-up.  She will return to the emergency department with new or worsening symptoms.   [CF]    Clinical Course User Index [CF] Hinda Kehr, MD     ____________________________________________  FINAL CLINICAL IMPRESSION(S) / ED DIAGNOSES  Final diagnoses:  Shortness of breath  Sinus tachycardia     MEDICATIONS GIVEN DURING THIS VISIT:  Medications  sodium chloride flush (NS) 0.9 % injection 3 mL (has no administration in time range)  LORazepam (ATIVAN) injection 0.5 mg (0.5 mg Intravenous Given 10/21/19 0241)  sodium chloride 0.9 % bolus 1,000 mL (0 mLs Intravenous Stopped 10/21/19 0331)     ED Discharge Orders         Ordered    ondansetron (ZOFRAN ODT) 4 MG disintegrating tablet     Discontinue  Reprint     10/21/19 0427          *Please note:  Alain Marion was evaluated in Emergency Department on 10/21/2019 for the symptoms described in the history of present illness. She was evaluated in the context of the global COVID-19 pandemic, which necessitated consideration that the patient might be at risk for infection with the SARS-CoV-2 virus that causes COVID-19. Institutional protocols and algorithms that pertain to the evaluation of patients at risk for COVID-19 are in a state of rapid change based on information released by regulatory bodies including the CDC and federal and state organizations. These policies and algorithms were followed during the patient's care in the ED.  Some ED evaluations and interventions may be delayed as a result of limited staffing during and after the pandemic.*  Note:  This document was prepared using Dragon voice recognition software and may include unintentional dictation  errors.   Hinda Kehr, MD 10/21/19 [phone]

## 2019-10-21 NOTE — ED Triage Notes (Signed)
Pt arrives via [facility], from home, per report, the patient had COVID one year ago, and since then has been having issues with SOB, she is followed by Pulmonology for the same. Tonight, was lying down and felt SOB, used her inhaler without relief. Initial HR 160's and saturations mid 80%. Pt placed on 2 liters, HR improved to 130's, saturations in the 90's. She has been anxious en route. No EKG changes. Refused IV placement en route to ED

## 2019-10-23 ENCOUNTER — Emergency Department: Payer: 59

## 2019-10-23 ENCOUNTER — Encounter: Payer: Self-pay | Admitting: Psychiatry

## 2019-10-23 ENCOUNTER — Other Ambulatory Visit: Payer: Self-pay

## 2019-10-23 ENCOUNTER — Emergency Department
Admission: EM | Admit: 2019-10-23 | Discharge: 2019-10-23 | Disposition: A | Discharge status: Home / Self Care | Payer: 59 | Attending: Emergency Medicine | Admitting: Emergency Medicine

## 2019-10-23 DIAGNOSIS — R0602 Shortness of breath: Secondary | ICD-10-CM | POA: Diagnosis present

## 2019-10-23 DIAGNOSIS — R0600 Dyspnea, unspecified: Secondary | ICD-10-CM | POA: Diagnosis not present

## 2019-10-23 LAB — CBC WITH DIFFERENTIAL/PLATELET
Abs Immature Granulocytes: 0.03 10*3/uL (ref 0.00–0.07)
Basophils Absolute: 0 10*3/uL (ref 0.0–0.1)
Basophils Relative: 0 %
Eosinophils Absolute: 0.1 10*3/uL (ref 0.0–0.5)
Eosinophils Relative: 2 %
HCT: 38.5 % (ref 36.0–46.0)
Hemoglobin: 13 g/dL (ref 12.0–15.0)
Immature Granulocytes: 1 %
Lymphocytes Relative: 12 %
Lymphs Abs: 0.7 10*3/uL (ref 0.7–4.0)
MCH: 30.7 pg (ref 26.0–34.0)
MCHC: 33.8 g/dL (ref 30.0–36.0)
MCV: 91 fL (ref 80.0–100.0)
Monocytes Absolute: 0.4 10*3/uL (ref 0.1–1.0)
Monocytes Relative: 7 %
Neutro Abs: 4.4 10*3/uL (ref 1.7–7.7)
Neutrophils Relative %: 78 %
Platelets: 276 10*3/uL (ref 150–400)
RBC: 4.23 MIL/uL (ref 3.87–5.11)
RDW: 12.1 % (ref 11.5–15.5)
WBC: 5.7 10*3/uL (ref 4.0–10.5)
nRBC: 0 % (ref 0.0–0.2)

## 2019-10-23 LAB — BASIC METABOLIC PANEL
Anion gap: 11 (ref 5–15)
BUN: 6 mg/dL (ref 6–20)
CO2: 24 mmol/L (ref 22–32)
Calcium: 9 mg/dL (ref 8.9–10.3)
Chloride: 103 mmol/L (ref 98–111)
Creatinine, Ser: 0.87 mg/dL (ref 0.44–1.00)
GFR calc Af Amer: >60 mL/min (ref >60)
GFR calc non Af Amer: >60 mL/min (ref >60)
Glucose, Bld: 99 mg/dL (ref 70–99)
Potassium: 3.7 mmol/L (ref 3.5–5.1)
Sodium: 138 mmol/L (ref 135–145)

## 2019-10-23 LAB — TROPONIN I (HIGH SENSITIVITY): Troponin I (High Sensitivity): 3 ng/L (ref <18)

## 2019-10-23 MED ORDER — SODIUM CHLORIDE 0.9 % IV BOLUS
1000.0000 mL | Freq: Once | INTRAVENOUS | Status: AC
  Administered 2019-10-23: 1000 mL via INTRAVENOUS

## 2019-10-23 MED ORDER — LORAZEPAM 2 MG/ML IJ SOLN
0.5000 mg | Freq: Once | INTRAMUSCULAR | Status: AC
  Administered 2019-10-23: 0.5 mg via INTRAVENOUS
  Filled 2019-10-23: qty 1

## 2019-10-23 NOTE — ED Triage Notes (Signed)
Patient arrived from home via ems due to shortness of breath and coughing she has a hx of asthma due to previously having covid. Lungs clear Albuterol treatment given in EMS and arrived on 2 liter [place] NAD noted

## 2019-10-23 NOTE — ED Notes (Signed)
MD Siadecki did not want the second troponin drawn

## 2019-10-23 NOTE — ED Provider Notes (Signed)
[facility] Emergency Department Provider Note ____________________________________________   None    (approximate)  I have reviewed the triage vital signs and the nursing notes.   HISTORY  Chief Complaint Shortness of Breath    HPI Jordan [name] is a 34 y.o. female with PMH as noted below (briefly, COVID-19 last year with subsequent chronic respiratory issues and multiple ED visits and admissions) who presents with acute onset of shortness of breath this morning, associated with nonproductive cough, and making her feel like she cannot catch her breath.  The patient states that this is similar to prior episodes that she has had for which she has needed to come to the ED.  She reports associated nausea but no vomiting.  She reports some tightness in her chest but no significant pain.  She states that she took a BuSpar last night for anxiety, but states she was up most the night feeling anxious and short of breath.  The patient also reports decreased appetite and p.o. intake over the last few days due to the breathing, and states she feels dehydrated.  Past Medical History:  Diagnosis Date  . Blood transfusion without reported diagnosis   . COVID-19 09/02/2018  . Seizures [facility])     Patient Active Problem List   Diagnosis Date Noted  . History of COVID-19 10/10/2019  . Chronic dyspnea 10/10/2019  . Acute respiratory failure with hypoxia (HCC) 10/10/2019  . Bronchitis 10/10/2019  . Healthcare-associated pneumonia 09/29/2019  . Sinus tachycardia 09/29/2019  . Hypokalemia 09/29/2019  . Sepsis (HCC) 09/29/2019  . Women's annual routine gynecological examination 09/23/2019  . Vaginal discharge 09/23/2019    History reviewed. No pertinent surgical history.  Prior to Admission medications   Medication Sig Start Date End Date Taking? Authorizing Provider  acetaminophen (TYLENOL) 500 MG tablet Take 1,000 mg by mouth every 8 (eight) hours as needed for  moderate pain.    [provider]  albuterol (ACCUNEB) 1.25 MG/3ML nebulizer solution Take 1 ampule by nebulization every 4 (four) hours as needed for wheezing or shortness of breath.    [provider]  albuterol (VENTOLIN HFA) 108 (90 Base) MCG/ACT inhaler Inhale 2 puffs into the lungs every 6 (six) hours as needed for wheezing or shortness of breath. 08/31/18   Phineas Semen, MD  budesonide-formoterol [employer]) 160-4.5 MCG/ACT inhaler Inhale 2 puffs into the lungs 2 (two) times daily.    [provider]  busPIRone (BUSPAR) 5 MG tablet Take 5 mg by mouth 2 (two) times daily. 10/06/19   [provider]  Elderberry 575 MG/5ML SYRP Take 30 mLs by mouth every morning.    [provider]  escitalopram (LEXAPRO) 10 MG tablet Take 10 mg by mouth daily. 10/06/19   [provider]  Nebulizers (COMPRESSOR/NEBULIZER) MISC 1 Units by Does not apply route every 4 (four) hours as needed. 03/16/19   Irean Hong, MD  ondansetron (ZOFRAN ODT) 4 MG disintegrating tablet Allow 1-2 tablets to dissolve in your mouth every 8 hours as needed for nausea/vomiting 10/21/19   Loleta Rose, MD  predniSONE (STERAPRED UNI-PAK 21 TAB) 10 MG (21) TBPK tablet Start 60 mg po daily, taper 10 mg daily until done 10/10/19   Delfino Lovett, MD    Allergies Patient has no known allergies.  Family History  Problem Relation Age of Onset  . Healthy Mother   . Migraines Mother   . Healthy Father     Social History Social History   Tobacco  Use  . Smoking status: Never Smoker  . Smokeless tobacco: Never Used  Vaping Use  . Vaping Use: Never used  Substance Use Topics  . Alcohol use: No  . Drug use: No    Review of Systems  Constitutional: No fever. Eyes: No redness. ENT: No sore throat. Cardiovascular: Denies chest pain. Respiratory: Positive for shortness of breath. Gastrointestinal: No vomiting or diarrhea.  Genitourinary: Negative for dysuria.  Musculoskeletal:  Negative for back pain. Skin: Negative for rash. Neurological: Negative for headache.   ____________________________________________   PHYSICAL EXAM:  VITAL SIGNS: ED Triage Vitals  Enc Vitals Group     BP 10/23/19 0828 (!) 137/93     Pulse Rate 10/23/19 0828 (!) 106     Resp 10/23/19 0828 (!) 30     Temp 10/23/19 0828 98.5 F (36.9 C)     Temp Source 10/23/19 0828 Oral     SpO2 10/23/19 0828 94 %     Weight 10/23/19 0831 160 lb 0.9 oz (72.6 kg)     Height 10/23/19 0831 5\' 7"  (1.702 m)     Head Circumference --      Peak Flow --      Pain Score 10/23/19 0831 0     Pain Loc --      Pain Edu? --      Excl. in GC? --     Constitutional: Alert and oriented.  Anxious appearing but in no acute distress. Eyes: Conjunctivae are normal.  Head: Atraumatic. Nose: No congestion/rhinnorhea. Mouth/Throat: Mucous membranes are moist.  Oropharynx clear with no erythema or exudate. Neck: Normal range of motion.  Cardiovascular: Tachycardiac, regular rhythm. Grossly normal heart sounds.  Good peripheral circulation. Respiratory: Slightly increased respiratory effort and tachypnea.  No retractions. Lungs CTAB. Gastrointestinal: No distention.  Musculoskeletal: No lower extremity edema.  No calf or popliteal swelling or tenderness.  Extremities warm and well perfused.  Neurologic:  Normal speech and language. No gross focal neurologic deficits are appreciated.  Skin:  Skin is warm and dry. No rash noted. Psychiatric: Anxious appearing.  ____________________________________________   LABS (all labs ordered are listed, but only abnormal results are displayed)  Labs Reviewed  BASIC METABOLIC PANEL  CBC WITH DIFFERENTIAL/PLATELET  TROPONIN I (HIGH SENSITIVITY)  TROPONIN I (HIGH SENSITIVITY)   ____________________________________________  EKG  ED ECG REPORT I, 10/25/19, the attending physician, personally viewed and interpreted this ECG.  Date: 10/23/2019 EKG Time:  0825 Rate: 105 Rhythm: normal sinus rhythm QRS Axis: normal Intervals: normal ST/T Wave abnormalities: Nonspecific borderline T wave abnormalities Narrative Interpretation: Borderline abnormalities with no evidence of acute ischemia; no significant change when compared to EKG of 10/21/2019  ____________________________________________  RADIOLOGY  CXR: Stable patchy opacities bilaterally, possibly slightly worse on the left  ____________________________________________   PROCEDURES  Procedure(s) performed: No  Procedures  Critical Care performed: No ____________________________________________   INITIAL IMPRESSION / ASSESSMENT AND PLAN / ED COURSE  Pertinent labs & imaging results that were available during my care of the patient were reviewed by me and considered in my medical decision making (see chart for details).  34 year old female with PMH as noted above, primarily significant for COVID-19 in May 2020 with subsequent chronic recurrent respiratory issues and multiple ED visits and admissions presents with an episode of shortness of breath and cough this morning which she states is similar to prior episodes.  She states the symptoms are somewhat improved now, but she also endorses decreased p.o. intake and some generalized weakness.  I reviewed the past medical records in [place].  The patient was most recently here on 6/23 with a similar episode, although at that time she was significantly tachycardic.  She had a negative work-up and responded well to 0.5 mg of Ativan.  Previously she was seen in the ED on 6/11 and had a brief admission that day due to persistent tachycardia and shortness of breath.  Previously she was admitted at the beginning of the month with sepsis due to multifocal pneumonia.  She has follow-up arranged with a pulmonologist, cardiologist, and an appointment at the [facility] post Covid clinic.  On exam currently, the is only mildly tachycardic at around 105,  tachypneic to 30, with otherwise normal vital signs.  O2 saturation is in the mid 90s on room air.  She appears anxious but consolable.  Lungs are clear bilaterally.  The physical exam is otherwise unremarkable.  Presentation is most consistent with an episode of bronchospasm given the acuity of the shortness of breath, the cough, and the fact that it is resolving spontaneously.  I suspect that there is a strong anxiety component, which the patient endorses.  This appears to be related to the patient's ongoing respiratory issues.  I have a low suspicion for recurrent pneumonia, and there is no clinical evidence for DVT or PE especially given that the patient is less tachycardic that she has been on previous visits.  I also have a low suspicion for cardiac etiology.  We will obtain a chest x-ray, obtain basic labs and a troponin, give a fluid bolus, a low-dose of IV Ativan, and reassess.  ----------------------------------------- 12:20 PM on 10/23/2019 -----------------------------------------  X-ray shows stable patchy opacities bilaterally, noted to be possibly slightly worse on the left.  However, the work-up is otherwise very reassuring.  The troponin is negative.  The WBC count has decreased significantly in the last couple of days from 14 to 5.  The patient is afebrile with no active coughing and no signs or symptoms of recurrent or worsening pneumonia.  The patient was able to sleep for several hours, and during this time her pulse was consistently in the 80s with an O2 saturation in the high 90s on room air.  Now that she is awake, her resting pulse seems to be around 105 and the O2 saturation remains in the mid to high 90s.  She is slightly tachypneic to around 30, however she does not demonstrate any significant increased work of breathing, accessory muscle use, or any other signs of respiratory distress.  This appears to be her baseline.  The patient states she feels well and is comfortable  going home.  At this time, the presentation is consistent with an exacerbation of her chronic shortness of breath that she has been experiencing, with likely element of bronchospasm and anxiety.  There is no indication for further ED work-up or prolonged observation.  Patient is stable for discharge home.  I counseled her on the results of the work-up.  I gave her very thorough return precautions and she expressed understanding.  She has follow-up plan already with specialists. ____________________________________________   FINAL CLINICAL IMPRESSION(S) / ED DIAGNOSES  Final diagnoses:  Dyspnea, unspecified type      NEW MEDICATIONS STARTED DURING THIS VISIT:  New Prescriptions   No medications on file     Note:  This document was prepared using Dragon voice recognition software and may include unintentional dictation errors.    Arta Silence, MD 10/23/19 1222

## 2019-10-23 NOTE — ED Notes (Signed)
Patient discharged home with husband, patient received discharge papers. Patient has belongings. Patient appropriate and cooperative. Vital signs taken. NAD noted.

## 2019-10-23 NOTE — Discharge Instructions (Signed)
Continue taking all of your prescribed medications.  Follow-up with the specialists at [facility] and your other doctors as scheduled.  Return to the ER for new, worsening, or persistent severe shortness of breath, cough, fever, weakness, worsening anxiety, or any other new or worsening symptoms that concern you.

## 2019-11-12 ENCOUNTER — Other Ambulatory Visit
Admission: RE | Admit: 2019-11-12 | Discharge: 2019-11-12 | Disposition: A | Discharge status: Home / Self Care | Payer: 59 | Source: Ambulatory Visit | Attending: Specialist | Admitting: Specialist

## 2019-11-12 DIAGNOSIS — R0789 Other chest pain: Secondary | ICD-10-CM | POA: Insufficient documentation

## 2019-11-12 DIAGNOSIS — R0600 Dyspnea, unspecified: Secondary | ICD-10-CM | POA: Insufficient documentation

## 2019-11-12 LAB — FIBRIN DERIVATIVES D-DIMER (ARMC ONLY): Fibrin derivatives D-dimer (ARMC): 391.68 ng/mL (FEU) (ref 0.00–499.00)

## 2019-11-17 ENCOUNTER — Other Ambulatory Visit: Payer: Self-pay | Admitting: Internal Medicine

## 2019-11-17 DIAGNOSIS — N644 Mastodynia: Secondary | ICD-10-CM

## 2019-11-23 ENCOUNTER — Ambulatory Visit (INDEPENDENT_AMBULATORY_CARE_PROVIDER_SITE_OTHER): Payer: 59 | Admitting: Licensed Clinical Social Worker

## 2019-11-23 ENCOUNTER — Other Ambulatory Visit: Payer: Self-pay

## 2019-11-23 DIAGNOSIS — F4323 Adjustment disorder with mixed anxiety and depressed mood: Secondary | ICD-10-CM | POA: Diagnosis not present

## 2019-11-23 NOTE — Patient Instructions (Signed)
Caring for Your Mental Health Mental health is emotional, psychological, and social well-being. Mental health is just as important as physical health. In fact, mental and physical health are connected, and you need both to be healthy. Some signs of good mental health (well-being) include:  Being able to attend to tasks at home, school, or work.  Being able to manage stress and emotions.  Practicing self-care, which may include: ? A regular exercise pattern. ? A reasonably healthy diet. ? Supportive and trusting relationships. ? The ability to relax and calm yourself (self-calm).  Having pleasurable hobbies and activities to do.  Believing that you have meaning and purpose in your life.  Recovering and adjusting after facing challenges (resilience). You can take steps to build or strengthen these mentally healthy behaviors. There are resources and support to help you with this. Why is caring for mental health important? Caring for your mental health is a big part of staying healthy. Everyone has times when feelings, thoughts, or situations feel overwhelming. Mental health means having the skills to manage what feels overwhelming. If this sense of being overwhelmed persists, however, you might need some help. If you have some of the following signs, you may need to take better care of your mental health or seek help from a health care provider or mental health professional:  Problems with energy or focus.  Changes in eating habits.  Problems sleeping, such as sleeping too much or not enough.  Emotional distress, such as anger, sadness, depression, or anxiety.  Major changes in your relationships.  Losing interest in life or activities that you used to enjoy. If you have any of these symptoms on most days for 2 weeks or longer:  Talk with a close friend or family member about how you are feeling.  Contact your health care provider to discuss your symptoms.  Consider working with a  mental health professional. Your health care provider, family, or friends may be able to recommend a therapist. What can I do to promote emotional and mental health? Managing emotions  Learn to identify emotions and deal with them. Recognizing your emotions is the first step in learning to deal with them.  Practice ways to appropriately express feelings. Remember that you can control your feelings. They do not control you.  Practice stress management techniques, such as: ? Relaxation techniques, like breathing or muscle relaxation exercises. ? Exercise. Regular activity can lower your stress level. ? Changing what you can change and accepting what you cannot change.  Build up your resilience so that you can recover and adjust after big problems or challenges. Practice resilient behaviors and attitudes: ? Set and focus on long-term goals. ? Develop and maintain healthy, supportive relationships. ? Learn to accept change and make the best of the situation. ? Take care of yourself physically by eating a healthy diet, getting plenty of sleep, and exercising regularly. ? Develop self-awareness. Ask others to give feedback about how they see you. ? Practice mindfulness meditation to help you stay calm when dealing with daily challenges. ? Learn to respond to situations in healthy ways, rather than reacting with your emotions. ? Keep a positive attitude, and believe in yourself. Your view of yourself affects your mental health. ? Develop your listening and empathy skills. These will help you deal with difficult situations and communications.  Remember that emotions can be used as a good source of communication and are a great source of energy. Try to laugh and find humor in life.   Sleeping  Get the right amount and quality of sleep. Sleep has a big impact on physical and mental health. To improve your sleep: ? Go to bed and wake up around the same time every day. ? Limit screen time before  bedtime. This includes the use of your cell phone, TV, computer, and tablet. ? Keep your bedroom dark and cool. Activity   Exercise or do some physical activity regularly. This helps: ? Keep your body strong, especially during times of stress. ? Get rid of chemicals in your body (hormones) that build up when you are stressed. ? Build up your resilience. Eating and drinking   Eat a healthy diet that includes whole grains, vegetables, fresh fruits, and lean proteins. If you have questions about what foods are best for you, ask your health care provider.  Try not to turn to sweet, salty, or otherwise unhealthy foods when you are tired or unhappy. This can lead to unwanted weight gain and is not a healthy way to cope with emotions. Where to find more information You can find more information about how to care for your mental health from:  National Alliance on Mental Illness (NAMI): www.nami.org  National Institute of Mental Health: www.nimh.nih.gov  Centers for Disease Control and Prevention: www.cdc.gov/hrqol/wellbeing.htm Contact a health care provider if:  You lose interest in being with others or you do not want to leave the house.  You have a hard time completing your normal activities or you have less energy than normal.  You cannot stay focused or you have problems with memory.  You feel that your senses are heightened, and this makes you upset or concerned.  You feel nervous or have rapid mood changes.  You are sleeping or eating more or less than normal.  You question reality or you show odd behavior that disturbs you or others. Get help right away if:  You have thoughts about hurting yourself or others. If you ever feel like you may hurt yourself or others, or have thoughts about taking your own life, get help right away. You can go to your nearest emergency department or call:  Your local emergency services (911 in the [place]  A suicide crisis helpline, such as the  National Suicide Prevention Lifeline at 1-800-273-8255. This is open 24 hours a day. Summary  Mental health is not just the absence of mental illness. It involves understanding your emotions and behaviors, and taking steps to cope with them in a healthy way.  If you have symptoms of mental or emotional distress, get help from family, friends, a health care provider, or a mental health professional.  Practice good mental health behaviors such as stress management skills, self-calming skills, exercise, and healthy sleeping and eating. This information is not intended to replace advice given to you by your health care provider. Make sure you discuss any questions you have with your health care provider. Document Revised: 03/29/2017 Document Reviewed: 08/28/2016 Elsevier Patient Education  2020 Elsevier Inc.  Managing Anxiety, Adult After being diagnosed with an anxiety disorder, you may be relieved to know why you have felt or behaved a certain way. You may also feel overwhelmed about the treatment ahead and what it will mean for your life. With care and support, you can manage this condition and recover from it. How to manage lifestyle changes Managing stress and anxiety  Stress is your body's reaction to life changes and events, both good and bad. Most stress will last just a few hours, but   stress can be ongoing and can lead to more than just stress. Although stress can play a major role in anxiety, it is not the same as anxiety. Stress is usually caused by something external, such as a deadline, test, or competition. Stress normally passes after the triggering event has ended.  Anxiety is caused by something internal, such as imagining a terrible outcome or worrying that something will go wrong that will devastate you. Anxiety often does not go away even after the triggering event is over, and it can become long-term (chronic) worry. It is important to understand the differences between stress and  anxiety and to manage your stress effectively so that it does not lead to an anxious response. Talk with your health care provider or a counselor to learn more about reducing anxiety and stress. He or she may suggest tension reduction techniques, such as:  Music therapy. This can include creating or listening to music that you enjoy and that inspires you.  Mindfulness-based meditation. This involves being aware of your normal breaths while not trying to control your breathing. It can be done while sitting or walking.  Centering prayer. This involves focusing on a word, phrase, or sacred image that means something to you and brings you peace.  Deep breathing. To do this, expand your stomach and inhale slowly through your nose. Hold your breath for 3-5 seconds. Then exhale slowly, letting your stomach muscles relax.  Self-talk. This involves identifying thought patterns that lead to anxiety reactions and changing those patterns.  Muscle relaxation. This involves tensing muscles and then relaxing them. Choose a tension reduction technique that suits your lifestyle and personality. These techniques take time and practice. Set aside 5-15 minutes a day to do them. Therapists can offer counseling and training in these techniques. The training to help with anxiety may be covered by some insurance plans. Other things you can do to manage stress and anxiety include:  Keeping a stress/anxiety diary. This can help you learn what triggers your reaction and then learn ways to manage your response.  Thinking about how you react to certain situations. You may not be able to control everything, but you can control your response.  Making time for activities that help you relax and not feeling guilty about spending your time in this way.  Visual imagery and yoga can help you stay calm and relax.  Medicines Medicines can help ease symptoms. Medicines for anxiety include:  Anti-anxiety  drugs.  Antidepressants. Medicines are often used as a primary treatment for anxiety disorder. Medicines will be prescribed by a health care provider. When used together, medicines, psychotherapy, and tension reduction techniques may be the most effective treatment. Relationships Relationships can play a big part in helping you recover. Try to spend more time connecting with trusted friends and family members. Consider going to couples counseling, taking family education classes, or going to family therapy. Therapy can help you and others better understand your condition. How to recognize changes in your anxiety Everyone responds differently to treatment for anxiety. Recovery from anxiety happens when symptoms decrease and stop interfering with your daily activities at home or work. This may mean that you will start to:  Have better concentration and focus. Worry will interfere less in your daily thinking.  Sleep better.  Be less irritable.  Have more energy.  Have improved memory. It is important to recognize when your condition is getting worse. Contact your health care provider if your symptoms interfere with home or work   and you feel like your condition is not improving. Follow these instructions at home: Activity  Exercise. Most adults should do the following: ? Exercise for at least 150 minutes each week. The exercise should increase your heart rate and make you sweat (moderate-intensity exercise). ? Strengthening exercises at least twice a week.  Get the right amount and quality of sleep. Most adults need 7-9 hours of sleep each night. Lifestyle   Eat a healthy diet that includes plenty of vegetables, fruits, whole grains, low-fat dairy products, and lean protein. Do not eat a lot of foods that are high in solid fats, added sugars, or salt.  Make choices that simplify your life.  Do not use any products that contain nicotine or tobacco, such as cigarettes, e-cigarettes, and  chewing tobacco. If you need help quitting, ask your health care provider.  Avoid caffeine, alcohol, and certain over-the-counter cold medicines. These may make you feel worse. Ask your pharmacist which medicines to avoid. General instructions  Take over-the-counter and prescription medicines only as told by your health care provider.  Keep all follow-up visits as told by your health care provider. This is important. Where to find support You can get help and support from these sources:  Self-help groups.  Online and community organizations.  A trusted spiritual leader.  Couples counseling.  Family education classes.  Family therapy. Where to find more information You may find that joining a support group helps you deal with your anxiety. The following sources can help you locate counselors or support groups near you:  Mental Health America: www.mentalhealthamerica.net  Anxiety and Depression Association of America (ADAA): www.adaa.org  National Alliance on Mental Illness (NAMI): www.nami.org Contact a health care provider if you:  Have a hard time staying focused or finishing daily tasks.  Spend many hours a day feeling worried about everyday life.  Become exhausted by worry.  Start to have headaches, feel tense, or have nausea.  Urinate more than normal.  Have diarrhea. Get help right away if you have:  A racing heart and shortness of breath.  Thoughts of hurting yourself or others. If you ever feel like you may hurt yourself or others, or have thoughts about taking your own life, get help right away. You can go to your nearest emergency department or call:  Your local emergency services (911 in the [place]  A suicide crisis helpline, such as the National Suicide Prevention Lifeline at 1-800-273-8255. This is open 24 hours a day. Summary  Taking steps to learn and use tension reduction techniques can help calm you and help prevent triggering an anxiety  reaction.  When used together, medicines, psychotherapy, and tension reduction techniques may be the most effective treatment.  Family, friends, and partners can play a big part in helping you recover from an anxiety disorder. This information is not intended to replace advice given to you by your health care provider. Make sure you discuss any questions you have with your health care provider. Document Revised: 09/16/2018 Document Reviewed: 09/16/2018 Elsevier Patient Education  2020 Elsevier Inc.  
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
## 2020-01-08 ENCOUNTER — Ambulatory Visit: Payer: 59 | Admitting: Obstetrics & Gynecology

## 2020-01-25 ENCOUNTER — Ambulatory Visit: Payer: 59 | Admitting: Obstetrics & Gynecology

## 2020-01-28 ENCOUNTER — Other Ambulatory Visit: Payer: Self-pay | Admitting: Physician Assistant

## 2020-01-28 DIAGNOSIS — R102 Pelvic and perineal pain: Secondary | ICD-10-CM

## 2020-02-04 ENCOUNTER — Other Ambulatory Visit: Payer: Self-pay

## 2020-02-04 ENCOUNTER — Ambulatory Visit
Admission: RE | Admit: 2020-02-04 | Discharge: 2020-02-04 | Disposition: A | Discharge status: Home / Self Care | Payer: Self-pay | Source: Ambulatory Visit | Attending: Physician Assistant | Admitting: Physician Assistant

## 2020-02-04 DIAGNOSIS — R102 Pelvic and perineal pain: Secondary | ICD-10-CM | POA: Insufficient documentation

## 2020-02-08 ENCOUNTER — Ambulatory Visit: Payer: 59 | Admitting: Obstetrics & Gynecology

## 2020-02-26 ENCOUNTER — Ambulatory Visit: Payer: Self-pay | Admitting: Obstetrics & Gynecology

## 2020-03-23 ENCOUNTER — Other Ambulatory Visit: Payer: Self-pay | Admitting: Physician Assistant

## 2020-03-23 DIAGNOSIS — N83292 Other ovarian cyst, left side: Secondary | ICD-10-CM

## 2020-04-14 ENCOUNTER — Ambulatory Visit (INDEPENDENT_AMBULATORY_CARE_PROVIDER_SITE_OTHER): Payer: 59 | Admitting: Obstetrics and Gynecology

## 2020-04-14 ENCOUNTER — Encounter: Payer: Self-pay | Admitting: Obstetrics and Gynecology

## 2020-04-14 ENCOUNTER — Other Ambulatory Visit (HOSPITAL_COMMUNITY)
Admission: RE | Admit: 2020-04-14 | Discharge: 2020-04-14 | Disposition: A | Discharge status: Home / Self Care | Payer: 59 | Source: Ambulatory Visit | Attending: Obstetrics and Gynecology | Admitting: Obstetrics and Gynecology

## 2020-04-14 ENCOUNTER — Other Ambulatory Visit: Payer: Self-pay

## 2020-04-14 VITALS — BP 114/80 | Ht 66.0 in | Wt 157.0 lb

## 2020-04-14 DIAGNOSIS — N760 Acute vaginitis: Secondary | ICD-10-CM

## 2020-04-14 DIAGNOSIS — A630 Anogenital (venereal) warts: Secondary | ICD-10-CM

## 2020-04-14 DIAGNOSIS — R1031 Right lower quadrant pain: Secondary | ICD-10-CM | POA: Diagnosis not present

## 2020-04-14 DIAGNOSIS — N83202 Unspecified ovarian cyst, left side: Secondary | ICD-10-CM | POA: Insufficient documentation

## 2020-04-14 DIAGNOSIS — Z113 Encounter for screening for infections with a predominantly sexual mode of transmission: Secondary | ICD-10-CM

## 2020-04-14 DIAGNOSIS — B9689 Other specified bacterial agents as the cause of diseases classified elsewhere: Secondary | ICD-10-CM | POA: Diagnosis not present

## 2020-04-14 LAB — POCT WET PREP WITH KOH
Clue Cells Wet Prep HPF POC: POSITIVE
KOH Prep POC: POSITIVE — AB
Trichomonas, UA: NEGATIVE
Yeast Wet Prep HPF POC: NEGATIVE

## 2020-04-14 MED ORDER — FLUCONAZOLE 150 MG PO TABS: 150.0000 mg | ORAL_TABLET | Freq: Once | ORAL | 0 refills | Status: AC

## 2020-04-14 MED ORDER — METRONIDAZOLE 0.75 % VA GEL: 1.0000 | Freq: Every day | VAGINAL | 0 refills | Status: AC

## 2020-04-14 NOTE — Patient Instructions (Signed)
I value your feedback and entrusting us with your care. If you get a [facility] patient survey, I would appreciate you taking the time to let us know about your experience today. Thank you!  As of April 09, 2019, your lab results will be released to your MyChart immediately, before I even have a chance to see them. Please give me time to review them and contact you if there are any abnormalities. Thank you for your patience.  

## 2020-04-14 NOTE — Progress Notes (Signed)
Jordan Fossa, PA-C   Chief Complaint  Patient presents with  . Vaginal Discharge    No itchiness, irritation, or discharge   . Pelvic Pain    Entire area x couple of months  . Urinary Tract Infection    Frequency only, no burning x couple days    HPI:      Jordan [name] is a 34 y.o. G0P0000 whose LMP was Patient's last menstrual period was 03/27/2020 (exact date)., presents today for vaginal d/c without odor (pt lost sense of smell with covid) or irritation. Hx of BV. Takes probiotics, uses water, uses dryer sheets. Had BV 5/21 and treated with metrogel (pt prefers) and diflucan prn.   Also with intermittent RLQ pain for the past few months. Sx sharp and fleeting, no aggrav factors. No GI sx, LBP, fevers, no bladder sx except frequency and nocturia, but pt drinks a lot of caffeine. Had GYN u/s 10/21 with 3.1 cm left complex but probably benign left ovarian cyst. Has u/s f/u for 04/18/20. Would like full STD testing. Neg gon/chlam/trich 5/21; no new partners.   Has ext genital warts that she wants tx. Was supposed to come back with MMF but was then sick with covid.   She is sex active with husband, trying to do REI for infertility but has to hold off due to ovar cyst. Hasn't done any tx yet.   Last pap ASCUS/ neg HPV DNA 5/21  Past Medical History:  Diagnosis Date  . Blood transfusion without reported diagnosis   . COVID-19 09/02/2018  . Seizures (HCC)     History reviewed. No pertinent surgical history.  Family History  Problem Relation Age of Onset  . Healthy Mother   . Migraines Mother   . Healthy Father     Social History   Socioeconomic History  . Marital status: Married    Spouse name: Not on file  . Number of children: Not on file  . Years of education: Not on file  . Highest education level: Not on file  Occupational History  . Not on file  Tobacco Use  . Smoking status: Never Smoker  . Smokeless tobacco: Never Used  Vaping Use  . Vaping  Use: Never used  Substance and Sexual Activity  . Alcohol use: No  . Drug use: No  . Sexual activity: Yes    Birth control/protection: None  Other Topics Concern  . Not on file  Social History Narrative  . Not on file   Social Determinants of Health   Financial Resource Strain: Not on file  Food Insecurity: Not on file  Transportation Needs: Not on file  Physical Activity: Not on file  Stress: Not on file  Social Connections: Not on file  Intimate Partner Violence: Not on file    Outpatient Medications Prior to Visit  Medication Sig Dispense Refill  . acetaminophen (TYLENOL) 500 MG tablet Take 1,000 mg by mouth every 8 (eight) hours as needed for moderate pain.    Marland Kitchen albuterol (ACCUNEB) 1.25 MG/3ML nebulizer solution Take 1 ampule by nebulization every 4 (four) hours as needed for wheezing or shortness of breath.    Marland Kitchen albuterol (VENTOLIN HFA) 108 (90 Base) MCG/ACT inhaler Inhale 2 puffs into the lungs every 6 (six) hours as needed for wheezing or shortness of breath. 1 Inhaler 0  . budesonide-formoterol (SYMBICORT) 160-4.5 MCG/ACT inhaler Inhale 2 puffs into the lungs 2 (two) times daily.    . busPIRone (BUSPAR) 5 MG tablet  Take 5 mg by mouth 2 (two) times daily.    Lucila Maine 575 MG/5ML SYRP Take 30 mLs by mouth every morning.    . escitalopram (LEXAPRO) 10 MG tablet Take 10 mg by mouth daily.    . Nebulizers (COMPRESSOR/NEBULIZER) MISC 1 Units by Does not apply route every 4 (four) hours as needed. 1 each 0  . ondansetron (ZOFRAN ODT) 4 MG disintegrating tablet Allow 1-2 tablets to dissolve in your mouth every 8 hours as needed for nausea/vomiting 30 tablet 0  . predniSONE (STERAPRED UNI-PAK 21 TAB) 10 MG (21) TBPK tablet Start 60 mg po daily, taper 10 mg daily until done 21 tablet 0   No facility-administered medications prior to visit.      ROS:  Review of Systems  Constitutional: Negative for fever, malaise/fatigue and weight loss.  Gastrointestinal: Negative for  blood in stool, constipation, diarrhea, nausea and vomiting.  Genitourinary: Positive for frequency, pelvic pain and vaginal discharge. Negative for dyspareunia, dysuria, flank pain, hematuria, urgency, vaginal bleeding and vaginal pain.  Musculoskeletal: Negative for back pain.  Skin: Negative for itching and rash.  BREAST: No symptoms   OBJECTIVE:   Vitals:  BP 114/80   Ht 5\' 6"  (1.676 m)   Wt 157 lb (71.2 kg)   LMP 03/27/2020 (Exact Date)   BMI 25.34 kg/m   Physical Exam Vitals reviewed.  Constitutional:      Appearance: She is well-developed.  Pulmonary:     Effort: Pulmonary effort is normal.  Abdominal:     Palpations: Abdomen is soft.     Tenderness: There is abdominal tenderness in the right lower quadrant. There is no guarding or rebound.  Genitourinary:    General: Normal vulva.     Pubic Area: No rash.      Labia:        Right: Lesion present. No rash or tenderness.        Left: Lesion present. No rash or tenderness.      Vagina: Normal. No vaginal discharge, erythema or tenderness.     Cervix: Normal.     Uterus: Normal. Tender. Not enlarged.      Adnexa: Right adnexa normal and left adnexa normal.       Right: No mass or tenderness.         Left: No mass or tenderness.      Musculoskeletal:        General: Normal range of motion.     Cervical back: Normal range of motion.  Skin:    General: Skin is warm and dry.  Neurological:     General: No focal deficit present.     Mental Status: She is alert and oriented to person, place, and time.  Psychiatric:        Mood and Affect: Mood normal.        Behavior: Behavior normal.        Thought Content: Thought content normal.        Judgment: Judgment normal.   WARTS TREATED WITH PODOPHYLLIN, PT TOLERATED WELL  Results: Results for orders placed or performed in visit on 04/14/20 (from the past 24 hour(s))  POCT Wet Prep with KOH     Status: Abnormal   Collection Time: 04/14/20  5:04 PM  Result Value Ref  Range   Trichomonas, UA Negative    Clue Cells Wet Prep HPF POC pos    Epithelial Wet Prep HPF POC     Yeast Wet Prep HPF POC neg  Bacteria Wet Prep HPF POC     RBC Wet Prep HPF POC     WBC Wet Prep HPF POC     KOH Prep POC Positive (A) Negative     Assessment/Plan: BV (bacterial vaginosis) - Plan: metroNIDAZOLE (METROGEL) 0.75 % vaginal gel, fluconazole (DIFLUCAN) 150 MG tablet, POCT Wet Prep with KOH, pos sx and wet prep. Rx metrogel, diflucan prn. Line dry underwear/condoms. F/u prn.   Screening for STD (sexually transmitted disease) - Plan: Cervicovaginal ancillary only, HIV Antibody (routine testing w rflx), RPR, HSV 2 antibody, IgG, Hepatitis C antibody, Hepatitis C antibody, HSV 2 antibody, IgG, RPR, HIV Antibody (routine testing w rflx)  RLQ abdominal pain--tender on exam. Has GYN u/s 04/18/20. Rule out STDs.   Left ovarian cyst--has f/u next wk, neg exam LLQ  Genital warts--treated with podophyllin, wash in 4-6 hrs. F/u prn.    Meds ordered this encounter  Medications  . metroNIDAZOLE (METROGEL) 0.75 % vaginal gel    Sig: Place 1 Applicatorful vaginally at bedtime for 5 days.    Dispense:  50 g    Refill:  0    Order Specific Question:   Supervising Provider    Answer:   Nadara Mustard [id number]  . fluconazole (DIFLUCAN) 150 MG tablet    Sig: Take 1 tablet (150 mg total) by mouth once for 1 dose.    Dispense:  1 tablet    Refill:  0    Order Specific Question:   Supervising Provider    Answer:   Nadara Mustard [[id number]]      Return if symptoms worsen or fail to improve.  Myka Hitz B. Amerah Puleo, PA-C 04/14/2020 5:07 PM

## 2020-04-16 LAB — HIV ANTIBODY (ROUTINE TESTING W REFLEX): HIV Screen 4th Generation wRfx: NONREACTIVE

## 2020-04-16 LAB — RPR: RPR Ser Ql: NONREACTIVE

## 2020-04-16 LAB — HSV 2 ANTIBODY, IGG: HSV 2 IgG, Type Spec: <0.91 index (ref 0.00–0.90)

## 2020-04-16 LAB — HEPATITIS C ANTIBODY: Hep C Virus Ab: <0.1 s/co ratio (ref 0.0–0.9)

## 2020-04-18 ENCOUNTER — Other Ambulatory Visit: Payer: Self-pay

## 2020-04-18 ENCOUNTER — Ambulatory Visit
Admission: RE | Admit: 2020-04-18 | Discharge: 2020-04-18 | Disposition: A | Discharge status: Home / Self Care | Payer: 59 | Source: Ambulatory Visit | Attending: Physician Assistant | Admitting: Physician Assistant

## 2020-04-18 DIAGNOSIS — N83292 Other ovarian cyst, left side: Secondary | ICD-10-CM | POA: Insufficient documentation

## 2020-04-18 LAB — CERVICOVAGINAL ANCILLARY ONLY
Chlamydia: NEGATIVE
Comment: NEGATIVE
Comment: NEGATIVE
Comment: NORMAL
Neisseria Gonorrhea: NEGATIVE
Trichomonas: NEGATIVE

## 2020-04-20 ENCOUNTER — Emergency Department: Payer: 59

## 2020-04-20 ENCOUNTER — Other Ambulatory Visit: Payer: Self-pay

## 2020-04-20 DIAGNOSIS — Z8616 Personal history of COVID-19: Secondary | ICD-10-CM | POA: Diagnosis not present

## 2020-04-20 DIAGNOSIS — R569 Unspecified convulsions: Secondary | ICD-10-CM | POA: Diagnosis not present

## 2020-04-20 DIAGNOSIS — R1030 Lower abdominal pain, unspecified: Secondary | ICD-10-CM | POA: Diagnosis present

## 2020-04-20 DIAGNOSIS — N83202 Unspecified ovarian cyst, left side: Secondary | ICD-10-CM | POA: Insufficient documentation

## 2020-04-20 DIAGNOSIS — R519 Headache, unspecified: Secondary | ICD-10-CM | POA: Diagnosis not present

## 2020-04-20 LAB — CBC
HCT: 36.4 % (ref 36.0–46.0)
Hemoglobin: 12.2 g/dL (ref 12.0–15.0)
MCH: 30.8 pg (ref 26.0–34.0)
MCHC: 33.5 g/dL (ref 30.0–36.0)
MCV: 91.9 fL (ref 80.0–100.0)
Platelets: 232 10*3/uL (ref 150–400)
RBC: 3.96 MIL/uL (ref 3.87–5.11)
RDW: 12.2 % (ref 11.5–15.5)
WBC: 4.7 10*3/uL (ref 4.0–10.5)
nRBC: 0 % (ref 0.0–0.2)

## 2020-04-20 LAB — URINALYSIS, COMPLETE (UACMP) WITH MICROSCOPIC
Bacteria, UA: NONE SEEN
Bilirubin Urine: NEGATIVE
Glucose, UA: NEGATIVE mg/dL
Hgb urine dipstick: NEGATIVE
Ketones, ur: NEGATIVE mg/dL
Leukocytes,Ua: NEGATIVE
Nitrite: NEGATIVE
Protein, ur: NEGATIVE mg/dL
Specific Gravity, Urine: 1.016 (ref 1.005–1.030)
pH: 5 (ref 5.0–8.0)

## 2020-04-20 LAB — BASIC METABOLIC PANEL
Anion gap: 7 (ref 5–15)
BUN: 8 mg/dL (ref 6–20)
CO2: 25 mmol/L (ref 22–32)
Calcium: 9 mg/dL (ref 8.9–10.3)
Chloride: 104 mmol/L (ref 98–111)
Creatinine, Ser: 0.7 mg/dL (ref 0.44–1.00)
GFR, Estimated: >60 mL/min (ref >60)
Glucose, Bld: 78 mg/dL (ref 70–99)
Potassium: 3.3 mmol/L — ABNORMAL LOW (ref 3.5–5.1)
Sodium: 136 mmol/L (ref 135–145)

## 2020-04-20 LAB — POC URINE PREG, ED: Preg Test, Ur: NEGATIVE

## 2020-04-20 NOTE — ED Triage Notes (Signed)
Pt in via EMS from work with c/o shaking all over and lower abd pain that is shooting and intermittent. Pt will clutch lower abd intermittently. Pt has abd [place] on Monday to look at cysts. BP 160/105, HR 90-100, HR 100%, FSBS 105

## 2020-04-20 NOTE — ED Triage Notes (Addendum)
Reports near syncope while at work, "felt  Like I was going to have a seizure", reports lower abdominal pain when it occurred but has resolved now. Denies chest pain or SOB.   CBG 109 taken PTA by EMS.

## 2020-04-21 ENCOUNTER — Emergency Department
Admission: EM | Admit: 2020-04-21 | Discharge: 2020-04-21 | Disposition: A | Discharge status: Home / Self Care | Payer: 59 | Attending: Emergency Medicine | Admitting: Emergency Medicine

## 2020-04-21 ENCOUNTER — Emergency Department: Payer: 59

## 2020-04-21 DIAGNOSIS — N83202 Unspecified ovarian cyst, left side: Secondary | ICD-10-CM

## 2020-04-21 DIAGNOSIS — R519 Headache, unspecified: Secondary | ICD-10-CM

## 2020-04-21 NOTE — Discharge Instructions (Addendum)
Please return for any further symptoms.  Please call either Dr. Malvin Johns or Dr. Sherryll Burger the neurologist in town and arrange a follow-up appointment.  Tell them you were seen in the emergency room with a bad headache and shaking on one side that resolved.  Also follow-up with your regular doctor.  They might want to get an MRI of you.

## 2020-04-21 NOTE — ED Provider Notes (Signed)
[facility] Emergency Department Provider Note   ____________________________________________   Event Date/Time   First MD Initiated Contact with Patient 04/21/20 0103     (approximate)  I have reviewed the triage vital signs and the nursing notes.   HISTORY  Chief Complaint Near Syncope   HPI Jordan [name] is a 34 y.o. 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  She is not sure what happened afterwards she may have passed out she got shaky all over headache lasted for half an hour or less and then resolved completely.  She felt like she might have a seizure.  Indeed she had shaking on the right side of her body that was difficult to control.  She was awake for that and knows it happened.  She also had some lower abdominal pain.  Ultrasound was done today that showed decreased in size of a complex cysts cyst possibly due to partial rupture.  This may have explained the belly pain she had.         Past Medical History:  Diagnosis Date  . Blood transfusion without reported diagnosis   . COVID-19 09/02/2018  . Seizures [facility](HCC)     Patient Active Problem List   Diagnosis Date Noted  . Genital warts 04/14/2020  . Left ovarian cyst 04/14/2020  . History of COVID-19 10/10/2019  . Chronic dyspnea 10/10/2019  . Acute respiratory failure with hypoxia (HCC) 10/10/2019  . Bronchitis 10/10/2019  . Healthcare-associated pneumonia 09/29/2019  . Sinus tachycardia 09/29/2019  . Hypokalemia 09/29/2019  . Sepsis (HCC) 09/29/2019  . Women's annual routine gynecological examination 09/23/2019  . Vaginal discharge 09/23/2019    History reviewed. No pertinent surgical history.  Prior to Admission medications   Medication Sig Start Date End Date Taking? Authorizing Provider  acetaminophen (TYLENOL) 500 MG tablet Take 1,000 mg by  mouth every 8 (eight) hours as needed for moderate pain.    [provider]  albuterol (ACCUNEB) 1.25 MG/3ML nebulizer solution Take 1 ampule by nebulization every 4 (four) hours as needed for wheezing or shortness of breath.    [provider]  albuterol (VENTOLIN HFA) 108 (90 Base) MCG/ACT inhaler Inhale 2 puffs into the lungs every 6 (six) hours as needed for wheezing or shortness of breath. 08/31/18   Phineas SemenGoodman, Graydon, MD  budesonide-formoterol [facility](SYMBICORT) 160-4.5 MCG/ACT inhaler Inhale 2 puffs into the lungs 2 (two) times daily.    [provider]  busPIRone (BUSPAR) 5 MG tablet Take 5 mg by mouth 2 (two) times daily. 10/06/19   [provider]  Elderberry 575 MG/5ML SYRP Take 30 mLs by mouth every morning.    [provider]  escitalopram (LEXAPRO) 10 MG tablet Take 10 mg by mouth daily. 10/06/19   [provider]  Nebulizers (COMPRESSOR/NEBULIZER) MISC 1 Units by Does not apply route every 4 (four) hours as needed. 03/16/19   Irean HongSung, Jade J, MD    Allergies Patient has no known allergies.  Family History  Problem Relation Age of Onset  . Healthy Mother   . Migraines Mother   . Healthy Father     Social History Social History   Tobacco Use  . Smoking status: Never Smoker  . Smokeless tobacco: Never Used  Vaping Use  . Vaping Use: Never used  Substance Use Topics  . Alcohol use: No  .  Drug use: No    Review of Systems  Constitutional: No fever/chills Eyes: No visual changes. ENT: No sore throat. Cardiovascular: Denies chest pain. Respiratory: Denies shortness of breath. Gastrointestinal: No abdominal pain.  No nausea, no vomiting.  No diarrhea.  No constipation. Genitourinary: Negative for dysuria. Musculoskeletal: Negative for back pain. Skin: Negative for rash. Neurological: Negative for focal weakness   ____________________________________________   PHYSICAL EXAM:  VITAL SIGNS: ED Triage Vitals  Enc Vitals  Group     BP 04/20/20 1730 129/87     Pulse Rate 04/20/20 1730 83     Resp 04/20/20 1730 18     Temp 04/20/20 1730 99.4 F (37.4 C)     Temp Source 04/20/20 1730 Oral     SpO2 04/20/20 1730 99 %     Weight 04/20/20 1727 156 lb 8.4 oz (71 kg)     Height 04/20/20 1727 5\' 6"  (1.676 m)     Head Circumference --      Peak Flow --      Pain Score 04/20/20 1727 0     Pain Loc --      Pain Edu? --      Excl. in GC? --     Constitutional: Alert and oriented. Well appearing and in no acute distress. Eyes: Conjunctivae are normal. PERRL. EOMI. fundi appear normal Head: Atraumatic. Nose: No congestion/rhinnorhea. Mouth/Throat: Mucous membranes are moist.  Oropharynx non-erythematous. Neck: No stridor.   Cardiovascular: Normal rate, regular rhythm. Grossly normal heart sounds.  Good peripheral circulation. Respiratory: Normal respiratory effort.  No retractions. Lungs CTAB. Gastrointestinal: Soft and nontender. No distention. No abdominal bruits. Musculoskeletal: No lower extremity tenderness nor edema.   Neurologic:  Normal speech and language. No gross focal neurologic deficits are appreciated.  Cranial nerves II through XII are intact although visual fields were not checked.  Finger-nose and rapid alternating movements in the hands and heel-to-shin were all normal.  Patient does not report any numbness and motor strength is 5/5 throughout Skin:  Skin is warm, dry and intact. No rash noted.   ____________________________________________   LABS (all labs ordered are listed, but only abnormal results are displayed)  Labs Reviewed  BASIC METABOLIC PANEL - Abnormal; Notable for the following components:      Result Value   Potassium 3.3 (*)    All other components within normal limits  URINALYSIS, COMPLETE (UACMP) WITH MICROSCOPIC - Abnormal; Notable for the following components:   Color, Urine YELLOW (*)    APPearance HAZY (*)    All other components within normal limits  CBC  POC  URINE PREG, ED   ____________________________________________  EKG  EKG read interpreted by me shows normal sinus rhythm rate of 88 normal axis essentially normal EKG ____________________________________________  RADIOLOGY 04/22/20, personally viewed and evaluated these images (plain radiographs) as part of my medical decision making, as well as reviewing the written report by the radiologist.  ED MD interpretation: Ultrasound shows some decrease in size of the complex ovarian cyst previously seen.  Radiology thinks this may be due to partial rupture. CT of the head read by radiology and reviewed by me does not show any acute pathology  Official radiology report(s): CT Head Wo Contrast  Result Date: 04/21/2020 CLINICAL DATA:  Severe sudden headache. Cerebral hemorrhage suspected. EXAM: CT HEAD WITHOUT CONTRAST TECHNIQUE: Contiguous axial images were obtained from the base of the skull through the vertex without intravenous contrast. COMPARISON:  CT maxillofacial 07/04/2019 FINDINGS: Brain:  No acute infarct, hemorrhage, or mass lesion is present. Cavum septum pellucidum is noted. The ventricles are otherwise within normal limits and of normal size. No significant white matter lesions are present. The brainstem and cerebellum are within normal limits. Vascular: No hyperdense vessel or unexpected calcification. Skull: Calvarium is intact. No focal lytic or blastic lesions are present. No significant extracranial soft tissue lesion is present. Sinuses/Orbits: The paranasal sinuses and mastoid air cells are clear. The globes and orbits are within normal limits. IMPRESSION: Negative CT of the head. No acute or focal abnormality to explain the patient's acute symptoms. Electronically Signed   By: Marin Roberts M.D.   On: 04/21/2020 02:09   US PELVIC COMPLETE W TRANSVAGINAL AND TORSION R/O  Result Date: 04/20/2020 CLINICAL DATA:  Known complex left ovarian cyst, sudden onset pelvic  pain today at 4 p.m. EXAM: TRANSABDOMINAL AND TRANSVAGINAL ULTRASOUND OF PELVIS DOPPLER ULTRASOUND OF OVARIES TECHNIQUE: Both transabdominal and transvaginal ultrasound examinations of the pelvis were performed. Transabdominal technique was performed for global imaging of the pelvis including uterus, ovaries, adnexal regions, and pelvic cul-de-sac. It was necessary to proceed with endovaginal exam following the transabdominal exam to visualize the adnexal structures. Color and duplex Doppler ultrasound was utilized to evaluate blood flow to the ovaries. COMPARISON:  04/18/2020, 02/04/2020 FINDINGS: Uterus Measurements: 9.8 x 4.5 by 6.0 cm = volume: 139.2 mL. No fibroids or other mass visualized. Endometrium Thickness: 11 mm.  No focal abnormality visualized. Right ovary Measurements: 5.3 x 3.4 by 4.0 cm = volume: 36.5 mL. There are 2 simple cysts, measuring 2.8 x 2.9 x 1.6 cm and 1.9 x 2.0 x 1.2 cm. Left ovary Measurements: 5.3 x 3.8 by 5.9 cm = volume: 62.4 mL. The complex left ovarian cyst seen previously is again identified, measuring 3.5 x 2.4 x 3.6 cm. This demonstrates decreased size since the preceding exam and may reflect rupture. There is continued mural thickening and irregularity, with persistent internal debris. Pulsed Doppler evaluation of both ovaries demonstrates normal low-resistance arterial and venous waveforms. Other findings Trace pelvic free fluid. IMPRESSION: 1. Complex left ovarian cyst, decreased in size since prior exam 2 days ago. This may reflect incomplete interval rupture. Given persistence since 02/04/2020, nonemergent follow-up pelvic MRI and/or gynecologic referral is again recommended for further evaluation. 2. Simple right ovarian cyst. 3. No evidence of ovarian torsion. Electronically Signed   By: Sharlet Salina M.D.   On: 04/20/2020 21:16    ____________________________________________   PROCEDURES  Procedure(s) performed (including Critical  Care):  Procedures   ____________________________________________   INITIAL IMPRESSION / ASSESSMENT AND PLAN / ED COURSE  Discussed with patient the fact that her headache which was sudden onset was not the worst of her life and resolved very rapidly.  I am not sure if she may have had a partial seizure or partial complex seizure with the right sided shaking.  Patient reports her boss said she did pass out but was very briefly only a few seconds.  Patient says she was aware of what was going on and just really could not do much about it and had initially cramping of her right hand and then shaking.  I discussed with her the possibility of subarachnoid which I do not think is likely since the CT was negative and the headache resolved quickly and was not the worst of her life.  It is still possible though.  I discussed with patient the alternatives of spinal tap or CT with contrast patient does not  want to do either 1 of those right now.  Patient is aware of risks of ruptured aneurysms in the brain including death.  I also offered the patient an MRI of her brain to further evaluate any possible mechanism for what may have been a partial or partial complex seizure.  Patient's says she rather not do that right now wants to go home and sleep and she will follow up with her primary care doctor in the neurology doctors to get the MRI and also she will need an EEG.             ____________________________________________   FINAL CLINICAL IMPRESSION(S) / ED DIAGNOSES  Final diagnoses:  Left ovarian cyst  Nonintractable headache, unspecified chronicity pattern, unspecified headache type     ED Discharge Orders    None      *Please note:  MOESHA SARCHET was evaluated in Emergency Department on 04/21/2020 for the symptoms described in the history of present illness. She was evaluated in the context of the global COVID-19 pandemic, which necessitated consideration that the patient might be at  risk for infection with the SARS-CoV-2 virus that causes COVID-19. Institutional protocols and algorithms that pertain to the evaluation of patients at risk for COVID-19 are in a state of rapid change based on information released by regulatory bodies including the CDC and federal and state organizations. These policies and algorithms were followed during the patient's care in the ED.  Some ED evaluations and interventions may be delayed as a result of limited staffing during and the pandemic.*   Note:  This document was prepared using Dragon voice recognition software and may include unintentional dictation errors.    Arnaldo Natal, MD 04/21/20 [phone]

## 2020-04-21 NOTE — ED Notes (Signed)
Transported to CT at this time. 

## 2020-06-29 ENCOUNTER — Other Ambulatory Visit: Payer: Self-pay | Admitting: Obstetrics and Gynecology

## 2020-06-29 DIAGNOSIS — N760 Acute vaginitis: Secondary | ICD-10-CM

## 2020-08-26 DIAGNOSIS — F1290 Cannabis use, unspecified, uncomplicated: Secondary | ICD-10-CM | POA: Diagnosis not present

## 2020-08-26 DIAGNOSIS — I10 Essential (primary) hypertension: Secondary | ICD-10-CM | POA: Diagnosis not present

## 2020-08-26 DIAGNOSIS — F17210 Nicotine dependence, cigarettes, uncomplicated: Secondary | ICD-10-CM | POA: Diagnosis not present

## 2020-08-26 DIAGNOSIS — N971 Female infertility of tubal origin: Secondary | ICD-10-CM | POA: Diagnosis not present

## 2020-08-26 DIAGNOSIS — Z3169 Encounter for other general counseling and advice on procreation: Secondary | ICD-10-CM | POA: Diagnosis not present

## 2020-08-26 DIAGNOSIS — Z113 Encounter for screening for infections with a predominantly sexual mode of transmission: Secondary | ICD-10-CM | POA: Diagnosis not present

## 2020-09-05 ENCOUNTER — Encounter: Payer: Self-pay | Admitting: Emergency Medicine

## 2020-09-05 ENCOUNTER — Other Ambulatory Visit: Payer: Self-pay

## 2020-09-05 ENCOUNTER — Ambulatory Visit
Admission: EM | Admit: 2020-09-05 | Discharge: 2020-09-05 | Disposition: A | Discharge status: Home / Self Care | Payer: BC Managed Care – PPO | Attending: Emergency Medicine | Admitting: Emergency Medicine

## 2020-09-05 DIAGNOSIS — B001 Herpesviral vesicular dermatitis: Secondary | ICD-10-CM

## 2020-09-05 MED ORDER — VALACYCLOVIR HCL 1 G PO TABS: 2000.0000 mg | ORAL_TABLET | Freq: Two times a day (BID) | ORAL | 0 refills | Status: AC

## 2020-09-05 NOTE — ED Triage Notes (Signed)
Patient c/o dental surgery on Friday. She states this morning she woke up with her lips swelling.

## 2020-09-05 NOTE — ED Provider Notes (Signed)
[facility]    CSN: [id number] Arrival date & time: 09/05/20  1749      History   Chief Complaint Chief Complaint  Patient presents with  . Oral Swelling    HPI Jordan [name] is a 35 y.o. female.   Jordan [name] presents with complaints of swelling and blisters to her bottom and top lips. She had 7 teeth pulled three days ago. Yesterday she felt like her lips were scratched, sensation as well as dry, started using vaseline. This morning they were swollen and painful. Burning pain. Denies any previous similar. She does not have any concerns with her dental procedure. No further bleeding.     ROS per HPI, negative if not otherwise mentioned.      Past Medical History:  Diagnosis Date  . Blood transfusion without reported diagnosis   . COVID-19 09/02/2018  . Seizures [facility])     Patient Active Problem List   Diagnosis Date Noted  . Genital warts 04/14/2020  . Left ovarian cyst 04/14/2020  . History of COVID-19 10/10/2019  . Chronic dyspnea 10/10/2019  . Acute respiratory failure with hypoxia (HCC) 10/10/2019  . Bronchitis 10/10/2019  . Healthcare-associated pneumonia 09/29/2019  . Sinus tachycardia 09/29/2019  . Hypokalemia 09/29/2019  . Sepsis (HCC) 09/29/2019  . Women's annual routine gynecological examination 09/23/2019  . Vaginal discharge 09/23/2019    History reviewed. No pertinent surgical history.  OB History    Gravida  0   Para  0   Term  0   Preterm  0   AB  0   Living  0     SAB  0   IAB  0   Ectopic  0   Multiple  0   Live Births  0            Home Medications    Prior to Admission medications   Medication Sig Start Date End Date Taking? Authorizing Provider  acetaminophen (TYLENOL) 500 MG tablet Take 1,000 mg by mouth every 8 (eight) hours as needed for moderate pain.   Yes [provider]  albuterol (ACCUNEB) 1.25 MG/3ML nebulizer solution Take 1 ampule by nebulization every 4 (four) hours as  needed for wheezing or shortness of breath.   Yes [provider]  albuterol (VENTOLIN HFA) 108 (90 Base) MCG/ACT inhaler Inhale 2 puffs into the lungs every 6 (six) hours as needed for wheezing or shortness of breath. 08/31/18  Yes Phineas Semen, MD  budesonide-formoterol [facility]) 160-4.5 MCG/ACT inhaler Inhale 2 puffs into the lungs 2 (two) times daily.   Yes [provider]  busPIRone (BUSPAR) 5 MG tablet Take 5 mg by mouth 2 (two) times daily. 10/06/19  Yes [provider]  Elderberry 575 MG/5ML SYRP Take 30 mLs by mouth every morning.   Yes [provider]  escitalopram (LEXAPRO) 10 MG tablet Take 10 mg by mouth daily. 10/06/19  Yes [provider]  Nebulizers (COMPRESSOR/NEBULIZER) MISC 1 Units by Does not apply route every 4 (four) hours as needed. 03/16/19  Yes Irean Hong, MD  valACYclovir (VALTREX) 1000 MG tablet Take 2 tablets (2,000 mg total) by mouth 2 (two) times daily for 1 day. 09/05/20 09/06/20 Yes Georgetta Haber, NP    Family History Family History  Problem Relation Age of Onset  . Healthy Mother   . Migraines Mother   . Healthy Father     Social History Social History   Tobacco Use  . Smoking status:  Never Smoker  . Smokeless tobacco: Never Used  Vaping Use  . Vaping Use: Never used  Substance Use Topics  . Alcohol use: No  . Drug use: No     Allergies   Patient has no known allergies.   Review of Systems Review of Systems   Physical Exam Triage Vital Signs ED Triage Vitals  Enc Vitals Group     BP 09/05/20 1821 (!) 127/96     Pulse Rate 09/05/20 1821 93     Resp 09/05/20 1821 18     Temp 09/05/20 1821 98 F (36.7 C)     Temp Source 09/05/20 1821 Oral     SpO2 09/05/20 1821 100 %     Weight 09/05/20 1820 160 lb (72.6 kg)     Height 09/05/20 1820 5\' 8"  (1.727 m)     Head Circumference --      Peak Flow --      Pain Score 09/05/20 1820 10     Pain Loc --      Pain Edu? --      Excl. in GC? --     No data found.  Updated Vital Signs BP (!) 127/96 (BP Location: Left Arm)   Pulse 93   Temp 98 F (36.7 C) (Oral)   Resp 18   Ht 5\' 8"  (1.727 m)   Wt 160 lb (72.6 kg)   LMP 08/14/2020   SpO2 100%   BMI 24.33 kg/m   Visual Acuity Right Eye Distance:   Left Eye Distance:   Bilateral Distance:    Right Eye Near:   Left Eye Near:    Bilateral Near:     Physical Exam Constitutional:      General: She is not in acute distress.    Appearance: She is well-developed.  HENT:     Mouth/Throat:      Comments: Blisters noted to midline upper lip and left of midline to lower lip with mild swelling about  Cardiovascular:     Rate and Rhythm: Normal rate.  Pulmonary:     Effort: Pulmonary effort is normal.  Skin:    General: Skin is warm and dry.  Neurological:     Mental Status: She is alert and oriented to person, place, and time.      UC Treatments / Results  Labs (all labs ordered are listed, but only abnormal results are displayed) Labs Reviewed - No data to display  EKG   Radiology No results found.  Procedures Procedures (including critical care time)  Medications Ordered in UC Medications - No data to display  Initial Impression / Assessment and Plan / UC Course  I have reviewed the triage vital signs and the nursing notes.  Pertinent labs & imaging results that were available during my care of the patient were reviewed by me and considered in my medical decision making (see chart for details).     Exam consistent with herpes labialis with valtrex provided and education discussed. Return precautions provided. Patient verbalized understanding and agreeable to plan.   Final Clinical Impressions(s) / UC Diagnoses   Final diagnoses:  Cold sore     Discharge Instructions     Unfortunately these appear consistent with cold sores.  You can use topical abreva, as well as take the prescribed pills. Take one dose tonight, and one dose tomorrow  morning. At this point in the outbreak unfortunately these blisters will still likely worsen a bit more before they completely resolve.  See  provided information about cold sores.  Return for any further concerns.    ED Prescriptions    Medication Sig Dispense Auth. Provider   valACYclovir (VALTREX) 1000 MG tablet Take 2 tablets (2,000 mg total) by mouth 2 (two) times daily for 1 day. 4 tablet Georgetta Haber, NP     PDMP not reviewed this encounter.   Georgetta Haber, NP 09/05/20 1850

## 2020-09-05 NOTE — Discharge Instructions (Signed)
Unfortunately these appear consistent with cold sores.  You can use topical abreva, as well as take the prescribed pills. Take one dose tonight, and one dose tomorrow morning. At this point in the outbreak unfortunately these blisters will still likely worsen a bit more before they completely resolve.  See provided information about cold sores.  Return for any further concerns.

## 2020-09-22 DIAGNOSIS — N971 Female infertility of tubal origin: Secondary | ICD-10-CM | POA: Diagnosis not present

## 2020-11-07 DIAGNOSIS — E282 Polycystic ovarian syndrome: Secondary | ICD-10-CM | POA: Diagnosis not present

## 2020-11-07 DIAGNOSIS — N971 Female infertility of tubal origin: Secondary | ICD-10-CM | POA: Diagnosis not present

## 2020-12-11 DIAGNOSIS — N971 Female infertility of tubal origin: Secondary | ICD-10-CM | POA: Diagnosis not present

## 2020-12-15 DIAGNOSIS — N971 Female infertility of tubal origin: Secondary | ICD-10-CM | POA: Diagnosis not present

## 2020-12-16 DIAGNOSIS — N971 Female infertility of tubal origin: Secondary | ICD-10-CM | POA: Diagnosis not present

## 2020-12-17 DIAGNOSIS — N971 Female infertility of tubal origin: Secondary | ICD-10-CM | POA: Diagnosis not present

## 2020-12-18 DIAGNOSIS — N971 Female infertility of tubal origin: Secondary | ICD-10-CM | POA: Diagnosis not present

## 2020-12-19 DIAGNOSIS — N971 Female infertility of tubal origin: Secondary | ICD-10-CM | POA: Diagnosis not present

## 2020-12-20 DIAGNOSIS — N971 Female infertility of tubal origin: Secondary | ICD-10-CM | POA: Diagnosis not present

## 2020-12-21 DIAGNOSIS — N971 Female infertility of tubal origin: Secondary | ICD-10-CM | POA: Diagnosis not present

## 2020-12-22 DIAGNOSIS — N971 Female infertility of tubal origin: Secondary | ICD-10-CM | POA: Diagnosis not present

## 2020-12-28 ENCOUNTER — Emergency Department
Admission: EM | Admit: 2020-12-28 | Discharge: 2020-12-28 | Disposition: A | Discharge status: Home / Self Care | Payer: BC Managed Care – PPO | Attending: Emergency Medicine | Admitting: Emergency Medicine

## 2020-12-28 ENCOUNTER — Other Ambulatory Visit: Payer: Self-pay

## 2020-12-28 ENCOUNTER — Emergency Department: Payer: BC Managed Care – PPO

## 2020-12-28 DIAGNOSIS — Z8616 Personal history of COVID-19: Secondary | ICD-10-CM | POA: Diagnosis not present

## 2020-12-28 DIAGNOSIS — R079 Chest pain, unspecified: Secondary | ICD-10-CM | POA: Diagnosis not present

## 2020-12-28 DIAGNOSIS — R0602 Shortness of breath: Secondary | ICD-10-CM | POA: Diagnosis not present

## 2020-12-28 DIAGNOSIS — R0600 Dyspnea, unspecified: Secondary | ICD-10-CM | POA: Insufficient documentation

## 2020-12-28 DIAGNOSIS — R0609 Other forms of dyspnea: Secondary | ICD-10-CM

## 2020-12-28 DIAGNOSIS — R0789 Other chest pain: Secondary | ICD-10-CM | POA: Diagnosis not present

## 2020-12-28 LAB — BASIC METABOLIC PANEL
Anion gap: 8 (ref 5–15)
BUN: 9 mg/dL (ref 6–20)
CO2: 25 mmol/L (ref 22–32)
Calcium: 9.1 mg/dL (ref 8.9–10.3)
Chloride: 103 mmol/L (ref 98–111)
Creatinine, Ser: 0.91 mg/dL (ref 0.44–1.00)
GFR, Estimated: >60 mL/min (ref >60)
Glucose, Bld: 91 mg/dL (ref 70–99)
Potassium: 3.7 mmol/L (ref 3.5–5.1)
Sodium: 136 mmol/L (ref 135–145)

## 2020-12-28 LAB — CBC
HCT: 36.6 % (ref 36.0–46.0)
Hemoglobin: 12.5 g/dL (ref 12.0–15.0)
MCH: 30.9 pg (ref 26.0–34.0)
MCHC: 34.2 g/dL (ref 30.0–36.0)
MCV: 90.4 fL (ref 80.0–100.0)
Platelets: 266 10*3/uL (ref 150–400)
RBC: 4.05 MIL/uL (ref 3.87–5.11)
RDW: 12.2 % (ref 11.5–15.5)
WBC: 4.3 10*3/uL (ref 4.0–10.5)
nRBC: 0 % (ref 0.0–0.2)

## 2020-12-28 LAB — TROPONIN I (HIGH SENSITIVITY): Troponin I (High Sensitivity): <2 ng/L (ref <18)

## 2020-12-28 NOTE — ED Provider Notes (Signed)
[facility] Emergency Department Provider Note   ____________________________________________   Event Date/Time   First MD Initiated Contact with Patient 12/28/20 2304     (approximate)  I have reviewed the triage vital signs and the nursing notes.   HISTORY  Chief Complaint Chest Pain   HPI Jordan [name] is a 35 y.o. female who presents for chest pain  LOCATION: Mid chest DURATION: 3 days prior to arrival TIMING: Slightly improved since onset SEVERITY: Severe QUALITY: Chest pain CONTEXT: Patient had a egg removal procedure done just prior to the symptoms beginning and was told by her fertility clinic specialist to present to the emergency department if symptoms do not fully resolve. MODIFYING FACTORS: Slightly worsened with exertion and relieved at rest ASSOCIATED SYMPTOMS: Dyspnea on exertion   Per medical record review, patient has history of egg retrieval process 3 days prior to arrival          Past Medical History:  Diagnosis Date   Blood transfusion without reported diagnosis    COVID-19 09/02/2018   Seizures [facility])     Patient Active Problem List   Diagnosis Date Noted   Genital warts 04/14/2020   Left ovarian cyst 04/14/2020   History of COVID-19 10/10/2019   Chronic dyspnea 10/10/2019   Acute respiratory failure with hypoxia (HCC) 10/10/2019   Bronchitis 10/10/2019   Healthcare-associated pneumonia 09/29/2019   Sinus tachycardia 09/29/2019   Hypokalemia 09/29/2019   Sepsis (HCC) 09/29/2019   Women's annual routine gynecological examination 09/23/2019   Vaginal discharge 09/23/2019    No past surgical history on file.  Prior to Admission medications   Medication Sig Start Date End Date Taking? Authorizing Provider  acetaminophen (TYLENOL) 500 MG tablet Take 1,000 mg by mouth every 8 (eight) hours as needed for moderate pain.    [provider]  albuterol (ACCUNEB) 1.25 MG/3ML nebulizer solution Take 1  ampule by nebulization every 4 (four) hours as needed for wheezing or shortness of breath.    [provider]  albuterol (VENTOLIN HFA) 108 (90 Base) MCG/ACT inhaler Inhale 2 puffs into the lungs every 6 (six) hours as needed for wheezing or shortness of breath. 08/31/18   Phineas Semen, MD  budesonide-formoterol [facility]) 160-4.5 MCG/ACT inhaler Inhale 2 puffs into the lungs 2 (two) times daily.    [provider]  busPIRone (BUSPAR) 5 MG tablet Take 5 mg by mouth 2 (two) times daily. 10/06/19   [provider]  Elderberry 575 MG/5ML SYRP Take 30 mLs by mouth every morning.    [provider]  escitalopram (LEXAPRO) 10 MG tablet Take 10 mg by mouth daily. 10/06/19   [provider]  Nebulizers (COMPRESSOR/NEBULIZER) MISC 1 Units by Does not apply route every 4 (four) hours as needed. 03/16/19   Irean Hong, MD    Allergies Patient has no known allergies.  Family History  Problem Relation Age of Onset   Healthy Mother    Migraines Mother    Healthy Father     Social History Social History   Tobacco Use   Smoking status: Never   Smokeless tobacco: Never  Vaping Use   Vaping Use: Never used  Substance Use Topics   Alcohol use: No   Drug use: No    Review of Systems Constitutional: No fever/chills Eyes: No visual changes. ENT: No sore throat. Cardiovascular: Endorses chest pain. Respiratory: Endorses shortness of breath. Gastrointestinal: No abdominal pain.  No nausea, no vomiting.  No diarrhea. Genitourinary: Negative  for dysuria. Musculoskeletal: Negative for acute arthralgias Skin: Negative for rash. Neurological: Negative for headaches, weakness/numbness/paresthesias in any extremity Psychiatric: Negative for suicidal ideation/homicidal ideation   ____________________________________________   PHYSICAL EXAM:  VITAL SIGNS: ED Triage Vitals  Enc Vitals Group     BP 12/28/20 1902 (!) 132/102     Pulse Rate 12/28/20 1902  96     Resp 12/28/20 1902 18     Temp 12/28/20 1902 98.9 F (37.2 C)     Temp Source 12/28/20 1902 Oral     SpO2 12/28/20 1902 99 %     Weight 12/28/20 1857 165 lb (74.8 kg)     Height 12/28/20 1857 5\' 6"  (1.676 m)     Head Circumference --      Peak Flow --      Pain Score 12/28/20 1857 8     Pain Loc --      Pain Edu? --      Excl. in GC? --    Constitutional: Alert and oriented. Well appearing and in no acute distress. Eyes: Conjunctivae are normal. PERRL. Head: Atraumatic. Nose: No congestion/rhinnorhea. Mouth/Throat: Mucous membranes are moist. Neck: No stridor Cardiovascular: Grossly normal heart sounds.  Good peripheral circulation. Respiratory: Normal respiratory effort.  No retractions. Gastrointestinal: Soft and nontender. No distention. Musculoskeletal: No obvious deformities Neurologic:  Normal speech and language. No gross focal neurologic deficits are appreciated. Skin:  Skin is warm and dry. No rash noted. Psychiatric: Mood and affect are normal. Speech and behavior are normal.  ____________________________________________   LABS (all labs ordered are listed, but only abnormal results are displayed)  Labs Reviewed  BASIC METABOLIC PANEL  CBC  POC URINE PREG, ED  TROPONIN I (HIGH SENSITIVITY)   ____________________________________________  EKG  ED ECG REPORT I, 12/30/20, the attending physician, personally viewed and interpreted this ECG.  Date: 12/28/2020 EKG Time: 1853 Rate: 88 Rhythm: normal sinus rhythm QRS Axis: normal Intervals: normal ST/T Wave abnormalities: normal Narrative Interpretation: no evidence of acute ischemia  ____________________________________________  RADIOLOGY  ED MD interpretation: 2 view chest x-ray shows no evidence of acute abnormalities including no pneumonia, pneumothorax, or widened mediastinum  Official radiology report(s): DG Chest 2 View  Result Date: 12/28/2020 CLINICAL DATA:  Chest pain and  shortness of breath. EXAM: CHEST - 2 VIEW COMPARISON:  October 23, 2019 FINDINGS: The heart size and mediastinal contours are within normal limits. Both lungs are clear. There is S-shaped scoliosis of the thoracolumbar spine. IMPRESSION: No active cardiopulmonary disease. Electronically Signed   By: October 25, 2019 M.D.   On: 12/28/2020 19:49    ____________________________________________   PROCEDURES  Procedure(s) performed (including Critical Care):  .1-3 Lead EKG Interpretation  Date/Time: 12/28/2020 11:33 PM Performed by: 12/30/2020, MD Authorized by: Merwyn Katos, MD     Interpretation: normal     ECG rate:  81   ECG rate assessment: normal     Rhythm: sinus rhythm     Ectopy: none     Conduction: normal     ____________________________________________   INITIAL IMPRESSION / ASSESSMENT AND PLAN / ED COURSE  As part of my medical decision making, I reviewed the following data within the electronic medical record, if available:  Nursing notes reviewed and incorporated, Labs reviewed, EKG interpreted, Old chart reviewed, Radiograph reviewed and Notes from prior ED visits reviewed and incorporated        Workup: ECG, CXR, CBC, BMP, Troponin Findings: ECG: No overt evidence of STEMI. No  evidence of Brugadas sign, delta wave, epsilon wave, significantly prolonged QTc, or malignant arrhythmia HS Troponin: Negative x1 Other Labs unremarkable for emergent problems. CXR: Without PTX, PNA, or widened mediastinum Last Stress Test: Never Last Heart Catheterization: Never HEART Score: 2  Given History, Exam, and Workup I have low suspicion for ACS, Pneumothorax, Pneumonia, Pulmonary Embolus, Tamponade, Aortic Dissection or other emergent problem as a cause for this presentation.   Reassesment: Prior to discharge patients pain was controlled and they were well appearing.  Disposition:  Discharge. Strict return precautions discussed with patient with full understanding.  Advised patient to follow up promptly with primary care provider      ____________________________________________   FINAL CLINICAL IMPRESSION(S) / ED DIAGNOSES  Final diagnoses:  Other chest pain  Dyspnea on exertion     ED Discharge Orders     None        Note:  This document was prepared using Dragon voice recognition software and may include unintentional dictation errors.    Merwyn Katos, MD 12/28/20 [id number]

## 2020-12-28 NOTE — ED Triage Notes (Signed)
Pt to ED for chest pain and shob that started a few days ago. Also reports constipation, however had egg retrieval process a few days ago and was told this is side effect.  NAD noted, RR even and unlabored.

## 2021-01-18 ENCOUNTER — Encounter: Payer: Self-pay | Admitting: Obstetrics and Gynecology

## 2021-01-18 ENCOUNTER — Ambulatory Visit (INDEPENDENT_AMBULATORY_CARE_PROVIDER_SITE_OTHER): Payer: BC Managed Care – PPO | Admitting: Obstetrics and Gynecology

## 2021-01-18 ENCOUNTER — Other Ambulatory Visit: Payer: Self-pay

## 2021-01-18 VITALS — BP 118/87 | HR 93 | Ht 66.0 in | Wt 160.4 lb

## 2021-01-18 DIAGNOSIS — Z1339 Encounter for screening examination for other mental health and behavioral disorders: Secondary | ICD-10-CM

## 2021-01-18 DIAGNOSIS — N760 Acute vaginitis: Secondary | ICD-10-CM

## 2021-01-18 DIAGNOSIS — B9689 Other specified bacterial agents as the cause of diseases classified elsewhere: Secondary | ICD-10-CM

## 2021-01-18 DIAGNOSIS — Z01419 Encounter for gynecological examination (general) (routine) without abnormal findings: Secondary | ICD-10-CM

## 2021-01-18 DIAGNOSIS — Z1331 Encounter for screening for depression: Secondary | ICD-10-CM

## 2021-01-18 MED ORDER — FLUCONAZOLE 150 MG PO TABS: 150.0000 mg | ORAL_TABLET | Freq: Once | ORAL | 0 refills | Status: AC

## 2021-01-18 MED ORDER — METRONIDAZOLE 0.75 % VA GEL: 1.0000 | Freq: Every day | VAGINAL | 1 refills | Status: AC

## 2021-01-18 NOTE — Progress Notes (Signed)
Gynecology Annual Exam  PCP: Marya Fossa, PA-C  Chief Complaint  Patient presents with   Gynecologic Exam    Pt has had egg retrieval; [facility].  Bowel movements are not normal - was told it is normal after egg retrieval.  Has recurrence of BV; has been taking probiotic that helps; no sxs now; nausea.  Egg tx sched end of Oct.   History of Present Illness:  [name] is a 35 y.o. G0P0000 who LMP was Patient's last menstrual period was 01/04/2021 (exact date)., presents today for her annual examination.  She is undergoing fertility treatments at [facility].  She has egg retrieval on 12/23/2020.  She has various symptoms of decreased appetite.    She is sexually active.  She denies pain with intercourse.  .  Last Pap: 08/2019  Results were: atypical squamous cellularity of undetermined significance (ASCUS) /neg HPV DNA negative.  05/2018 - pap was NILM/HPV neg Hx of STDs: warts and distant chlamydia.    There is a FH of breast cancer in her grandmother at age 33. There is no FH of ovarian cancer. The patient does do self-breast exams.  Tobacco use: The patient denies current or previous tobacco use. Alcohol use: none Exercise: none  She has a history of recurrent BV.  She has recently taken a bunch of antibiotics, as part of her egg retrieval.  She has also taken vitamins with probiotics to help prevent BV. She lost her smell 2 years ago as part of covid.  So, she can't tell if the smell is off.  She does have some abnormal discharge.  She does not have any vaginal irritative symptoms.  She is having some constipation since her egg retrieval.  She has had some bowel movements, but they're not normal.    The patient wears seatbelts: yes.   The patient reports that domestic violence in her life is absent.   Past Medical History:  Diagnosis Date   Blood transfusion without reported diagnosis    COVID-19 09/02/2018   Seizures (HCC)     Past Surgical History:  Procedure  Laterality Date   NO PAST SURGERIES      Prior to Admission medications: Denies    Allergies: No Known Allergies  Obstetric History: G0P0000  Social History   Socioeconomic History   Marital status: Married    Spouse name: Not on file   Number of children: Not on file   Years of education: Not on file   Highest education level: Not on file  Occupational History   Not on file  Tobacco Use   Smoking status: Never   Smokeless tobacco: Never  Vaping Use   Vaping Use: Never used  Substance and Sexual Activity   Alcohol use: No   Drug use: No   Sexual activity: Yes    Birth control/protection: None  Other Topics Concern   Not on file  Social History Narrative   Not on file   Social Determinants of Health   Financial Resource Strain: Not on file  Food Insecurity: Not on file  Transportation Needs: Not on file  Physical Activity: Not on file  Stress: Not on file  Social Connections: Not on file  Intimate Partner Violence: Not on file    Family History  Problem Relation Age of Onset   Stomach cancer Paternal Grandmother    Breast cancer Maternal Grandmother    Healthy Father    Healthy Mother    Migraines Mother  Review of Systems  Constitutional: Negative.   HENT: Negative.    Eyes: Negative.   Respiratory: Negative.    Cardiovascular: Negative.   Gastrointestinal:  Positive for constipation. Negative for abdominal pain, blood in stool, diarrhea, heartburn, melena, nausea and vomiting.  Genitourinary: Negative.   Musculoskeletal: Negative.   Skin: Negative.   Neurological: Negative.   Psychiatric/Behavioral: Negative.      Physical Exam BP 118/87   Pulse 93   Ht 5\' 6"  (1.676 m)   Wt 160 lb 6.4 oz (72.8 kg)   LMP 01/04/2021 (Exact Date)   BMI 25.89 kg/m    Physical Exam Constitutional:      General: She is not in acute distress.    Appearance: Normal appearance. She is well-developed.  Genitourinary:     Vulva and bladder normal.     Right  Labia: No rash, tenderness, lesions, skin changes or Bartholin's cyst.    Left Labia: No tenderness, lesions, skin changes, Bartholin's cyst or rash.    No inguinal adenopathy present in the right or left side.    Pelvic Tanner Score: 5/5.    No vaginal discharge, erythema, tenderness or bleeding.      Right Adnexa: not tender, not full and no mass present.    Left Adnexa: not tender, not full and no mass present.    No cervical motion tenderness, discharge, lesion or polyp.     Uterus is not enlarged or tender.     No uterine mass detected.    Pelvic exam was performed with patient in the lithotomy position.  HENT:     Head: Normocephalic and atraumatic.  Eyes:     General: No scleral icterus.    Conjunctiva/sclera: Conjunctivae normal.  Neck:     Thyroid: No thyromegaly.  Cardiovascular:     Rate and Rhythm: Normal rate and regular rhythm.     Heart sounds: No murmur heard.   No friction rub. No gallop.  Pulmonary:     Effort: Pulmonary effort is normal. No respiratory distress.     Breath sounds: Normal breath sounds. No wheezing or rales.  Abdominal:     General: Bowel sounds are normal. There is no distension.     Palpations: Abdomen is soft. There is no mass.     Tenderness: There is no abdominal tenderness. There is no guarding or rebound.     Hernia: There is no hernia in the left inguinal area or right inguinal area.  Musculoskeletal:        General: No swelling or tenderness. Normal range of motion.     Cervical back: Normal range of motion and neck supple.  Lymphadenopathy:     Cervical: No cervical adenopathy.     Lower Body: No right inguinal adenopathy. No left inguinal adenopathy.  Neurological:     General: No focal deficit present.     Mental Status: She is alert and oriented to person, place, and time.     Cranial Nerves: No cranial nerve deficit.  Skin:    General: Skin is warm and dry.     Findings: No erythema or rash.  Psychiatric:        Mood and  Affect: Mood normal.        Behavior: Behavior normal.        Judgment: Judgment normal.  Declines breast exam due to breast tenderness  Female chaperone present for pelvic and breast  portions of the physical exam  Assessment: 35 y.o. G0P0000 female here for  routine annual gynecologic examination  Plan: Problem List Items Addressed This Visit       Other   Women's annual routine gynecological examination - Primary   Other Visit Diagnoses     Screening for depression       Screening for alcoholism       BV (bacterial vaginosis)       Relevant Medications   metroNIDAZOLE (METROGEL) 0.75 % vaginal gel   fluconazole (DIFLUCAN) 150 MG tablet       Screening: -- Blood pressure screen normal -- Weight screening: normal -- Depression screening negative (PHQ-9) -- Nutrition: normal -- cholesterol screening: not due for screening -- osteoporosis screening: not due -- tobacco screening: not using -- alcohol screening: AUDIT questionnaire indicates low-risk usage. -- family history of breast cancer screening: done. not at high risk. -- no evidence of domestic violence or intimate partner violence. -- STD screening: gonorrhea/chlamydia NAAT not collected per patient request. -- pap smear not collected per ASCCP guidelines  Treat for BV.  Thomasene Mohair, MD 01/18/2021 10:43 AM

## 2021-01-24 DIAGNOSIS — N971 Female infertility of tubal origin: Secondary | ICD-10-CM | POA: Diagnosis not present

## 2021-02-11 DIAGNOSIS — N971 Female infertility of tubal origin: Secondary | ICD-10-CM | POA: Diagnosis not present

## 2021-02-26 DIAGNOSIS — Z3202 Encounter for pregnancy test, result negative: Secondary | ICD-10-CM | POA: Diagnosis not present

## 2021-02-26 DIAGNOSIS — N912 Amenorrhea, unspecified: Secondary | ICD-10-CM | POA: Diagnosis not present

## 2021-02-28 DIAGNOSIS — N912 Amenorrhea, unspecified: Secondary | ICD-10-CM | POA: Diagnosis not present

## 2021-03-05 ENCOUNTER — Emergency Department: Payer: BC Managed Care – PPO

## 2021-03-05 ENCOUNTER — Other Ambulatory Visit: Payer: Self-pay

## 2021-03-05 ENCOUNTER — Encounter: Payer: Self-pay | Admitting: Intensive Care

## 2021-03-05 ENCOUNTER — Emergency Department
Admission: EM | Admit: 2021-03-05 | Discharge: 2021-03-05 | Disposition: A | Discharge status: Home / Self Care | Payer: BC Managed Care – PPO | Attending: Emergency Medicine | Admitting: Emergency Medicine

## 2021-03-05 DIAGNOSIS — Z8616 Personal history of COVID-19: Secondary | ICD-10-CM | POA: Diagnosis not present

## 2021-03-05 DIAGNOSIS — R102 Pelvic and perineal pain: Secondary | ICD-10-CM | POA: Insufficient documentation

## 2021-03-05 DIAGNOSIS — O26891 Other specified pregnancy related conditions, first trimester: Secondary | ICD-10-CM | POA: Diagnosis not present

## 2021-03-05 DIAGNOSIS — Z3490 Encounter for supervision of normal pregnancy, unspecified, unspecified trimester: Secondary | ICD-10-CM

## 2021-03-05 DIAGNOSIS — Z3A01 Less than 8 weeks gestation of pregnancy: Secondary | ICD-10-CM | POA: Diagnosis not present

## 2021-03-05 DIAGNOSIS — R1032 Left lower quadrant pain: Secondary | ICD-10-CM | POA: Diagnosis not present

## 2021-03-05 LAB — CBC WITH DIFFERENTIAL/PLATELET
Abs Immature Granulocytes: 0.01 10*3/uL (ref 0.00–0.07)
Basophils Absolute: 0 10*3/uL (ref 0.0–0.1)
Basophils Relative: 1 %
Eosinophils Absolute: 0.1 10*3/uL (ref 0.0–0.5)
Eosinophils Relative: 2 %
HCT: 36.1 % (ref 36.0–46.0)
Hemoglobin: 11.8 g/dL — ABNORMAL LOW (ref 12.0–15.0)
Immature Granulocytes: 0 %
Lymphocytes Relative: 32 %
Lymphs Abs: 1.6 10*3/uL (ref 0.7–4.0)
MCH: 29.2 pg (ref 26.0–34.0)
MCHC: 32.7 g/dL (ref 30.0–36.0)
MCV: 89.4 fL (ref 80.0–100.0)
Monocytes Absolute: 0.5 10*3/uL (ref 0.1–1.0)
Monocytes Relative: 10 %
Neutro Abs: 2.8 10*3/uL (ref 1.7–7.7)
Neutrophils Relative %: 55 %
Platelets: 264 10*3/uL (ref 150–400)
RBC: 4.04 MIL/uL (ref 3.87–5.11)
RDW: 13.1 % (ref 11.5–15.5)
WBC: 5 10*3/uL (ref 4.0–10.5)
nRBC: 0 % (ref 0.0–0.2)

## 2021-03-05 LAB — BASIC METABOLIC PANEL
Anion gap: 5 (ref 5–15)
BUN: 10 mg/dL (ref 6–20)
CO2: 26 mmol/L (ref 22–32)
Calcium: 9.2 mg/dL (ref 8.9–10.3)
Chloride: 104 mmol/L (ref 98–111)
Creatinine, Ser: 0.88 mg/dL (ref 0.44–1.00)
GFR, Estimated: >60 mL/min (ref >60)
Glucose, Bld: 112 mg/dL — ABNORMAL HIGH (ref 70–99)
Potassium: 4 mmol/L (ref 3.5–5.1)
Sodium: 135 mmol/L (ref 135–145)

## 2021-03-05 LAB — URINALYSIS, COMPLETE (UACMP) WITH MICROSCOPIC
Bacteria, UA: NONE SEEN
Bilirubin Urine: NEGATIVE
Glucose, UA: NEGATIVE mg/dL
Hgb urine dipstick: NEGATIVE
Ketones, ur: NEGATIVE mg/dL
Leukocytes,Ua: NEGATIVE
Nitrite: NEGATIVE
Protein, ur: NEGATIVE mg/dL
Specific Gravity, Urine: 1.019 (ref 1.005–1.030)
pH: 7 (ref 5.0–8.0)

## 2021-03-05 LAB — POC URINE PREG, ED: Preg Test, Ur: POSITIVE — AB

## 2021-03-05 LAB — HCG, QUANTITATIVE, PREGNANCY: hCG, Beta Chain, Quant, S: 11520 m[IU]/mL — ABNORMAL HIGH (ref <5)

## 2021-03-05 LAB — ABO/RH: ABO/RH(D): A POS

## 2021-03-05 MED ORDER — ACETAMINOPHEN 325 MG PO TABS
650.0000 mg | ORAL_TABLET | Freq: Once | ORAL | Status: AC
  Administered 2021-03-05: 650 mg via ORAL
  Filled 2021-03-05: qty 2

## 2021-03-05 NOTE — ED Provider Notes (Signed)
[facility] Emergency Department Provider Note  ____________________________________________   Event Date/Time   First MD Initiated Contact with Patient 03/05/21 1609     (approximate)  I have reviewed the triage vital signs and the nursing notes.   HISTORY  Chief Complaint Abdominal Pain    HPI Jordan [name] is a 35 y.o. female who comes in with concerns of left lower quadrant pain.  Patient reports having IVF transfer in [redacted] weeks pregnant.  Transfer was done at the end of October.  She reports severe left-sided cramping pain and vaginal pressure.  Patient reports that this started today, constant, nothing makes it better or worse.  Denies taking anything for it.  She denies any vaginal discharge, concern for STDs.          Past Medical History:  Diagnosis Date   Blood transfusion without reported diagnosis    COVID-19 09/02/2018   Seizures [facility])     Patient Active Problem List   Diagnosis Date Noted   Genital warts 04/14/2020   Left ovarian cyst 04/14/2020   History of COVID-19 10/10/2019   Chronic dyspnea 10/10/2019   Acute respiratory failure with hypoxia (HCC) 10/10/2019   Bronchitis 10/10/2019   Healthcare-associated pneumonia 09/29/2019   Sinus tachycardia 09/29/2019   Hypokalemia 09/29/2019   Sepsis (HCC) 09/29/2019   Women's annual routine gynecological examination 09/23/2019   Vaginal discharge 09/23/2019    Past Surgical History:  Procedure Laterality Date   NO PAST SURGERIES      Prior to Admission medications   Medication Sig Start Date End Date Taking? Authorizing Provider  acetaminophen (TYLENOL) 500 MG tablet Take 1,000 mg by mouth every 8 (eight) hours as needed for moderate pain.    [provider]  budesonide-formoterol (SYMBICORT) 160-4.5 MCG/ACT inhaler Inhale 2 puffs into the lungs 2 (two) times daily.    [provider]  busPIRone (BUSPAR) 5 MG tablet Take 5 mg by mouth 2 (two) times daily.  10/06/19   [provider]  Elderberry 575 MG/5ML SYRP Take 30 mLs by mouth every morning.    [provider]  escitalopram (LEXAPRO) 10 MG tablet Take 10 mg by mouth daily. 10/06/19   [provider]    Allergies Patient has no known allergies.  Family History  Problem Relation Age of Onset   Stomach cancer Paternal Grandmother    Breast cancer Maternal Grandmother    Healthy Father    Healthy Mother    Migraines Mother     Social History Social History   Tobacco Use   Smoking status: Never   Smokeless tobacco: Never  Vaping Use   Vaping Use: Never used  Substance Use Topics   Alcohol use: No   Drug use: No      Review of Systems Constitutional: No fever/chills Eyes: No visual changes. ENT: No sore throat. Cardiovascular: Denies chest pain. Respiratory: Denies shortness of breath. Gastrointestinal: Positive abdominal pain no nausea, no vomiting.  No diarrhea.  No constipation. Genitourinary: Negative for dysuria. Musculoskeletal: Negative for back pain. Skin: Negative for rash. Neurological: Negative for headaches, focal weakness or numbness. All other ROS negative ____________________________________________   PHYSICAL EXAM:  VITAL SIGNS: ED Triage Vitals  Enc Vitals Group     BP 03/05/21 1404 (!) 129/93     Pulse Rate 03/05/21 1404 (!) 112     Resp 03/05/21 1404 19     Temp 03/05/21 1404 99.2 F (37.3 C)     Temp Source 03/05/21  1404 Oral     SpO2 03/05/21 1404 98 %     Weight 03/05/21 1406 165 lb (74.8 kg)     Height 03/05/21 1406 5\' 7"  (1.702 m)     Head Circumference --      Peak Flow --      Pain Score 03/05/21 1413 9     Pain Loc --      Pain Edu? --      Excl. in GC? --     Constitutional: Alert and oriented. Well appearing and in no acute distress. Eyes: Conjunctivae are normal. EOMI. Head: Atraumatic. Nose: No congestion/rhinnorhea. Mouth/Throat: Mucous membranes are moist.   Neck: No stridor. Trachea Midline.  FROM Cardiovascular: Normal rate, regular rhythm. Grossly normal heart sounds.  Good peripheral circulation. Respiratory: Normal respiratory effort.  No retractions. Lungs CTAB. Gastrointestinal: Slightly tender in the left lower quadrant. No distention. No abdominal bruits.  Musculoskeletal: No lower extremity tenderness nor edema.  No joint effusions. Neurologic:  Normal speech and language. No gross focal neurologic deficits are appreciated.  Skin:  Skin is warm, dry and intact. No rash noted. Psychiatric: Mood and affect are normal. Speech and behavior are normal. GU: Deferred   ____________________________________________   LABS (all labs ordered are listed, but only abnormal results are displayed)  Labs Reviewed  BASIC METABOLIC PANEL - Abnormal; Notable for the following components:      Result Value   Glucose, Bld 112 (*)    All other components within normal limits  CBC WITH DIFFERENTIAL/PLATELET - Abnormal; Notable for the following components:   Hemoglobin 11.8 (*)    All other components within normal limits  HCG, QUANTITATIVE, PREGNANCY - Abnormal; Notable for the following components:   hCG, Beta Chain, Quant, S 11,520 (*)    All other components within normal limits  URINALYSIS, COMPLETE (UACMP) WITH MICROSCOPIC - Abnormal; Notable for the following components:   Color, Urine YELLOW (*)    APPearance HAZY (*)    All other components within normal limits  POC URINE PREG, ED - Abnormal; Notable for the following components:   Preg Test, Ur POSITIVE (*)    All other components within normal limits  ABO/RH   ____________________________________________  RADIOLOGY   Official radiology report(s): 13/06/22 OB LESS THAN 14 WEEKS WITH OB TRANSVAGINAL  Addendum Date: 03/05/2021   ADDENDUM REPORT: 03/05/2021 16:58 ADDENDUM: On today's imaging, it is unclear whether the thick walled fluid-filled structure in the left adnexa arises from the left ovary or is adjacent to the left  ovary. I was told by the referring physician that this has not been worked up with MRI. This finding will need workup with MRI at some point. Review of the images demonstrates blood flow in both ovaries. Spectral tracing images were not obtained. Electronically Signed   By: 13/09/2020 III M.D.   On: 03/05/2021 16:58   Result Date: 03/05/2021 CLINICAL DATA:  Left lower quadrant pain.  Recent IVF. EXAM: OBSTETRIC <14 WK 13/09/2020 AND TRANSVAGINAL OB US TECHNIQUE: Both transabdominal and transvaginal ultrasound examinations were performed for complete evaluation of the gestation as well as the maternal uterus, adnexal regions, and pelvic cul-de-sac. Transvaginal technique was performed to assess early pregnancy. COMPARISON:  Pelvic ultrasounds from February 04, 2020, April 18, 2020, and April 20, 2020. FINDINGS: Intrauterine gestational sac: There is a probable small gestational sac. Yolk sac:  Not Visualized. Embryo:  Not Visualized. MSD: 5.6 mm   5 w   2 d CRL:  mm    w    d                  [place] EDC: Subchorionic hemorrhage:  None visualized. Maternal uterus/adnexae: The right ovary is normal in appearance. There is a thick walled fluid-filled structure either arising from or immediately adjacent to the left ovary. On previous imaging, this was thought to be within the left ovary. Apparently the patient was diagnosed with hydrosalpinx in the interval in this could represent thickened tube. Regardless, this is not a new finding. IMPRESSION: 1. Probable early intrauterine gestational sac, but no yolk sac, fetal pole, or cardiac activity yet visualized. Recommend follow-up quantitative B-HCG levels and follow-up US in 14 days to assess viability. This recommendation follows SRU consensus guidelines: Diagnostic Criteria for Nonviable Pregnancy Early in the First Trimester. Malva Limes Med 2013; 756:4332-95. 2. Thick walled fluid-filled structure either arising from or immediately adjacent to the left ovary, similar to  April 20, 2020. By report, this has been previously worked up and was thought to represent an hydrosalpinx. Recommend correlation with previous workup. If this has not been worked up, it will need to be further evaluated with MRI at some point. Electronically Signed: By: Gerome Sam III M.D. On: 03/05/2021 16:19    ____________________________________________   PROCEDURES  Procedure(s) performed (including Critical Care):  Procedures   ____________________________________________   INITIAL IMPRESSION / ASSESSMENT AND PLAN / ED COURSE  Jordan [name] was evaluated in Emergency Department on 03/05/2021 for the symptoms described in the history of present illness. She was evaluated in the context of the global COVID-19 pandemic, which necessitated consideration that the patient might be at risk for infection with the SARS-CoV-2 virus that causes COVID-19. Institutional protocols and algorithms that pertain to the evaluation of patients at risk for COVID-19 are in a state of rapid change based on information released by regulatory bodies including the CDC and federal and state organizations. These policies and algorithms were followed during the patient's care in the ED.    Patient comes in with concern for left lower quadrant pain in the setting of pregnancy and recent implantation at the end of October.  Labs ordered evaluate for her hCG level.  Urine to evaluate for UTI.  Exam she is no evidence of CVA tenderness to suggest pyelonephritis or kidney stone.  Ultrasound ordered to make sure no evidence of ectopic, ovarian issue is.  We will give some Tylenol Patient denies any symptoms of PID and patient is married we discussed pelvic exam but opted to hold off due to low risk, no symptoms   Pregnancy test was positive.  Labs were reassuring her hCG is appropriately elevated at 11,000 urine without evidence of UTI does have some squamous cells in it  Ultrasound comments on a early  intrauterine gestational sac but there is also this fluid-filled structure that looked adjacent or near the left ovary.  I did call the radiologist and talked further with him and although Dopplers were not done it looks like patient had blood flow in both ovaries therefore seeing her back for repeat Doppler does not seem to be necessary due to low risk of torsion.  I also reviewed her prior ultrasound level showed cyst on that side.  According to patient she had an ultrasound done with the fertility clinic and at 1 point there was no cyst there so I did discuss that she stood still discussed with them about getting an outpatient MRI she  expressed understanding.  I also discussed with Dr. Tiburcio Pea from OBGYN to review her ultrasound and see if she needs any additional imaging at this time he just recommends a repeat beta-hCG in 48 hours.  After the Tylenol patient's pain is now relieved.  She feels comfortable going home and will follow-up with this outpatient for repeat beta-hCG.  She understands if she develops severe pain on the left side that she needs to return to the ER immediately      ____________________________________________   FINAL CLINICAL IMPRESSION(S) / ED DIAGNOSES   Final diagnoses:  Pelvic pain  Early stage of pregnancy      MEDICATIONS GIVEN DURING THIS VISIT:  Medications  acetaminophen (TYLENOL) tablet 650 mg (650 mg Oral Given 03/05/21 1631)     ED Discharge Orders     None        Note:  This document was prepared using Dragon voice recognition software and may include unintentional dictation errors.    Concha Se, MD 03/05/21 1902

## 2021-03-05 NOTE — ED Provider Notes (Signed)
Emergency Medicine Provider Triage Evaluation Note  Jordan [name] , a 35 y.o. female  was evaluated in triage.  Pt complains of left lower quadrant pain.  Had recent IVF.  States she is [redacted] weeks pregnant.  Severe cramping and pressure on the left side and vagina.  Review of Systems  Positive: Abdominal pain pregnancy Negative: No vaginal discharge, bleeding, fever/chills  Physical Exam  BP (!) 129/93 (BP Location: Left Arm)   Pulse (!) 112   Temp 99.2 F (37.3 C) (Oral)   Resp 19   Ht 5\' 7"  (1.702 m)   Wt 74.8 kg   SpO2 98%   BMI 25.84 kg/m  Gen:   Awake, no distress   Resp:  Normal effort  MSK:   Moves extremities without difficulty  Other:  Abdomen tender left lower quadrant  Medical Decision Making  Medically screening exam initiated at 2:16 PM.  Appropriate orders placed.  was informed that the remainder of the evaluation will be completed by another provider, this initial triage assessment does not replace that evaluation, and the importance of remaining in the ED until their evaluation is complete.     [name], PA-C 03/05/21 1417    13/06/22, MD 03/05/21 [phone]

## 2021-03-05 NOTE — ED Notes (Signed)
DC paperwork provided to patient. Pt refused vs at dc. Pt provided verbal consent for dc. P Followup referrals provided to patient. Pt assisted off unit on foot,. No questions at this time

## 2021-03-05 NOTE — ED Triage Notes (Signed)
Patient is experiencing left sided severe cramping with pressure on side and vagina pressure. Denies discharge/bleeding. Reports IVF transfer and [redacted] weeks pregnant.

## 2021-03-05 NOTE — Discharge Instructions (Addendum)
I discussed her case with Dr. Tiburcio Pea from OB/GYN who recommends a repeat hCG in 48 hours.  Your value today was 11,520.  If develop severe worsening pain on that side you need to return the ER immediately.  You can take Tylenol 1 g every 8 hours to help with your pain. you can try to get the repeat level done with your fertility clinic or [facility].  Discussed with your OB/GYN if you need to have outpatient MRI     IMPRESSION:  1. Probable early intrauterine gestational sac, but no yolk sac,  fetal pole, or cardiac activity yet visualized. Recommend follow-up  quantitative B-HCG levels and follow-up US in 14 days to assess  viability. This recommendation follows SRU consensus guidelines:  Diagnostic Criteria for Nonviable Pregnancy Early in the First  Trimester. Malva Limes Med 2013; 045:9977-41.  2. Thick walled fluid-filled structure either arising from or  immediately adjacent to the left ovary, similar to April 20, 2020. By report, this has been previously worked up and was thought  to represent an hydrosalpinx. Recommend correlation with previous  workup. If this has not been worked up, it will need to be further  evaluated with MRI at some point.

## 2021-03-17 DIAGNOSIS — N912 Amenorrhea, unspecified: Secondary | ICD-10-CM | POA: Diagnosis not present

## 2021-04-01 ENCOUNTER — Other Ambulatory Visit: Payer: Self-pay

## 2021-04-01 ENCOUNTER — Emergency Department: Payer: BC Managed Care – PPO

## 2021-04-01 ENCOUNTER — Encounter: Payer: Self-pay | Admitting: Intensive Care

## 2021-04-01 ENCOUNTER — Emergency Department
Admission: EM | Admit: 2021-04-01 | Discharge: 2021-04-01 | Disposition: A | Discharge status: Home / Self Care | Payer: BC Managed Care – PPO | Attending: Emergency Medicine | Admitting: Emergency Medicine

## 2021-04-01 DIAGNOSIS — Z8616 Personal history of COVID-19: Secondary | ICD-10-CM | POA: Insufficient documentation

## 2021-04-01 DIAGNOSIS — O26899 Other specified pregnancy related conditions, unspecified trimester: Secondary | ICD-10-CM

## 2021-04-01 DIAGNOSIS — R102 Pelvic and perineal pain: Secondary | ICD-10-CM | POA: Diagnosis not present

## 2021-04-01 DIAGNOSIS — N9489 Other specified conditions associated with female genital organs and menstrual cycle: Secondary | ICD-10-CM | POA: Insufficient documentation

## 2021-04-01 DIAGNOSIS — O2341 Unspecified infection of urinary tract in pregnancy, first trimester: Secondary | ICD-10-CM | POA: Diagnosis not present

## 2021-04-01 DIAGNOSIS — Z20822 Contact with and (suspected) exposure to covid-19: Secondary | ICD-10-CM | POA: Insufficient documentation

## 2021-04-01 DIAGNOSIS — Z3A09 9 weeks gestation of pregnancy: Secondary | ICD-10-CM | POA: Insufficient documentation

## 2021-04-01 DIAGNOSIS — O26891 Other specified pregnancy related conditions, first trimester: Secondary | ICD-10-CM | POA: Diagnosis not present

## 2021-04-01 LAB — COMPREHENSIVE METABOLIC PANEL
ALT: 13 U/L (ref 0–44)
AST: 17 U/L (ref 15–41)
Albumin: 3.7 g/dL (ref 3.5–5.0)
Alkaline Phosphatase: 43 U/L (ref 38–126)
Anion gap: 5 (ref 5–15)
BUN: 8 mg/dL (ref 6–20)
CO2: 23 mmol/L (ref 22–32)
Calcium: 9.2 mg/dL (ref 8.9–10.3)
Chloride: 104 mmol/L (ref 98–111)
Creatinine, Ser: 0.79 mg/dL (ref 0.44–1.00)
GFR, Estimated: >60 mL/min (ref >60)
Glucose, Bld: 87 mg/dL (ref 70–99)
Potassium: 4 mmol/L (ref 3.5–5.1)
Sodium: 132 mmol/L — ABNORMAL LOW (ref 135–145)
Total Bilirubin: 0.8 mg/dL (ref 0.3–1.2)
Total Protein: 7.2 g/dL (ref 6.5–8.1)

## 2021-04-01 LAB — CBC
HCT: 37 % (ref 36.0–46.0)
Hemoglobin: 12.6 g/dL (ref 12.0–15.0)
MCH: 30.9 pg (ref 26.0–34.0)
MCHC: 34.1 g/dL (ref 30.0–36.0)
MCV: 90.7 fL (ref 80.0–100.0)
Platelets: 234 10*3/uL (ref 150–400)
RBC: 4.08 MIL/uL (ref 3.87–5.11)
RDW: 13.8 % (ref 11.5–15.5)
WBC: 5.5 10*3/uL (ref 4.0–10.5)
nRBC: 0 % (ref 0.0–0.2)

## 2021-04-01 LAB — URINALYSIS, ROUTINE W REFLEX MICROSCOPIC
Bilirubin Urine: NEGATIVE
Glucose, UA: 50 mg/dL — AB
Hgb urine dipstick: NEGATIVE
Ketones, ur: 5 mg/dL — AB
Leukocytes,Ua: NEGATIVE
Nitrite: NEGATIVE
Protein, ur: 30 mg/dL — AB
Specific Gravity, Urine: 1.027 (ref 1.005–1.030)
Squamous Epithelial / HPF: >50 — ABNORMAL HIGH (ref 0–5)
pH: 6 (ref 5.0–8.0)

## 2021-04-01 LAB — HCG, QUANTITATIVE, PREGNANCY: hCG, Beta Chain, Quant, S: 380950 m[IU]/mL — ABNORMAL HIGH (ref <5)

## 2021-04-01 LAB — RESP PANEL BY RT-PCR (FLU A&B, COVID) ARPGX2
Influenza A by PCR: NEGATIVE
Influenza B by PCR: NEGATIVE
SARS Coronavirus 2 by RT PCR: NEGATIVE

## 2021-04-01 MED ORDER — CEPHALEXIN 500 MG PO CAPS
500.0000 mg | ORAL_CAPSULE | Freq: Once | ORAL | Status: AC
  Administered 2021-04-01: 500 mg via ORAL
  Filled 2021-04-01: qty 1

## 2021-04-01 MED ORDER — CEPHALEXIN 500 MG PO CAPS: 500.0000 mg | ORAL_CAPSULE | Freq: Four times a day (QID) | ORAL | 0 refills | Status: AC

## 2021-04-01 NOTE — ED Notes (Signed)
Ultrasound to pick up patient

## 2021-04-01 NOTE — ED Provider Notes (Signed)
[facility] Emergency Department Provider Note  ____________________________________________  Time seen: Approximately 4:47 PM  I have reviewed the triage vital signs and the nursing notes.   HISTORY  Chief Complaint No chief complaint on file.    HPI Jordan [name] is a 35 y.o. female who presents the emergency department for multiple complaints.  Patient states that she is [redacted] weeks pregnant, is feeling "not like myself.  "She states that this is been ongoing x2 to 3 days.  Patient is not able to qualify whether this is body aches, congestion, cough.  She states that "I know I am pregnant but I just do not feel like my normal self.  "No chest pain, shortness of breath.  Patient is having some left pelvic pain.  She was evaluated several weeks ago, noted to be pregnant with an early gestational sac as well as having a fluid-filled lesion in the left pelvis that is possibly hydrosalpinx.  Patient has not followed up for outpatient MRI with her OB/GYN.  Patient does not have any vaginal bleeding or discharge.  No changes in urination.  Patient states that she does feel nauseous but is not having any emesis.  No diarrhea or constipation.  Patient endorses some generalized weakness/tired feeling.  No unilateral weakness.       Past Medical History:  Diagnosis Date   Blood transfusion without reported diagnosis    COVID-19 09/02/2018   Seizures [facility])     Patient Active Problem List   Diagnosis Date Noted   Genital warts 04/14/2020   Left ovarian cyst 04/14/2020   History of COVID-19 10/10/2019   Chronic dyspnea 10/10/2019   Acute respiratory failure with hypoxia (HCC) 10/10/2019   Bronchitis 10/10/2019   Healthcare-associated pneumonia 09/29/2019   Sinus tachycardia 09/29/2019   Hypokalemia 09/29/2019   Sepsis (HCC) 09/29/2019   Women's annual routine gynecological examination 09/23/2019   Vaginal discharge 09/23/2019    Past Surgical History:   Procedure Laterality Date   NO PAST SURGERIES      Prior to Admission medications   Medication Sig Start Date End Date Taking? Authorizing Provider  cephALEXin (KEFLEX) 500 MG capsule Take 1 capsule (500 mg total) by mouth 4 (four) times daily for 7 days. 04/01/21 04/08/21 Yes Josphine Laffey, Delorise Royals, PA-C  acetaminophen (TYLENOL) 500 MG tablet Take 1,000 mg by mouth every 8 (eight) hours as needed for moderate pain.    [provider]  budesonide-formoterol (SYMBICORT) 160-4.5 MCG/ACT inhaler Inhale 2 puffs into the lungs 2 (two) times daily.    [provider]  busPIRone (BUSPAR) 5 MG tablet Take 5 mg by mouth 2 (two) times daily. 10/06/19   [provider]  Elderberry 575 MG/5ML SYRP Take 30 mLs by mouth every morning.    [provider]  escitalopram (LEXAPRO) 10 MG tablet Take 10 mg by mouth daily. 10/06/19   [provider]    Allergies Patient has no known allergies.  Family History  Problem Relation Age of Onset   Stomach cancer Paternal Grandmother    Breast cancer Maternal Grandmother    Healthy Father    Healthy Mother    Migraines Mother     Social History Social History   Tobacco Use   Smoking status: Never   Smokeless tobacco: Never  Vaping Use   Vaping Use: Never used  Substance Use Topics   Alcohol use: No   Drug use: No     Review of Systems  Constitutional: No fever/chills.  Positive for generalized malaise and weakness Eyes: No visual changes. No discharge ENT: No upper respiratory complaints. Cardiovascular: no chest pain. Respiratory: no cough. No SOB. Gastrointestinal: Patient is pregnant.  Left pelvic pain.  No other abdominal pain.  No nausea, no vomiting.  No diarrhea.  No constipation. Genitourinary: Negative for dysuria. No hematuria.  No vaginal bleeding or discharge Musculoskeletal: Negative for musculoskeletal pain. Skin: Negative for rash, abrasions, lacerations, ecchymosis. Neurological: Negative  for headaches, focal weakness or numbness.  10 System ROS otherwise negative.  ____________________________________________   PHYSICAL EXAM:  VITAL SIGNS: ED Triage Vitals  Enc Vitals Group     BP 04/01/21 1633 (!) 121/99     Pulse Rate 04/01/21 1633 98     Resp 04/01/21 1633 18     Temp 04/01/21 1633 98.7 F (37.1 C)     Temp Source 04/01/21 1633 Oral     SpO2 04/01/21 1633 100 %     Weight 04/01/21 1613 150 lb (68 kg)     Height 04/01/21 1613 5\' 7"  (1.702 m)     Head Circumference --      Peak Flow --      Pain Score 04/01/21 1612 10     Pain Loc --      Pain Edu? --      Excl. in [place]? --      Constitutional: Alert and oriented. Well appearing and in no acute distress. Eyes: Conjunctivae are normal. PERRL. EOMI. Head: Atraumatic. ENT:      Ears:       Nose: No congestion/rhinnorhea.      Mouth/Throat: Mucous membranes are moist.  Neck: No stridor.   Hematological/Lymphatic/Immunilogical: No cervical lymphadenopathy. Cardiovascular: Normal rate, regular rhythm. Normal S1 and S2.  Good peripheral circulation. Respiratory: Normal respiratory effort without tachypnea or retractions. Lungs CTAB. Good air entry to the bases with no decreased or absent breath sounds. Gastrointestinal: Gravid abdomen.  Bowel sounds 4 quadrants.  Soft to palpation all quadrants.  Patient is tender in the left lower quadrant/left suprapubic region.  No palpable abnormalities in this region.  No other tenderness.. No guarding or rigidity. No palpable masses. No distention. No CVA tenderness. Musculoskeletal: Full range of motion to all extremities. No gross deformities appreciated. Neurologic:  Normal speech and language. No gross focal neurologic deficits are appreciated.  Skin:  Skin is warm, dry and intact. No rash noted. Psychiatric: Mood and affect are normal. Speech and behavior are normal. Patient exhibits appropriate insight and  judgement.   ____________________________________________   LABS (all labs ordered are listed, but only abnormal results are displayed)  Labs Reviewed  URINALYSIS, ROUTINE W REFLEX MICROSCOPIC - Abnormal; Notable for the following components:      Result Value   Color, Urine AMBER (*)    APPearance CLOUDY (*)    Glucose, UA 50 (*)    Ketones, ur 5 (*)    Protein, ur 30 (*)    Bacteria, UA MANY (*)    Squamous Epithelial / LPF >50 (*)    All other components within normal limits  COMPREHENSIVE METABOLIC PANEL - Abnormal; Notable for the following components:   Sodium 132 (*)    All other components within normal limits  HCG, QUANTITATIVE, PREGNANCY - Abnormal; Notable for the following components:   hCG, Beta Chain, Quant, S 380,950 (*)    All other components within normal limits  RESP PANEL BY RT-PCR (FLU A&B, COVID) ARPGX2  URINE CULTURE  CBC   ____________________________________________  EKG   ____________________________________________  RADIOLOGY I personally viewed and evaluated these images as part of my medical decision making, as well as reviewing the written report by the radiologist.  ED Provider Interpretation: Ongoing fluid-filled lesion without significant changes identified.  Single intrauterine pregnancy again identified with no complications.  US OB Comp Less 14 Wks  Result Date: 04/01/2021 CLINICAL DATA:  Left adnexal mass.  Worsening pain. EXAM: OBSTETRIC <14 WK [place] AND TRANSVAGINAL OB US ARTERIAL AND VENOUS ULTRASOUND OF THE ABDOMEN AND PELVIS TECHNIQUE: Both transabdominal and transvaginal ultrasound examinations were performed for complete evaluation of the gestation as well as the maternal uterus, adnexal regions, and pelvic cul-de-sac. Transvaginal technique was performed to assess early pregnancy. COMPARISON:  March 05, 2021 FINDINGS: Intrauterine gestational sac: Single Yolk sac:  Visualized. Embryo:  Visualized. Cardiac Activity: Visualized.  Heart Rate: 182 bpm CRL:  25 mm   9 w   2 d                  [place] EDC: 11/02/2021 Subchorionic hemorrhage:  None visualized. Maternal uterus/adnexae: Normal appearance of the right ovary. Again seen is 2.0 x 1.8 x 0.2 cm cystic structure adjacent to the left ovary. Normal arterial and venous flow to both ovaries. IMPRESSION: Single live intrauterine pregnancy corresponding to 9 weeks and 2 days gestation. Again seen is a 2 cm cystic structure adjacent to the left ovary, etiology uncertain. Electronically Signed   By: Fidela Salisbury M.D.   On: 04/01/2021 19:56   US PELVIC DOPPLER (TORSION R/O OR MASS ARTERIAL FLOW)  Result Date: 04/01/2021 CLINICAL DATA:  Left adnexal mass.  Worsening pain. EXAM: OBSTETRIC <14 WK [place] AND TRANSVAGINAL OB US ARTERIAL AND VENOUS ULTRASOUND OF THE ABDOMEN AND PELVIS TECHNIQUE: Both transabdominal and transvaginal ultrasound examinations were performed for complete evaluation of the gestation as well as the maternal uterus, adnexal regions, and pelvic cul-de-sac. Transvaginal technique was performed to assess early pregnancy. COMPARISON:  March 05, 2021 FINDINGS: Intrauterine gestational sac: Single Yolk sac:  Visualized. Embryo:  Visualized. Cardiac Activity: Visualized. Heart Rate: 182 bpm CRL:  25 mm   9 w   2 d                  [place] EDC: 11/02/2021 Subchorionic hemorrhage:  None visualized. Maternal uterus/adnexae: Normal appearance of the right ovary. Again seen is 2.0 x 1.8 x 0.2 cm cystic structure adjacent to the left ovary. Normal arterial and venous flow to both ovaries. IMPRESSION: Single live intrauterine pregnancy corresponding to 9 weeks and 2 days gestation. Again seen is a 2 cm cystic structure adjacent to the left ovary, etiology uncertain. Electronically Signed   By: Fidela Salisbury M.D.   On: 04/01/2021 19:56    ____________________________________________    PROCEDURES  Procedure(s) performed:    Procedures    Medications  cephALEXin (KEFLEX)  capsule 500 mg (has no administration in time range)     ____________________________________________   INITIAL IMPRESSION / ASSESSMENT AND PLAN / ED COURSE  Pertinent labs & imaging results that were available during my care of the patient were reviewed by me and considered in my medical decision making (see chart for details).  Review of the [place] CSRS was performed in accordance of the [place] prior to dispensing any controlled drugs.           Patient's diagnosis is consistent with UTI in pregnancy, [redacted] weeks pregnant.  Patient presented to the emergency department while feeling "off."  Patient could  not exactly verify what that meant.  Patient had some nausea but no emesis.  No URI symptoms.  She did have some lower pelvic pain on the left side.  Patient had a history of a fluid-filled lesion on the left side that was still awaiting outpatient MRI for further characterization.  Differential for today included viral illness, flu, COVID, UTI, ovarian torsion, ovarian cyst.  Urinalysis revealed bacteria.  Labs are otherwise reassuring.  Ultrasound revealed intrauterine pregnancy, and ongoing fluid-filled lesion.  No significant changes in this lesion.  Patient has no vaginal discharge, no acute vaginal bleeding.  At this time with bacteria and just feeling "off I will treat for UTI and culture the urine.  Follow-up with OB/GYN.  Return precautions discussed with the patient this time.  Patient is given ED precautions to return to the ED for any worsening or new symptoms.     ____________________________________________  FINAL CLINICAL IMPRESSION(S) / ED DIAGNOSES  Final diagnoses:  Pelvic pain  Urinary tract infection in mother during first trimester of pregnancy  [redacted] weeks gestation of pregnancy      NEW MEDICATIONS STARTED DURING THIS VISIT:  ED Discharge Orders          Ordered    cephALEXin (KEFLEX) 500 MG capsule  4 times daily        04/01/21 2020                 This chart was dictated using voice recognition software/Dragon. Despite best efforts to proofread, errors can occur which can change the meaning. Any change was purely unintentional.    Brynda Peon 04/01/21 2025    Carrie Mew, MD 04/02/21 2123

## 2021-04-01 NOTE — ED Triage Notes (Signed)
Pt reports is [redacted] weeks pregnant and is having pain to her left lower abd and feels weak and tired.

## 2021-04-03 LAB — URINE CULTURE

## 2021-04-14 DIAGNOSIS — Z113 Encounter for screening for infections with a predominantly sexual mode of transmission: Secondary | ICD-10-CM | POA: Diagnosis not present

## 2021-04-14 DIAGNOSIS — O09811 Supervision of pregnancy resulting from assisted reproductive technology, first trimester: Secondary | ICD-10-CM | POA: Diagnosis not present

## 2021-04-14 DIAGNOSIS — Z3491 Encounter for supervision of normal pregnancy, unspecified, first trimester: Secondary | ICD-10-CM | POA: Diagnosis not present

## 2021-04-14 DIAGNOSIS — Z6824 Body mass index (BMI) 24.0-24.9, adult: Secondary | ICD-10-CM | POA: Diagnosis not present

## 2021-04-14 DIAGNOSIS — Z3A11 11 weeks gestation of pregnancy: Secondary | ICD-10-CM | POA: Diagnosis not present

## 2021-04-14 DIAGNOSIS — Z124 Encounter for screening for malignant neoplasm of cervix: Secondary | ICD-10-CM | POA: Diagnosis not present

## 2021-04-14 LAB — OB RESULTS CONSOLE RUBELLA ANTIBODY, IGM: Rubella: IMMUNE

## 2021-04-14 LAB — OB RESULTS CONSOLE ABO/RH: RH Type: POSITIVE

## 2021-04-14 LAB — OB RESULTS CONSOLE ANTIBODY SCREEN: Antibody Screen: NEGATIVE

## 2021-04-14 LAB — OB RESULTS CONSOLE GC/CHLAMYDIA
Chlamydia: NEGATIVE
Neisseria Gonorrhea: NEGATIVE

## 2021-04-14 LAB — OB RESULTS CONSOLE HEPATITIS B SURFACE ANTIGEN: Hepatitis B Surface Ag: NEGATIVE

## 2021-04-14 LAB — OB RESULTS CONSOLE RPR: RPR: NONREACTIVE

## 2021-04-14 LAB — OB RESULTS CONSOLE HIV ANTIBODY (ROUTINE TESTING): HIV: NONREACTIVE

## 2021-04-14 LAB — HEPATITIS C ANTIBODY: HCV Ab: NEGATIVE

## 2021-04-22 ENCOUNTER — Other Ambulatory Visit: Payer: Self-pay

## 2021-04-22 ENCOUNTER — Encounter: Payer: Self-pay | Admitting: Emergency Medicine

## 2021-04-22 ENCOUNTER — Emergency Department
Admission: EM | Admit: 2021-04-22 | Discharge: 2021-04-22 | Disposition: A | Discharge status: Home / Self Care | Payer: BC Managed Care – PPO | Attending: Emergency Medicine | Admitting: Emergency Medicine

## 2021-04-22 DIAGNOSIS — Z8616 Personal history of COVID-19: Secondary | ICD-10-CM | POA: Insufficient documentation

## 2021-04-22 DIAGNOSIS — R509 Fever, unspecified: Secondary | ICD-10-CM | POA: Insufficient documentation

## 2021-04-22 DIAGNOSIS — Z3A12 12 weeks gestation of pregnancy: Secondary | ICD-10-CM | POA: Diagnosis not present

## 2021-04-22 DIAGNOSIS — J111 Influenza due to unidentified influenza virus with other respiratory manifestations: Secondary | ICD-10-CM

## 2021-04-22 DIAGNOSIS — Z20822 Contact with and (suspected) exposure to covid-19: Secondary | ICD-10-CM | POA: Insufficient documentation

## 2021-04-22 DIAGNOSIS — O98511 Other viral diseases complicating pregnancy, first trimester: Secondary | ICD-10-CM | POA: Insufficient documentation

## 2021-04-22 DIAGNOSIS — O219 Vomiting of pregnancy, unspecified: Secondary | ICD-10-CM | POA: Diagnosis not present

## 2021-04-22 DIAGNOSIS — J101 Influenza due to other identified influenza virus with other respiratory manifestations: Secondary | ICD-10-CM | POA: Diagnosis not present

## 2021-04-22 DIAGNOSIS — M791 Myalgia, unspecified site: Secondary | ICD-10-CM | POA: Insufficient documentation

## 2021-04-22 DIAGNOSIS — R110 Nausea: Secondary | ICD-10-CM | POA: Diagnosis not present

## 2021-04-22 DIAGNOSIS — E860 Dehydration: Secondary | ICD-10-CM | POA: Diagnosis not present

## 2021-04-22 LAB — CBC WITH DIFFERENTIAL/PLATELET
Abs Immature Granulocytes: 0.01 10*3/uL (ref 0.00–0.07)
Basophils Absolute: 0 10*3/uL (ref 0.0–0.1)
Basophils Relative: 1 %
Eosinophils Absolute: 0.1 10*3/uL (ref 0.0–0.5)
Eosinophils Relative: 1 %
HCT: 36.1 % (ref 36.0–46.0)
Hemoglobin: 12.4 g/dL (ref 12.0–15.0)
Immature Granulocytes: 0 %
Lymphocytes Relative: 9 %
Lymphs Abs: 0.3 10*3/uL — ABNORMAL LOW (ref 0.7–4.0)
MCH: 31.4 pg (ref 26.0–34.0)
MCHC: 34.3 g/dL (ref 30.0–36.0)
MCV: 91.4 fL (ref 80.0–100.0)
Monocytes Absolute: 0.6 10*3/uL (ref 0.1–1.0)
Monocytes Relative: 15 %
Neutro Abs: 2.7 10*3/uL (ref 1.7–7.7)
Neutrophils Relative %: 74 %
Platelets: 194 10*3/uL (ref 150–400)
RBC: 3.95 MIL/uL (ref 3.87–5.11)
RDW: 13.2 % (ref 11.5–15.5)
WBC: 3.7 10*3/uL — ABNORMAL LOW (ref 4.0–10.5)
nRBC: 0 % (ref 0.0–0.2)

## 2021-04-22 LAB — COMPREHENSIVE METABOLIC PANEL
ALT: 15 U/L (ref 0–44)
AST: 22 U/L (ref 15–41)
Albumin: 3.4 g/dL — ABNORMAL LOW (ref 3.5–5.0)
Alkaline Phosphatase: 43 U/L (ref 38–126)
Anion gap: 4 — ABNORMAL LOW (ref 5–15)
BUN: 7 mg/dL (ref 6–20)
CO2: 24 mmol/L (ref 22–32)
Calcium: 8.9 mg/dL (ref 8.9–10.3)
Chloride: 106 mmol/L (ref 98–111)
Creatinine, Ser: 0.77 mg/dL (ref 0.44–1.00)
GFR, Estimated: >60 mL/min (ref >60)
Glucose, Bld: 86 mg/dL (ref 70–99)
Potassium: 3.6 mmol/L (ref 3.5–5.1)
Sodium: 134 mmol/L — ABNORMAL LOW (ref 135–145)
Total Bilirubin: 0.9 mg/dL (ref 0.3–1.2)
Total Protein: 7.1 g/dL (ref 6.5–8.1)

## 2021-04-22 LAB — RESP PANEL BY RT-PCR (FLU A&B, COVID) ARPGX2
Influenza A by PCR: POSITIVE — AB
Influenza B by PCR: NEGATIVE
SARS Coronavirus 2 by RT PCR: NEGATIVE

## 2021-04-22 MED ORDER — METOCLOPRAMIDE HCL 5 MG/ML IJ SOLN
10.0000 mg | Freq: Once | INTRAMUSCULAR | Status: AC
  Administered 2021-04-22: 14:00:00 10 mg via INTRAVENOUS
  Filled 2021-04-22: qty 2

## 2021-04-22 MED ORDER — METOCLOPRAMIDE HCL 10 MG PO TABS: 10.0000 mg | ORAL_TABLET | Freq: Three times a day (TID) | ORAL | 0 refills | Status: DC

## 2021-04-22 MED ORDER — SODIUM CHLORIDE 0.9 % IV BOLUS
1000.0000 mL | Freq: Once | INTRAVENOUS | Status: AC
  Administered 2021-04-22: 14:00:00 1000 mL via INTRAVENOUS

## 2021-04-22 NOTE — ED Provider Notes (Signed)
[facility] Emergency Department Provider Note  ____________________________________________   Event Date/Time   First MD Initiated Contact with Patient 04/22/21 1231     (approximate)  I have reviewed the triage vital signs    HISTORY  Chief Complaint Generalized Body Aches, Fever, and Nasal Congestion (/)    HPI Jordan [name] is a 35 y.o. female who presents with viral symptoms. Pt reports multiple symptoms including body aches, fever, congestion.  Patient reports that Tamiflu is already prescribed by her OB/GYN.  Patient is approximately [redacted] weeks pregnant.  Patient reports that she just feels a little dehydrated.  She states that she feels nauseous and having some vomiting.  Denies any abdominal pain.  No vaginal bleeding.  Reports a little bit of shortness of breath.  No leg swelling or other concerns.  On 12/3 she had a negative urine as well as an ultrasound confirming intrauterine pregnancy.   Past Medical History:  Diagnosis Date   Blood transfusion without reported diagnosis    COVID-19 09/02/2018   Seizures [facility])     Patient Active Problem List   Diagnosis Date Noted   Genital warts 04/14/2020   Left ovarian cyst 04/14/2020   History of COVID-19 10/10/2019   Chronic dyspnea 10/10/2019   Acute respiratory failure with hypoxia (HCC) 10/10/2019   Bronchitis 10/10/2019   Healthcare-associated pneumonia 09/29/2019   Sinus tachycardia 09/29/2019   Hypokalemia 09/29/2019   Sepsis (HCC) 09/29/2019   Women's annual routine gynecological examination 09/23/2019   Vaginal discharge 09/23/2019    Past Surgical History:  Procedure Laterality Date   NO PAST SURGERIES      Prior to Admission medications   Medication Sig Start Date End Date Taking? Authorizing Provider  acetaminophen (TYLENOL) 500 MG tablet Take 1,000 mg by mouth every 8 (eight) hours as needed for moderate pain.    [provider]  budesonide-formoterol  (SYMBICORT) 160-4.5 MCG/ACT inhaler Inhale 2 puffs into the lungs 2 (two) times daily.    [provider]  busPIRone (BUSPAR) 5 MG tablet Take 5 mg by mouth 2 (two) times daily. 10/06/19   [provider]  Elderberry 575 MG/5ML SYRP Take 30 mLs by mouth every morning.    [provider]  escitalopram (LEXAPRO) 10 MG tablet Take 10 mg by mouth daily. 10/06/19   [provider]    Allergies Patient has no known allergies.  Family History  Problem Relation Age of Onset   Stomach cancer Paternal Grandmother    Breast cancer Maternal Grandmother    Healthy Father    Healthy Mother    Migraines Mother     Social History Social History   Tobacco Use   Smoking status: Never   Smokeless tobacco: Never  Vaping Use   Vaping Use: Never used  Substance Use Topics   Alcohol use: No   Drug use: No      Review of Systems Constitutional: No fever/chills, positive dehydration Eyes: No visual changes. ENT: No sore throat. Cardiovascular: Denies chest pain. Respiratory: Positive shortness of breath Gastrointestinal: No abdominal pain.  Positive nausea, vomiting.  No diarrhea.  No constipation. Genitourinary: Negative for dysuria. Musculoskeletal: Negative for back pain. Skin: Negative for rash. Neurological: Negative for headaches, focal weakness or numbness. All other ROS negative ____________________________________________   PHYSICAL EXAM:  VITAL SIGNS: ED Triage Vitals  Enc Vitals Group     BP 04/22/21 1209 (!) 128/99     Pulse Rate 04/22/21 1209 (!) 119  Resp 04/22/21 1209 20     Temp 04/22/21 1209 99 F (37.2 C)     Temp Source 04/22/21 1209 Oral     SpO2 04/22/21 1209 98 %     Weight 04/22/21 1212 150 lb (68 kg)     Height 04/22/21 1212 5\' 7"  (1.702 m)     Head Circumference --      Peak Flow --      Pain Score 04/22/21 1212 0     Pain Loc --      Pain Edu? --      Excl. in [place]? --     Constitutional: Alert and oriented. Well  appearing and in no acute distress. Eyes: Conjunctivae are normal. EOMI. Head: Atraumatic. Nose: No congestion/rhinnorhea. Mouth/Throat: Mucous membranes are moist.   Neck: No stridor. Trachea Midline. FROM Cardiovascular: Tachycardic, regular rhythm. Good peripheral circulation. Respiratory: no audible stridor, no increased work of breathing  Gastrointestinal: Soft and nontender. No distention.  Musculoskeletal: No lower extremity tenderness nor edema.  No joint effusions. Neurologic:  Normal speech and language. No gross focal neurologic deficits are appreciated.  Skin:  Skin is warm, dry and intact. No rash noted. Psychiatric: Mood and affect are normal. Speech and behavior are normal. GU: Deferred   ____________________________________________   LABS (all labs ordered are listed, but only abnormal results are displayed)  Labs Reviewed  RESP PANEL BY RT-PCR (FLU A&B, COVID) ARPGX2   ____________________________________________    PROCEDURES  Procedure(s) performed (including Critical Care):  Procedures   ____________________________________________   INITIAL IMPRESSION / ASSESSMENT AND PLAN / ED COURSE  Jordan [name] was evaluated in Emergency Department on 04/22/2021 for the symptoms described in the history of present illness. She was evaluated in the context of the global COVID-19 pandemic, which necessitated consideration that the patient might be at risk for infection with the SARS-CoV-2 virus that causes COVID-19. Institutional protocols and algorithms that pertain to the evaluation of patients at risk for COVID-19 are in a state of rapid change based on information released by regulatory bodies including the CDC and federal and state organizations. These policies and algorithms were followed during the patient's care in the ED.     Patient comes in pregnant, tachycardic with symptoms of nausea, vomiting, and generalized body aches.  Patient already took Tylenol  this morning.  Unable to give Toradol due to being pregnant.  We will get urine to make sure no evidence of UTI, COVID, flu swab we will give some fluids and Reglan to help symptoms and get some labs to make sure no Electra abnormalities, AKI.  Fetal heart tones were not taken given no issues with the baby given no abdominal pain, no vaginal bleeding.  Offered patient chest x-ray for the shortness of breath but she declines which I think is reasonable given oxygen levels are 98 to 100% and she is got clear lungs throughout I suspect it is just from the flu.  Labs are reassuring.  No evidence of severe dehydration.  Her flu test was positive as expected.  Patient is already on Tamiflu.  No urinary symptoms and is already had a urine analysis this pregnancy without evidence of asymptomatic bacteria.  Patient feeling much better after fluids, Reglan.  Repeat abdominal exam she remains soft and nontender.  She is ready for discharge home.  I discussed the provisional nature of ED diagnosis, the treatment so far, the ongoing plan of care, follow up appointments and return precautions with the patient and  any family or support people present. They expressed understanding and agreed with the plan, discharged home.        ____________________________________________   FINAL CLINICAL IMPRESSION(S) / ED DIAGNOSES   Final diagnoses:  Influenza      MEDICATIONS GIVEN DURING THIS VISIT:  Medications  sodium chloride 0.9 % bolus 1,000 mL (1,000 mLs Intravenous New Bag/Given 04/22/21 1334)  metoCLOPramide (REGLAN) injection 10 mg (10 mg Intravenous Given 04/22/21 1337)     ED Discharge Orders     None        Note:  This document was prepared using Dragon voice recognition software and may include unintentional dictation errors.   Concha Se, MD 04/22/21 [phone]

## 2021-04-22 NOTE — ED Notes (Signed)
Verbal hold on FHT from EDP [place] since pt denies abd pain and denies bleeding and states has OBGYN appointment Jan 13th.

## 2021-04-22 NOTE — ED Notes (Signed)
See triage note. Pt [redacted] weeks pregnant. Reports N/V x2 days; denies diarrhea. Pt reports she thought she had "a fever and a head cold" last night. Pt laying calmly on stretcher; skin dry; resp reg/unlabored.

## 2021-04-22 NOTE — ED Notes (Signed)
Dc instructions and scripts reviewed with pt no questions or concerns at this time. Will follow up as needed. Ambulated to lobby to wait for husband to transport home.

## 2021-04-22 NOTE — ED Triage Notes (Signed)
Pt via POV from home. Pt has flu like symptoms pt states her husband tested positive for the flu. Pt has already had tamiflu prescribed by her OBGYN. Pt states that she approx [redacted] weeks pregnant.

## 2021-04-22 NOTE — ED Notes (Signed)
Pt given another warm blanket. Understands urine sample needed once possible. Stretcher locked low. Rail up. Call bell within reach.

## 2021-04-22 NOTE — Discharge Instructions (Signed)
Continue taking your Tamiflu.  Return to the ER for worsening shortness of breath, unable to tolerate p.o. or any other concerns.  You can take Tylenol 1 g every 6-8 hours.  Do not take ibuprofen

## 2021-04-25 ENCOUNTER — Encounter (HOSPITAL_COMMUNITY): Payer: Self-pay | Admitting: Obstetrics and Gynecology

## 2021-04-25 ENCOUNTER — Other Ambulatory Visit: Payer: Self-pay

## 2021-04-25 ENCOUNTER — Inpatient Hospital Stay (HOSPITAL_COMMUNITY)
Admission: AD | Admit: 2021-04-25 | Discharge: 2021-04-25 | Disposition: A | Discharge status: Home / Self Care | Payer: BC Managed Care – PPO | Attending: Obstetrics and Gynecology | Admitting: Obstetrics and Gynecology

## 2021-04-25 DIAGNOSIS — O26891 Other specified pregnancy related conditions, first trimester: Secondary | ICD-10-CM | POA: Diagnosis not present

## 2021-04-25 DIAGNOSIS — R109 Unspecified abdominal pain: Secondary | ICD-10-CM

## 2021-04-25 DIAGNOSIS — O209 Hemorrhage in early pregnancy, unspecified: Secondary | ICD-10-CM | POA: Diagnosis not present

## 2021-04-25 DIAGNOSIS — O26899 Other specified pregnancy related conditions, unspecified trimester: Secondary | ICD-10-CM | POA: Diagnosis not present

## 2021-04-25 DIAGNOSIS — O26852 Spotting complicating pregnancy, second trimester: Secondary | ICD-10-CM

## 2021-04-25 DIAGNOSIS — R102 Pelvic and perineal pain: Secondary | ICD-10-CM | POA: Diagnosis not present

## 2021-04-25 DIAGNOSIS — Z3A12 12 weeks gestation of pregnancy: Secondary | ICD-10-CM

## 2021-04-25 LAB — WET PREP, GENITAL
Clue Cells Wet Prep HPF POC: NONE SEEN
Sperm: NONE SEEN
Trich, Wet Prep: NONE SEEN
WBC, Wet Prep HPF POC: <10 (ref <10)
Yeast Wet Prep HPF POC: NONE SEEN

## 2021-04-25 LAB — URINALYSIS, ROUTINE W REFLEX MICROSCOPIC
Bilirubin Urine: NEGATIVE
Glucose, UA: NEGATIVE mg/dL
Ketones, ur: 80 mg/dL — AB
Leukocytes,Ua: NEGATIVE
Nitrite: NEGATIVE
Protein, ur: 100 mg/dL — AB
Specific Gravity, Urine: 1.03 (ref 1.005–1.030)
pH: 5 (ref 5.0–8.0)

## 2021-04-25 MED ORDER — ACETAMINOPHEN 500 MG PO TABS
1000.0000 mg | ORAL_TABLET | Freq: Once | ORAL | Status: AC
  Administered 2021-04-25: 21:00:00 1000 mg via ORAL
  Filled 2021-04-25: qty 2

## 2021-04-25 NOTE — Discharge Instructions (Signed)

## 2021-04-25 NOTE — MAU Note (Signed)
PT SAYS [facility] - WITH DR Claiborne Billings  WENT TO [place] ON [address] - DX WITH FLU  PT CALLED DR MARINONE TONIGHT - TOLD TO COME HERE- IF CONTINUED TO BLEEDING. VB STARTED AT 5PM- WHILE LAYING ON SOFA -  PAD ON IN TRIAGE -  SMALL AMT BROWN D/C. CRAMPING STARTED ON WAY TO HOSPITAL- NO MEDS

## 2021-04-25 NOTE — MAU Provider Note (Signed)
History     CSN: [id number]  Arrival date and time: 04/25/21 2778   Event Date/Time   First Provider Initiated Contact with Patient 04/25/21 2018      Chief Complaint  Patient presents with   Vaginal Bleeding    CRAMPING    HPI Jordan [name] is a 35 y.o. G1P0000 at 101w2d who presents with vagianl bleeding. She states she had a gush of bright red bleeding one time while on the couch. Since then, she has seen brown or light red spotting when she wipes. She reports lower abdominal cramping that she rates a 5/10 and tylenol helps when she takes it. She states she is not drinking any water today because she is nauseous and did not take her reglan today.   OB History     Gravida  1   Para  0   Term  0   Preterm  0   AB  0   Living  0      SAB  0   IAB  0   Ectopic  0   Multiple  0   Live Births  0           Past Medical History:  Diagnosis Date   Blood transfusion without reported diagnosis    COVID-19 09/02/2018   Seizures (HCC)     Past Surgical History:  Procedure Laterality Date   NO PAST SURGERIES      Family History  Problem Relation Age of Onset   Stomach cancer Paternal Grandmother    Breast cancer Maternal Grandmother    Healthy Father    Healthy Mother    Migraines Mother     Social History   Tobacco Use   Smoking status: Never   Smokeless tobacco: Never  Vaping Use   Vaping Use: Never used  Substance Use Topics   Alcohol use: No   Drug use: No    Allergies: No Known Allergies  Medications Prior to Admission  Medication Sig Dispense Refill Last Dose   acetaminophen (TYLENOL) 500 MG tablet Take 1,000 mg by mouth every 8 (eight) hours as needed for moderate pain.   04/25/2021   Elderberry 575 MG/5ML SYRP Take 30 mLs by mouth every morning.   04/24/2021   metoCLOPramide (REGLAN) 10 MG tablet Take 1 tablet (10 mg total) by mouth 3 (three) times daily with meals for 3 days. 9 tablet 0 04/24/2021   budesonide-formoterol  (SYMBICORT) 160-4.5 MCG/ACT inhaler Inhale 2 puffs into the lungs 2 (two) times daily.   More than a month   busPIRone (BUSPAR) 5 MG tablet Take 5 mg by mouth 2 (two) times daily.   More than a month   escitalopram (LEXAPRO) 10 MG tablet Take 10 mg by mouth daily.   More than a month    Review of Systems  Constitutional: Negative.  Negative for fatigue and fever.  HENT: Negative.    Respiratory: Negative.  Negative for shortness of breath.   Cardiovascular: Negative.  Negative for chest pain.  Gastrointestinal:  Positive for abdominal pain. Negative for constipation, diarrhea, nausea and vomiting.  Genitourinary:  Positive for vaginal bleeding. Negative for dysuria and vaginal discharge.  Neurological: Negative.  Negative for dizziness and headaches.  Physical Exam   Blood pressure 128/84, pulse (!) 133, temperature 99.1 F (37.3 C), resp. rate 20, height 5\' 7"  (1.702 m), weight 67.9 kg, last menstrual period 01/23/2021, SpO2 99 %.  Physical Exam Vitals and nursing note reviewed.  Constitutional:  General: She is not in acute distress.    Appearance: She is well-developed.  HENT:     Head: Normocephalic.  Eyes:     Pupils: Pupils are equal, round, and reactive to light.  Cardiovascular:     Rate and Rhythm: Normal rate and regular rhythm.     Heart sounds: Normal heart sounds.  Pulmonary:     Effort: Pulmonary effort is normal. No respiratory distress.     Breath sounds: Normal breath sounds.  Abdominal:     General: Bowel sounds are normal. There is no distension.     Palpations: Abdomen is soft.     Tenderness: There is no abdominal tenderness.  Genitourinary:    Comments: Pelvic exam: Cervix pink, visually closed, without lesion, scant white creamy discharge, vaginal walls and external genitalia normal Bimanual exam: Cervix 0/long/high, firm, anterior, neg CMT, uterus nontender, adnexa without tenderness, enlargement, or mass   Skin:    General: Skin is warm and dry.   Neurological:     Mental Status: She is alert and oriented to person, place, and time.  Psychiatric:        Mood and Affect: Mood normal.        Behavior: Behavior normal.        Thought Content: Thought content normal.        Judgment: Judgment normal.   FHT: 160 bpm  MAU Course  Procedures Results for orders placed or performed during the hospital encounter of 04/25/21 (from the past 24 hour(s))  Urinalysis, Routine w reflex microscopic     Status: Abnormal   Collection Time: 04/25/21  7:21 PM  Result Value Ref Range   Color, Urine AMBER (A) YELLOW   APPearance CLOUDY (A) CLEAR   Specific Gravity, Urine 1.030 1.005 - 1.030   pH 5.0 5.0 - 8.0   Glucose, UA NEGATIVE NEGATIVE mg/dL   Hgb urine dipstick LARGE (A) NEGATIVE   Bilirubin Urine NEGATIVE NEGATIVE   Ketones, ur 80 (A) NEGATIVE mg/dL   Protein, ur 638 (A) NEGATIVE mg/dL   Nitrite NEGATIVE NEGATIVE   Leukocytes,Ua NEGATIVE NEGATIVE   RBC / HPF 11-20 0 - 5 RBC/hpf   WBC, UA 11-20 0 - 5 WBC/hpf   Bacteria, UA MANY (A) NONE SEEN   Squamous Epithelial / LPF 11-20 0 - 5   Mucus PRESENT   Wet prep, genital     Status: None   Collection Time: 04/25/21  8:35 PM   Specimen: PATH Cytology Cervicovaginal Ancillary Only  Result Value Ref Range   Yeast Wet Prep HPF POC NONE SEEN NONE SEEN   Trich, Wet Prep NONE SEEN NONE SEEN   Clue Cells Wet Prep HPF POC NONE SEEN NONE SEEN   WBC, Wet Prep HPF POC <10 <10   Sperm NONE SEEN     MDM UA Wet prep and gc/chlamydia Tylenol PO  Discussed importance of hydration in pregnancy and encouraged patient to use antiemetics as needed.   Assessment and Plan   1. Abdominal pain affecting pregnancy   2. Spotting affecting pregnancy in second trimester   3. [redacted] weeks gestation of pregnancy    -Discharge home in stable condition -Second trimester precautions discussed -Patient advised to follow-up with OB as scheduled for prenatal care -Patient may return to MAU as needed or if her  condition were to change or worsen   Rolm Bookbinder CNM 04/25/2021, 8:18 PM

## 2021-04-26 LAB — GC/CHLAMYDIA PROBE AMP (~~LOC~~) NOT AT ARMC
Chlamydia: NEGATIVE
Comment: NEGATIVE
Comment: NORMAL
Neisseria Gonorrhea: NEGATIVE

## 2021-04-30 NOTE — L&D Delivery Note (Signed)
Delivery Note Jordan [name] is a G1P0000 at [redacted]w[redacted]d who had a spontaneous delivery at 6 a viable female ("Summer")  was delivered via  OA.  APGAR: 9, 9; weight 7lb 5.8oz (3340g) .     Admitted for term IOL. Induced with cytotec, foley, pit. Progressed normally. Received epidural for pain management. Amnioinfusion given for variable decelerations. Pushed for 1 hour 25 minutes, variable decels with pushes. Baby was delivered without difficulty. No nuchal cord.  Delayed cord clamping for 60 seconds.  Delivery of placenta was spontaneous. Placenta was found to be intact, 3 -vessel cord was noted. The fundus was found to be firm. 1st degree perineal laceration was repaired in the normal sterile fashion with 3-0 vicryl. Estimated blood loss 150cc.  Instrument and gauze counts were correct at the end of the procedure.   Placenta status: to L&D   Anesthesia:  epidural Episiotomy:  none Lacerations:  1st degree perineal Suture Repair: 3.0 vicryl Est. Blood Loss (mL):  150  Mom to postpartum.  Baby to Couplet care / Skin to Skin.  Charlett Nose 10/29/2021, 1:19 AM

## 2021-05-12 DIAGNOSIS — Z369 Encounter for antenatal screening, unspecified: Secondary | ICD-10-CM | POA: Diagnosis not present

## 2021-05-12 DIAGNOSIS — Z3482 Encounter for supervision of other normal pregnancy, second trimester: Secondary | ICD-10-CM | POA: Diagnosis not present

## 2021-05-15 ENCOUNTER — Encounter: Payer: Self-pay | Admitting: Emergency Medicine

## 2021-05-15 ENCOUNTER — Other Ambulatory Visit: Payer: Self-pay

## 2021-05-15 ENCOUNTER — Ambulatory Visit
Admission: EM | Admit: 2021-05-15 | Discharge: 2021-05-15 | Disposition: A | Discharge status: Home / Self Care | Payer: BC Managed Care – PPO | Attending: Physician Assistant | Admitting: Physician Assistant

## 2021-05-15 DIAGNOSIS — O98812 Other maternal infectious and parasitic diseases complicating pregnancy, second trimester: Secondary | ICD-10-CM | POA: Insufficient documentation

## 2021-05-15 DIAGNOSIS — L298 Other pruritus: Secondary | ICD-10-CM | POA: Insufficient documentation

## 2021-05-15 DIAGNOSIS — R519 Headache, unspecified: Secondary | ICD-10-CM | POA: Insufficient documentation

## 2021-05-15 DIAGNOSIS — Z20822 Contact with and (suspected) exposure to covid-19: Secondary | ICD-10-CM | POA: Diagnosis not present

## 2021-05-15 DIAGNOSIS — N760 Acute vaginitis: Secondary | ICD-10-CM

## 2021-05-15 DIAGNOSIS — J069 Acute upper respiratory infection, unspecified: Secondary | ICD-10-CM | POA: Insufficient documentation

## 2021-05-15 DIAGNOSIS — H5710 Ocular pain, unspecified eye: Secondary | ICD-10-CM | POA: Diagnosis not present

## 2021-05-15 DIAGNOSIS — R0981 Nasal congestion: Secondary | ICD-10-CM | POA: Diagnosis not present

## 2021-05-15 DIAGNOSIS — O23592 Infection of other part of genital tract in pregnancy, second trimester: Secondary | ICD-10-CM | POA: Diagnosis not present

## 2021-05-15 DIAGNOSIS — O99512 Diseases of the respiratory system complicating pregnancy, second trimester: Secondary | ICD-10-CM | POA: Diagnosis not present

## 2021-05-15 DIAGNOSIS — O26892 Other specified pregnancy related conditions, second trimester: Secondary | ICD-10-CM | POA: Insufficient documentation

## 2021-05-15 DIAGNOSIS — Z3A16 16 weeks gestation of pregnancy: Secondary | ICD-10-CM | POA: Diagnosis not present

## 2021-05-15 DIAGNOSIS — N898 Other specified noninflammatory disorders of vagina: Secondary | ICD-10-CM | POA: Diagnosis not present

## 2021-05-15 DIAGNOSIS — B9689 Other specified bacterial agents as the cause of diseases classified elsewhere: Secondary | ICD-10-CM | POA: Diagnosis not present

## 2021-05-15 DIAGNOSIS — B3731 Acute candidiasis of vulva and vagina: Secondary | ICD-10-CM | POA: Diagnosis not present

## 2021-05-15 LAB — URINALYSIS, COMPLETE (UACMP) WITH MICROSCOPIC
Bilirubin Urine: NEGATIVE
Glucose, UA: NEGATIVE mg/dL
Hgb urine dipstick: NEGATIVE
Ketones, ur: NEGATIVE mg/dL
Leukocytes,Ua: NEGATIVE
Nitrite: NEGATIVE
Protein, ur: NEGATIVE mg/dL
Specific Gravity, Urine: 1.02 (ref 1.005–1.030)
Squamous Epithelial / HPF: >50 (ref 0–5)
pH: 7 (ref 5.0–8.0)

## 2021-05-15 LAB — RESP PANEL BY RT-PCR (FLU A&B, COVID) ARPGX2
Influenza A by PCR: NEGATIVE
Influenza B by PCR: NEGATIVE
SARS Coronavirus 2 by RT PCR: NEGATIVE

## 2021-05-15 LAB — WET PREP, GENITAL
Sperm: NONE SEEN
Trich, Wet Prep: NONE SEEN
WBC, Wet Prep HPF POC: <10 — AB (ref <10)

## 2021-05-15 MED ORDER — MICONAZOLE NITRATE 2 % EX CREA: 1.0000 "application " | TOPICAL_CREAM | Freq: Two times a day (BID) | CUTANEOUS | 0 refills | Status: AC

## 2021-05-15 MED ORDER — METRONIDAZOLE 0.75 % VA GEL
1.0000 | Freq: Every day | VAGINAL | 0 refills | Status: AC
Start: 2021-05-15 — End: 2021-05-22

## 2021-05-15 NOTE — ED Triage Notes (Signed)
Pt c/o left eye pain and pressure, body aches, and subjective fever. She also has vaginal irritation, itching. Denies vaginal discharge. Also started about 2 days ago. Gyn told her to have covid/flu test and BP checked. She states she had the flu about 3 week ago. She is [redacted] weeks pregnant.

## 2021-05-15 NOTE — ED Provider Notes (Signed)
[facility]    CSN: [id number] Arrival date & time: 05/15/21  1545      History   Chief Complaint Chief Complaint  Patient presents with   Eye Pain   Generalized Body Aches   Vaginal Itching    HPI IZSABELLA [name] is a 36 y.o. female who is [redacted] weeks pregnant.  Today patient has presenting for 2-day history of nasal congestion, left-sided headache and eye pain, body aches and feeling feverish.  Also reports some mild cough.  Denies sore throat or recorded fever.  No known flu or COVID exposure.  Reports influenza illness about 3 weeks ago.  Has been taking Tylenol for the headache. Additionally she reports vaginal irritation and itching for the past few days.  Denies any discharge, pelvic pain, abnormal bleeding or concern for STIs.  No dysuria, urinary frequency or urgency.  No other complaints.  HPI  Past Medical History:  Diagnosis Date   Blood transfusion without reported diagnosis    COVID-19 09/02/2018   Seizures [facility])     Patient Active Problem List   Diagnosis Date Noted   Genital warts 04/14/2020   Left ovarian cyst 04/14/2020   History of COVID-19 10/10/2019   Chronic dyspnea 10/10/2019   Acute respiratory failure with hypoxia ([place]) 10/10/2019   Bronchitis 10/10/2019   Healthcare-associated pneumonia 09/29/2019   Sinus tachycardia 09/29/2019   Hypokalemia 09/29/2019   Sepsis ([place]) 09/29/2019   Women's annual routine gynecological examination 09/23/2019   Vaginal discharge 09/23/2019    Past Surgical History:  Procedure Laterality Date   NO PAST SURGERIES      OB History     Gravida  1   Para  0   Term  0   Preterm  0   AB  0   Living  0      SAB  0   IAB  0   Ectopic  0   Multiple  0   Live Births  0            Home Medications    Prior to Admission medications   Medication Sig Start Date End Date Taking? Authorizing Provider  acetaminophen (TYLENOL) 500 MG tablet Take 1,000 mg by mouth every 8 (eight) hours  as needed for moderate pain.   Yes [provider]  aspirin EC 81 MG tablet Take 81 mg by mouth daily. Swallow whole.   Yes [provider]  metroNIDAZOLE (METROGEL VAGINAL) 0.75 % vaginal gel Place 1 Applicatorful vaginally at bedtime for 7 days. 05/15/21 05/22/21 Yes Laurene Footman B, PA-C  miconazole (MICOTIN) 2 % cream Apply 1 application topically 2 (two) times daily for 7 days. 05/15/21 05/22/21 Yes Danton Clap, PA-C  Prenatal Vit-Fe Fumarate-FA (MULTIVITAMIN-PRENATAL) 27-0.8 MG TABS tablet Take 1 tablet by mouth daily at 12 noon.   Yes [provider]  budesonide-formoterol (SYMBICORT) 160-4.5 MCG/ACT inhaler Inhale 2 puffs into the lungs 2 (two) times daily.    [provider]  busPIRone (BUSPAR) 5 MG tablet Take 5 mg by mouth 2 (two) times daily. 10/06/19   [provider]  Elderberry 575 MG/5ML SYRP Take 30 mLs by mouth every morning.    [provider]  escitalopram (LEXAPRO) 10 MG tablet Take 10 mg by mouth daily. 10/06/19   [provider]  metoCLOPramide (REGLAN) 10 MG tablet Take 1 tablet (10 mg total) by mouth 3 (three) times daily with meals for 3 days. 04/22/21 04/25/21  Vanessa [place], MD  Family History Family History  Problem Relation Age of Onset   Stomach cancer Paternal Grandmother    Breast cancer Maternal Grandmother    Healthy Father    Healthy Mother    Migraines Mother     Social History Social History   Tobacco Use   Smoking status: Never   Smokeless tobacco: Never  Vaping Use   Vaping Use: Never used  Substance Use Topics   Alcohol use: No   Drug use: No     Allergies   Patient has no known allergies.   Review of Systems Review of Systems  Constitutional:  Positive for fatigue and fever (subjective). Negative for chills and diaphoresis.  HENT:  Positive for congestion, rhinorrhea and sinus pressure. Negative for ear pain, sinus pain and sore throat.   Eyes:  Positive for pain.  Negative for photophobia, discharge, redness and visual disturbance.  Respiratory:  Positive for cough. Negative for shortness of breath.   Gastrointestinal:  Negative for abdominal pain, nausea and vomiting.  Genitourinary:  Negative for dysuria, frequency, urgency, vaginal bleeding and vaginal discharge.       +vaginal itching and irritation  Musculoskeletal:  Positive for myalgias. Negative for arthralgias.  Skin:  Negative for rash.  Neurological:  Positive for headaches. Negative for weakness.  Hematological:  Negative for adenopathy.    Physical Exam Triage Vital Signs ED Triage Vitals  Enc Vitals Group     BP 05/15/21 1658 129/87     Pulse Rate 05/15/21 1658 96     Resp 05/15/21 1658 18     Temp 05/15/21 1658 98.3 F (36.8 C)     Temp Source 05/15/21 1658 Oral     SpO2 05/15/21 1658 99 %     Weight 05/15/21 1655 152 lb (68.9 kg)     Height 05/15/21 1655 5\' 7"  (1.702 m)     Head Circumference --      Peak Flow --      Pain Score 05/15/21 1654 10     Pain Loc --      Pain Edu? --      Excl. in [place]? --    No data found.  Updated Vital Signs BP 129/87 (BP Location: Right Arm)    Pulse 96    Temp 98.3 F (36.8 C) (Oral)    Resp 18    Ht 5\' 7"  (1.702 m)    Wt 152 lb (68.9 kg)    LMP 01/23/2021 (Exact Date)    SpO2 99%    BMI 23.81 kg/m   Physical Exam Vitals and nursing note reviewed.  Constitutional:      General: She is not in acute distress.    Appearance: Normal appearance. She is ill-appearing. She is not toxic-appearing.  HENT:     Head: Normocephalic and atraumatic.     Right Ear: Tympanic membrane, ear canal and external ear normal.     Left Ear: Tympanic membrane, ear canal and external ear normal.     Nose: Congestion present.     Mouth/Throat:     Mouth: Mucous membranes are moist.     Pharynx: Oropharynx is clear.  Eyes:     General: No scleral icterus.       Right eye: No discharge.        Left eye: No discharge.     Extraocular Movements:  Extraocular movements intact.     Conjunctiva/sclera: Conjunctivae normal.     Pupils: Pupils are equal, round, and reactive to light.  Cardiovascular:     Rate and Rhythm: Normal rate and regular rhythm.     Heart sounds: Normal heart sounds.  Pulmonary:     Effort: Pulmonary effort is normal. No respiratory distress.     Breath sounds: Normal breath sounds.  Abdominal:     Palpations: Abdomen is soft.     Tenderness: There is no abdominal tenderness.  Musculoskeletal:     Cervical back: Neck supple.  Skin:    General: Skin is dry.  Neurological:     General: No focal deficit present.     Mental Status: She is alert and oriented to person, place, and time. Mental status is at baseline.     Motor: No weakness.     Coordination: Coordination normal.     Gait: Gait normal.  Psychiatric:        Mood and Affect: Mood normal.        Behavior: Behavior normal.        Thought Content: Thought content normal.     UC Treatments / Results  Labs (all labs ordered are listed, but only abnormal results are displayed) Labs Reviewed  WET PREP, GENITAL - Abnormal; Notable for the following components:      Result Value   Yeast Wet Prep HPF POC PRESENT (*)    Clue Cells Wet Prep HPF POC PRESENT (*)    WBC, Wet Prep HPF POC <10 (*)    All other components within normal limits  URINALYSIS, COMPLETE (UACMP) WITH MICROSCOPIC - Abnormal; Notable for the following components:   Bacteria, UA FEW (*)    All other components within normal limits  RESP PANEL BY RT-PCR (FLU A&B, COVID) [id number]    EKG   Radiology No results found.  Procedures Procedures (including critical care time)  Medications Ordered in UC Medications - No data to display  Initial Impression / Assessment and Plan / UC Course  I have reviewed the triage vital signs and the nursing notes.  Pertinent labs & imaging results that were available during my care of the patient were reviewed by me and considered in my  medical decision making (see chart for details).  36 year old pregnant female presenting for multiple plaints including nasal congestion, headache, fatigue and body aches for the past couple days.  Vitals are stable and patient is mildly ill-appearing.  Nontoxic.  Nasal congestion on exam.  PERRLA, no discharge from eye.  Chest clear to auscultation heart regular rate rhythm.  Respiratory panel obtained and all negative.  Discussed results with patient.  Advised her she has a viral upper respiratory infection.  Suggested Flonase, nasal saline and Tylenol.  ED for any severe headaches or vision changes or vomiting.  Patient also complaining of vaginal irritation and itching.  Positive clue cells and yeast on wet prep.  Urinalysis does not show evidence of UA.  Treating patient at this time with MetroGel and miconazole cream.  Advised following up with OB/GYN.   Final Clinical Impressions(s) / UC Diagnoses   Final diagnoses:  Acute vaginitis  Vaginal itching  [redacted] weeks gestation of pregnancy  Acute upper respiratory infection  Nasal congestion  Acute nonintractable headache, unspecified headache type     Discharge Instructions      -You have BV and a yeast infection.  I sent medications to the pharmacy. - Follow-up with your OB/GYN to make sure these infections have cleared after a week. - Your test for COVID and flu are negative.  You have another viral upper  respiratory infection.  Symptoms should get better in 7 to 10 days.  I think your eye pains related to a headache, could be tension type headache or sinus headache.  You can take Tylenol and use Flonase.  Can also take Benadryl or Allegra. -You need to go to the ER for any severe headaches, vision changes, severe eye pain, vomiting.     ED Prescriptions     Medication Sig Dispense Auth. Provider   metroNIDAZOLE (METROGEL VAGINAL) 0.75 % vaginal gel Place 1 Applicatorful vaginally at bedtime for 7 days. 70 g Laurene Footman B, PA-C    miconazole (MICOTIN) 2 % cream Apply 1 application topically 2 (two) times daily for 7 days. 28.35 g Danton Clap, PA-C      PDMP not reviewed this encounter.   Danton Clap, PA-C 05/15/21 1810

## 2021-05-15 NOTE — Discharge Instructions (Signed)
-  You have BV and a yeast infection.  I sent medications to the pharmacy. - Follow-up with your OB/GYN to make sure these infections have cleared after a week. - Your test for COVID and flu are negative.  You have another viral upper respiratory infection.  Symptoms should get better in 7 to 10 days.  I think your eye pains related to a headache, could be tension type headache or sinus headache.  You can take Tylenol and use Flonase.  Can also take Benadryl or Allegra. -You need to go to the ER for any severe headaches, vision changes, severe eye pain, vomiting.

## 2021-05-18 ENCOUNTER — Encounter: Payer: Self-pay | Admitting: *Deleted

## 2021-05-18 ENCOUNTER — Other Ambulatory Visit: Payer: Self-pay

## 2021-05-18 DIAGNOSIS — Z5321 Procedure and treatment not carried out due to patient leaving prior to being seen by health care provider: Secondary | ICD-10-CM | POA: Insufficient documentation

## 2021-05-18 DIAGNOSIS — M549 Dorsalgia, unspecified: Secondary | ICD-10-CM | POA: Diagnosis not present

## 2021-05-18 DIAGNOSIS — O26892 Other specified pregnancy related conditions, second trimester: Secondary | ICD-10-CM | POA: Diagnosis not present

## 2021-05-18 DIAGNOSIS — R109 Unspecified abdominal pain: Secondary | ICD-10-CM | POA: Diagnosis not present

## 2021-05-18 DIAGNOSIS — Z3A16 16 weeks gestation of pregnancy: Secondary | ICD-10-CM | POA: Insufficient documentation

## 2021-05-18 LAB — CBC
HCT: 35 % — ABNORMAL LOW (ref 36.0–46.0)
Hemoglobin: 12 g/dL (ref 12.0–15.0)
MCH: 31.3 pg (ref 26.0–34.0)
MCHC: 34.3 g/dL (ref 30.0–36.0)
MCV: 91.1 fL (ref 80.0–100.0)
Platelets: 282 10*3/uL (ref 150–400)
RBC: 3.84 MIL/uL — ABNORMAL LOW (ref 3.87–5.11)
RDW: 13 % (ref 11.5–15.5)
WBC: 6.5 10*3/uL (ref 4.0–10.5)
nRBC: 0 % (ref 0.0–0.2)

## 2021-05-18 LAB — URINALYSIS, ROUTINE W REFLEX MICROSCOPIC
Bacteria, UA: NONE SEEN
Bilirubin Urine: NEGATIVE
Glucose, UA: NEGATIVE mg/dL
Hgb urine dipstick: NEGATIVE
Ketones, ur: 80 mg/dL — AB
Leukocytes,Ua: NEGATIVE
Nitrite: NEGATIVE
Protein, ur: NEGATIVE mg/dL
Specific Gravity, Urine: 1.02 (ref 1.005–1.030)
pH: 5 (ref 5.0–8.0)

## 2021-05-18 LAB — COMPREHENSIVE METABOLIC PANEL
ALT: 31 U/L (ref 0–44)
AST: 25 U/L (ref 15–41)
Albumin: 3.3 g/dL — ABNORMAL LOW (ref 3.5–5.0)
Alkaline Phosphatase: 41 U/L (ref 38–126)
Anion gap: 9 (ref 5–15)
BUN: 9 mg/dL (ref 6–20)
CO2: 22 mmol/L (ref 22–32)
Calcium: 9.4 mg/dL (ref 8.9–10.3)
Chloride: 104 mmol/L (ref 98–111)
Creatinine, Ser: 0.65 mg/dL (ref 0.44–1.00)
GFR, Estimated: >60 mL/min (ref >60)
Glucose, Bld: 79 mg/dL (ref 70–99)
Potassium: 3.6 mmol/L (ref 3.5–5.1)
Sodium: 135 mmol/L (ref 135–145)
Total Bilirubin: 0.8 mg/dL (ref 0.3–1.2)
Total Protein: 7.4 g/dL (ref 6.5–8.1)

## 2021-05-18 LAB — HCG, QUANTITATIVE, PREGNANCY: hCG, Beta Chain, Quant, S: 132253 m[IU]/mL — ABNORMAL HIGH (ref <5)

## 2021-05-18 NOTE — ED Triage Notes (Signed)
Pt is approx [redacted] weeks pregnant.  Pt has abd pressure and back pain.  Pt states burning with urination.  No vag bleeding.  Pt alert.

## 2021-05-19 ENCOUNTER — Emergency Department
Admission: EM | Admit: 2021-05-19 | Discharge: 2021-05-19 | Disposition: A | Discharge status: Home / Self Care | Payer: BC Managed Care – PPO | Attending: Emergency Medicine | Admitting: Emergency Medicine

## 2021-05-19 NOTE — ED Notes (Signed)
No answer when called several times from lobby 

## 2021-05-27 IMAGING — CT CT ANGIO CHEST
2 of 12 series · 18 of 46 positions shown · IV contrast (APPLIED)
Comparison: Radiograph yesterday.

CLINICAL DATA: Chest pain and shortness of breath. "Covid
long-hauler"

EXAM:
CT ANGIOGRAPHY CHEST WITH CONTRAST
TECHNIQUE: Multidetector CT imaging of the chest was performed using the
standard protocol during bolus administration of intravenous
contrast. Multiplanar CT image reconstructions and MIPs were
obtained to evaluate the vascular anatomy.
CONTRAST:  150mL OMNIPAQUE IOHEXOL 350 MG/ML SOLN, 2 injections of
75 cc Omnipaque 350 due to suboptimal pulmonary artery opacification
on initial injection.

[Series 6: thins · axial · 0.63mm/px · z∈[-744,-579]mm · 10 of 203 slices shown (1 of 2)]
[im 19/203  lung]
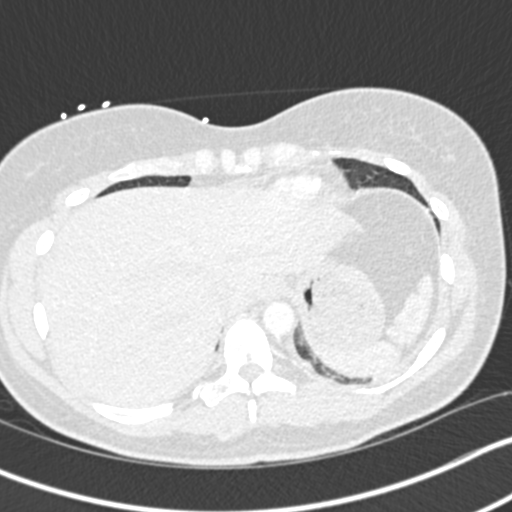
[im 37/203  soft-tissue]
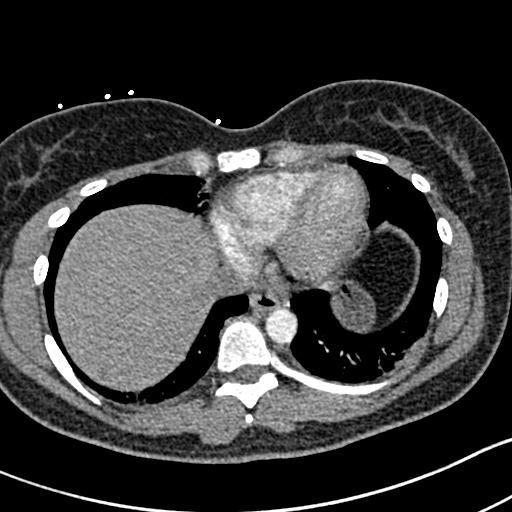
[im 56/203  lung]
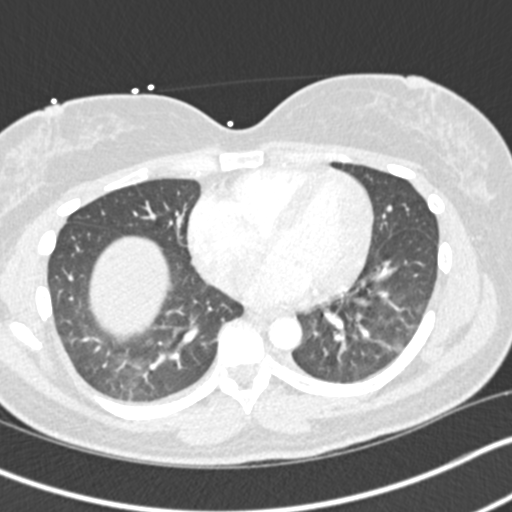
[im 74/203  soft-tissue]
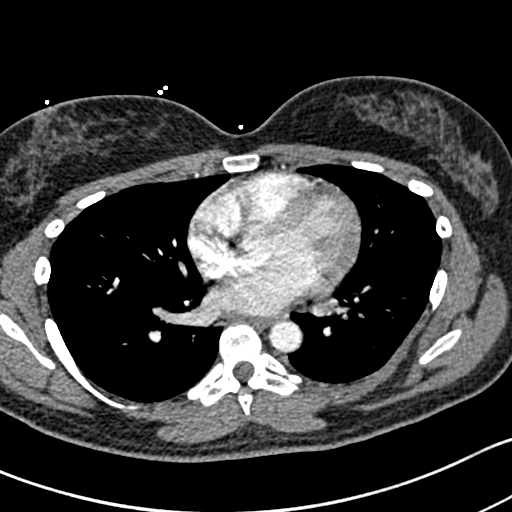
[im 92/203  lung]
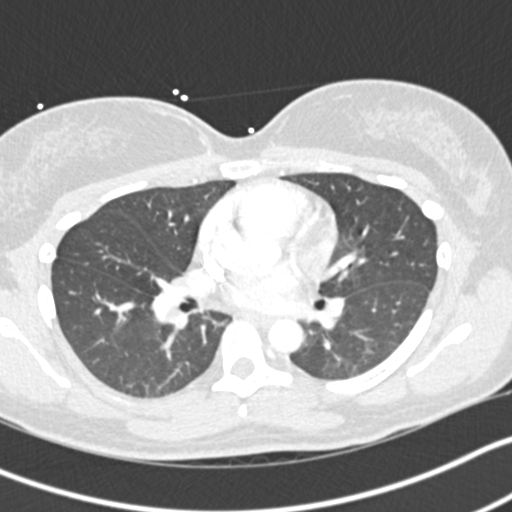
[im 111/203  soft-tissue]
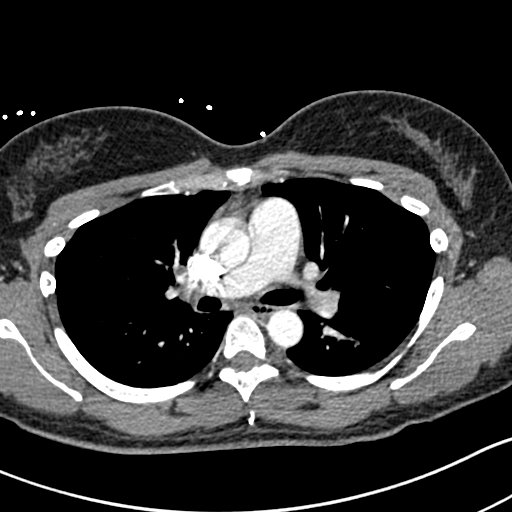
[im 129/203  lung]
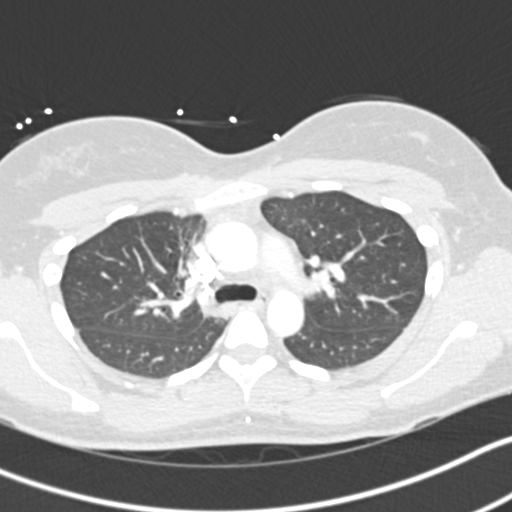
[im 147/203  soft-tissue]
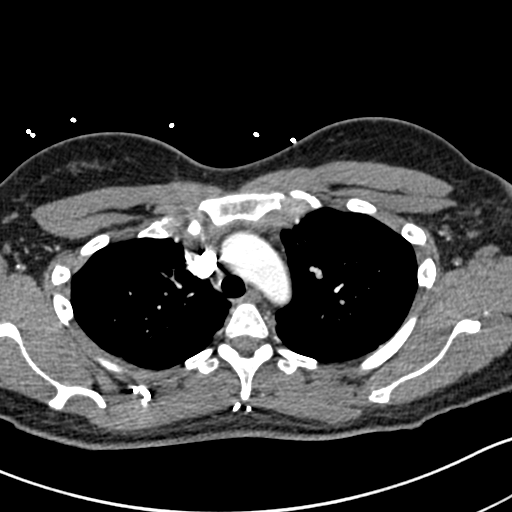
[im 166/203  lung]
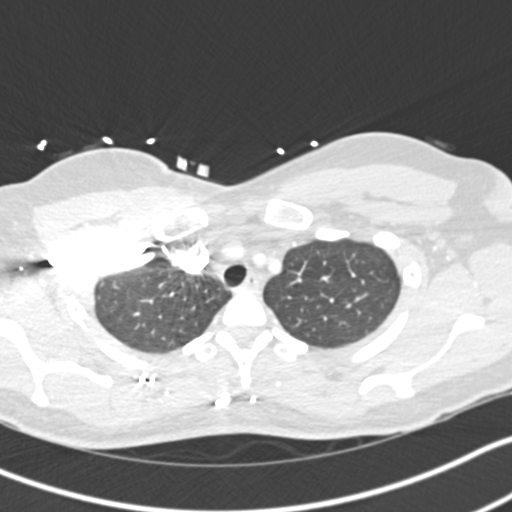
[im 184/203  soft-tissue]
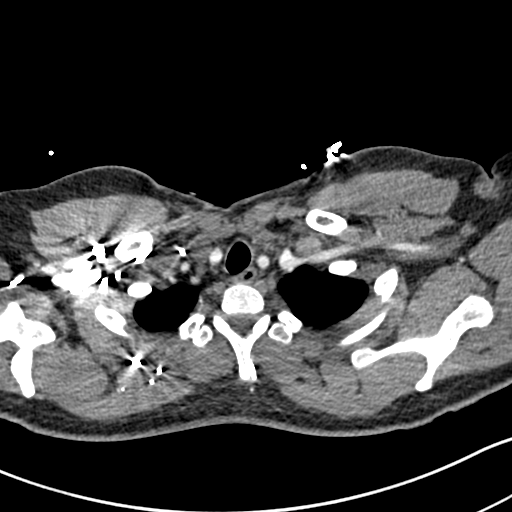

[Series 15: thins · axial · 0.65mm/px · z∈[-752,-581]mm · 8 of 209 slices shown (2 of 2)]
[im 19/209  lung]
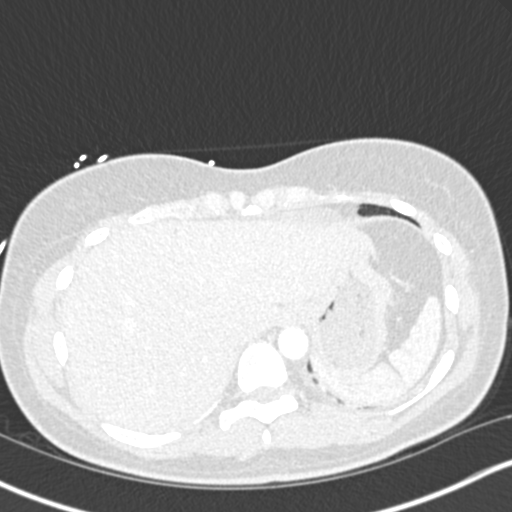
[im 38/209  lung]
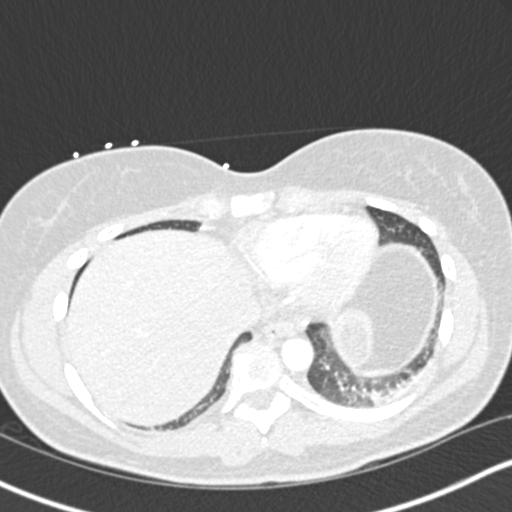
[im 76/209  lung]
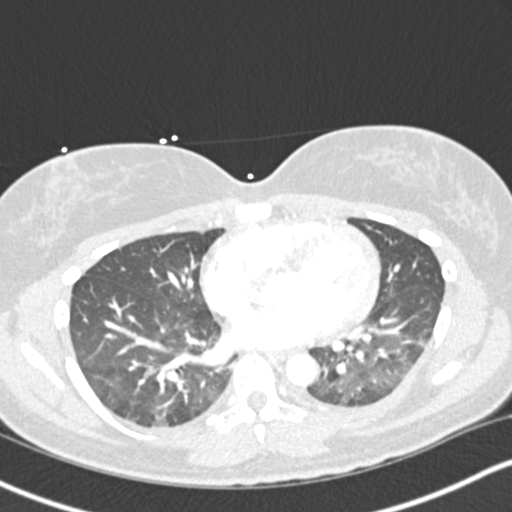
[im 95/209  lung]
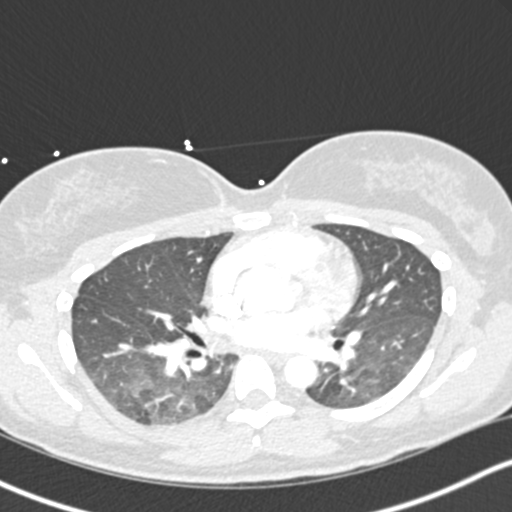
[im 114/209  lung]
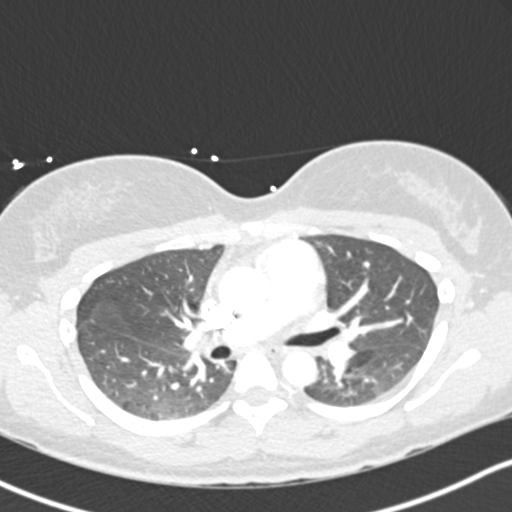
[im 133/209  lung]
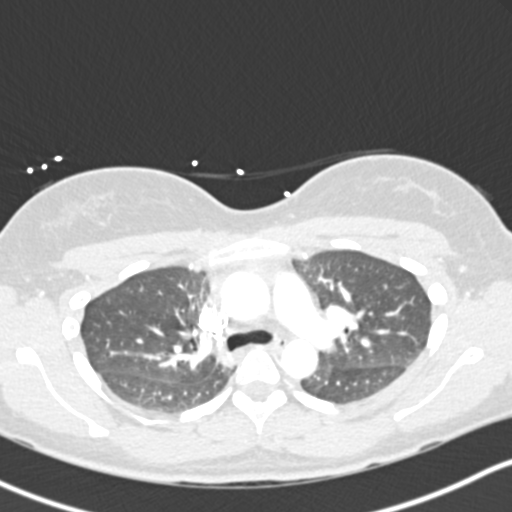
[im 171/209  lung]
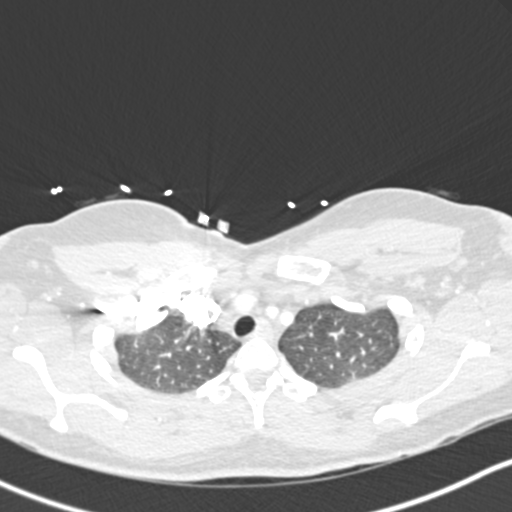
[im 190/209  lung]
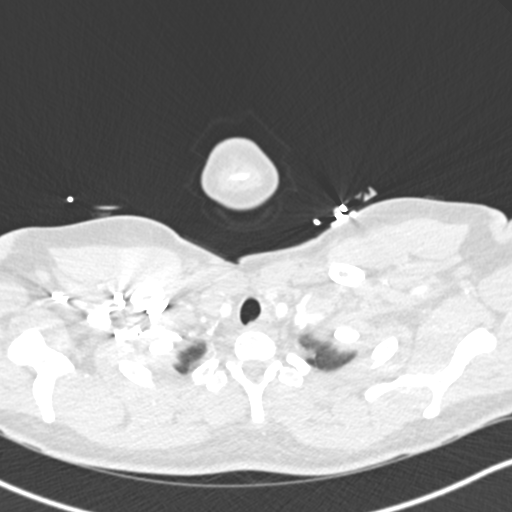

[18 of 46 positions shown; findings below may reference images not displayed]

FINDINGS: Cardiovascular: There are no filling defects within the pulmonary
arteries to suggest pulmonary embolus. The thoracic aorta is normal
in caliber. Conventional branching pattern from the aortic arch. No
aortic dissection. Heart is normal in size. No pericardial effusion.

Mediastinum/Nodes: No enlarged mediastinal and hilar lymph nodes.
Prominent prevascular node measures up to 7 mm. Esophagus is
decompressed. No visualized thyroid nodule.

Lungs/Pleura: Heterogeneous attenuation of pulmonary parenchyma
particularly in the lower lobes with geographic areas of slight
ground-glass density, findings are minimal a initial bolus, and more
pronounced on second IV contrast scan. There a few areas of
ground-glass nodularity in the lower lobes, for example series 16,
image 34. Subsegmental atelectasis in both dependent lower lobes. No
pleural fluid. No evidence of pulmonary edema. Perifissural 6 mm
nodule in the left upper lobe series 16, image 21, likely post
infectious or inflammatory in a patient of this age. No confluent
airspace disease.

Upper Abdomen: No acute abnormality.

Musculoskeletal: Minimal broad-based scoliotic curvature of the
spine9. There are no acute or suspicious osseous abnormalities.

Review of the MIP images confirms the above findings.
IMPRESSION: 1. No pulmonary embolus.
2. Few areas of scattered nodular ground-glass opacities which may
be related to known [id number] infection.
3. Heterogeneous pulmonary parenchyma, seen primarily during second
contrast bolus, likely related to lower lung volumes/hypoventilatory
change, and was not present on initial imaging.
4. Perifissural 6 mm nodule in the left upper lobe is likely post
infectious or inflammatory in a patient of this age. Ablation is
society guidelines do not apply to a patient of this age, and in the
absence of known malignancy, no dedicated imaging follow-up is
needed.

## 2021-05-30 ENCOUNTER — Emergency Department
Admission: EM | Admit: 2021-05-30 | Discharge: 2021-05-30 | Disposition: A | Discharge status: Home / Self Care | Payer: Medicaid Other | Attending: Emergency Medicine | Admitting: Emergency Medicine

## 2021-05-30 ENCOUNTER — Encounter: Payer: Self-pay | Admitting: Emergency Medicine

## 2021-05-30 ENCOUNTER — Other Ambulatory Visit: Payer: Self-pay

## 2021-05-30 DIAGNOSIS — Z79899 Other long term (current) drug therapy: Secondary | ICD-10-CM | POA: Diagnosis not present

## 2021-05-30 DIAGNOSIS — R531 Weakness: Secondary | ICD-10-CM | POA: Diagnosis not present

## 2021-05-30 DIAGNOSIS — O26892 Other specified pregnancy related conditions, second trimester: Secondary | ICD-10-CM | POA: Insufficient documentation

## 2021-05-30 DIAGNOSIS — Z3A18 18 weeks gestation of pregnancy: Secondary | ICD-10-CM | POA: Insufficient documentation

## 2021-05-30 DIAGNOSIS — F419 Anxiety disorder, unspecified: Secondary | ICD-10-CM | POA: Diagnosis not present

## 2021-05-30 DIAGNOSIS — O99342 Other mental disorders complicating pregnancy, second trimester: Secondary | ICD-10-CM | POA: Diagnosis not present

## 2021-05-30 LAB — COMPREHENSIVE METABOLIC PANEL
ALT: 14 U/L (ref 0–44)
AST: 19 U/L (ref 15–41)
Albumin: 3.7 g/dL (ref 3.5–5.0)
Alkaline Phosphatase: 46 U/L (ref 38–126)
Anion gap: 12 (ref 5–15)
BUN: 10 mg/dL (ref 6–20)
CO2: 18 mmol/L — ABNORMAL LOW (ref 22–32)
Calcium: 9.4 mg/dL (ref 8.9–10.3)
Chloride: 105 mmol/L (ref 98–111)
Creatinine, Ser: 0.74 mg/dL (ref 0.44–1.00)
GFR, Estimated: >60 mL/min (ref >60)
Glucose, Bld: 80 mg/dL (ref 70–99)
Potassium: 3.8 mmol/L (ref 3.5–5.1)
Sodium: 135 mmol/L (ref 135–145)
Total Bilirubin: 0.9 mg/dL (ref 0.3–1.2)
Total Protein: 7.9 g/dL (ref 6.5–8.1)

## 2021-05-30 LAB — CBC
HCT: 40.1 % (ref 36.0–46.0)
Hemoglobin: 13 g/dL (ref 12.0–15.0)
MCH: 31.6 pg (ref 26.0–34.0)
MCHC: 32.4 g/dL (ref 30.0–36.0)
MCV: 97.3 fL (ref 80.0–100.0)
Platelets: 264 10*3/uL (ref 150–400)
RBC: 4.12 MIL/uL (ref 3.87–5.11)
RDW: 12.9 % (ref 11.5–15.5)
WBC: 7.2 10*3/uL (ref 4.0–10.5)
nRBC: 0 % (ref 0.0–0.2)

## 2021-05-30 LAB — SALICYLATE LEVEL: Salicylate Lvl: <7 mg/dL — ABNORMAL LOW (ref 7.0–30.0)

## 2021-05-30 LAB — URINALYSIS, ROUTINE W REFLEX MICROSCOPIC
Glucose, UA: NEGATIVE mg/dL
Hgb urine dipstick: NEGATIVE
Ketones, ur: >160 mg/dL — AB
Leukocytes,Ua: NEGATIVE
Nitrite: NEGATIVE
Protein, ur: >300 mg/dL — AB
Specific Gravity, Urine: >1.03 — ABNORMAL HIGH (ref 1.005–1.030)
pH: 5.5 (ref 5.0–8.0)

## 2021-05-30 LAB — URINE DRUG SCREEN, QUALITATIVE (ARMC ONLY)
Amphetamines, Ur Screen: NOT DETECTED
Barbiturates, Ur Screen: NOT DETECTED
Benzodiazepine, Ur Scrn: NOT DETECTED
Cannabinoid 50 Ng, Ur ~~LOC~~: NOT DETECTED
Cocaine Metabolite,Ur ~~LOC~~: NOT DETECTED
MDMA (Ecstasy)Ur Screen: NOT DETECTED
Methadone Scn, Ur: NOT DETECTED
Opiate, Ur Screen: NOT DETECTED
Phencyclidine (PCP) Ur S: NOT DETECTED
Tricyclic, Ur Screen: NOT DETECTED

## 2021-05-30 LAB — URINALYSIS, MICROSCOPIC (REFLEX)
Bacteria, UA: NONE SEEN
RBC / HPF: NONE SEEN RBC/hpf (ref 0–5)
Squamous Epithelial / HPF: >50 (ref 0–5)

## 2021-05-30 LAB — ACETAMINOPHEN LEVEL: Acetaminophen (Tylenol), Serum: <10 ug/mL — ABNORMAL LOW (ref 10–30)

## 2021-05-30 LAB — TSH: TSH: 1.411 u[IU]/mL (ref 0.350–4.500)

## 2021-05-30 LAB — ETHANOL: Alcohol, Ethyl (B): <10 mg/dL (ref <10)
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
## 2021-07-13 ENCOUNTER — Other Ambulatory Visit: Payer: Self-pay

## 2021-07-13 ENCOUNTER — Ambulatory Visit
Admission: EM | Admit: 2021-07-13 | Discharge: 2021-07-13 | Disposition: A | Discharge status: Home / Self Care | Payer: Medicaid Other | Attending: Emergency Medicine | Admitting: Emergency Medicine

## 2021-07-13 DIAGNOSIS — L700 Acne vulgaris: Secondary | ICD-10-CM

## 2021-07-13 MED ORDER — CLINDAMYCIN PHOS-BENZOYL PEROX 1-5 % EX GEL: Freq: Two times a day (BID) | CUTANEOUS | 1 refills | Status: DC

## 2021-07-13 NOTE — ED Triage Notes (Signed)
Pt c/o rash/bumps along chest and upper back x2weeks. Bumps extend down between the breasts and underneath. Pt states that they have grown in size and feel like pimples. The bumps started on the chest and spread to the back ? ?Pt states that they are painful to touch. ? ?Pt is 84months pregnant  ? ?Pt has used OTC neosporin and Vaseline.  ?

## 2021-07-13 NOTE — Discharge Instructions (Addendum)
I am going to treat this as acne.  I am going to hold off on the Keflex for now.  I would like to see if this works.  Use the BenzaClin twice a day.  Please follow-up with your OB/GYN in a week or 2 if this is not getting better. ?

## 2021-07-13 NOTE — ED Provider Notes (Signed)
HPI ? ?SUBJECTIVE: ? ?Jordan [name] is a 36 y.o. female who presents with an intermittently painful, pruritic pustular/papular rash starting on her chest, which has now spread to her back and forehead.  It itches only during the day.  It does not burn.  No fevers, nausea vomiting, body aches, flulike symptoms.  No new lotions, soaps, detergents, change in medications.  No contacts with similar rash, recent antibiotics, exposure to poison ivy, poison oak, pets in the house, sensation of being bitten at night, blood in the bedclothes in the morning, recent travel.  She has tried Neosporin and Vaseline without improvement in her symptoms.  Symptoms are worse with wearing clothing and with palpation.  Past medical history negative for acne and MRSA.  LMP: She is currently 6 months pregnant, states her pregnancy is going well.  She is feeling the baby move.  Denies contractions, vaginal bleeding, leakage of fluid.  She is getting prenatal care.  [facility]  OB: [facility] [place] ? ?Past Medical History:  ?Diagnosis Date  ? Blood transfusion without reported diagnosis   ? COVID-19 09/02/2018  ? Seizures (HCC)   ? ? ?Past Surgical History:  ?Procedure Laterality Date  ? NO PAST SURGERIES    ? ? ?Family History  ?Problem Relation Age of Onset  ? Stomach cancer Paternal Grandmother   ? Breast cancer Maternal Grandmother   ? Healthy Father   ? Healthy Mother   ? Migraines Mother   ? ? ?Social History  ? ?Tobacco Use  ? Smoking status: Never  ? Smokeless tobacco: Never  ?Vaping Use  ? Vaping Use: Never used  ?Substance Use Topics  ? Alcohol use: No  ? Drug use: No  ? ? ?No current facility-administered medications for this encounter. ? ?Current Outpatient Medications:  ?  aspirin EC 81 MG tablet, Take 81 mg by mouth daily. Swallow whole., Disp: , Rfl:  ?  clindamycin-benzoyl peroxide (BENZACLIN) gel, Apply topically 2 (two) times daily. Till symptoms resolve, Disp: 35 g, Rfl: 1 ?  Prenatal Vit-Fe  Fumarate-FA (MULTIVITAMIN-PRENATAL) 27-0.8 MG TABS tablet, Take 1 tablet by mouth daily at 12 noon., Disp: , Rfl:  ?  acetaminophen (TYLENOL) 500 MG tablet, Take 1,000 mg by mouth every 8 (eight) hours as needed for moderate pain., Disp: , Rfl:  ?  budesonide-formoterol (SYMBICORT) 160-4.5 MCG/ACT inhaler, Inhale 2 puffs into the lungs 2 (two) times daily., Disp: , Rfl:  ?  busPIRone (BUSPAR) 5 MG tablet, Take 5 mg by mouth 2 (two) times daily., Disp: , Rfl:  ?  Elderberry 575 MG/5ML SYRP, Take 30 mLs by mouth every morning., Disp: , Rfl:  ?  escitalopram (LEXAPRO) 10 MG tablet, Take 10 mg by mouth daily., Disp: , Rfl:  ?  metoCLOPramide (REGLAN) 10 MG tablet, Take 1 tablet (10 mg total) by mouth 3 (three) times daily with meals for 3 days., Disp: 9 tablet, Rfl: 0 ? ?No Known Allergies ? ? ?ROS ? ?As noted in HPI.  ? ?Physical Exam ? ?BP 120/81 (BP Location: Right Arm)   Pulse 100   Temp 98.2 ?F (36.8 ?C) (Oral)   Resp 18   Ht 5\' 6"  (1.676 m)   Wt 73.5 kg   LMP 01/23/2021 (Exact Date)   SpO2 99%   BMI 26.15 kg/m?  ? ?Constitutional: Well developed, well nourished, no acute distress ?Eyes:  EOMI, conjunctiva normal bilaterally ?HENT: Normocephalic, atraumatic,mucus membranes moist ?Respiratory: Normal inspiratory effort ?Cardiovascular: Normal rate ?GI: nondistended ?  skin: Tender papules, pustules with some hyper pigmented lesions over forehead, chest, back.  Comedones on the back. ? ? ? ? ? ? ? ? ? ? ? ?Musculoskeletal: no deformities ?Neurologic: Alert & oriented x 3, no focal neuro deficits ?Psychiatric: Speech and behavior appropriate ? ? ?ED Course ? ? ?Medications - No data to display ? ?No orders of the defined types were placed in this encounter. ? ? ?No results found for this or any previous visit (from the past 24 hour(s)). ?No results found. ? ?ED Clinical Impression ? ?1. Acne vulgaris   ?  ? ?ED Assessment/Plan ? ?Presentation consistent with acne versus a folliculitis, favor acne.  Will send  home with BenzaClin to apply twice daily daily.  Topical therapy is the preferred treatment of acne during pregnancy per up-to-date.  will hold off on Keflex for now.  Follow-up with OB/GYN if not better in 1-2 weeks ? ?Discussed  MDM, treatment plan, and plan for follow-up with patient.  patient agrees with plan.  ? ?Meds ordered this encounter  ?Medications  ? clindamycin-benzoyl peroxide (BENZACLIN) gel  ?  Sig: Apply topically 2 (two) times daily. Till symptoms resolve  ?  Dispense:  35 g  ?  Refill:  1  ? ? ? ? ?*This clinic note was created using Scientist, clinical (histocompatibility and immunogenetics). Therefore, there may be occasional mistakes despite careful proofreading. ? ?? ? ?  ?Domenick Gong, MD ?07/13/21 1207 ? ?

## 2021-07-18 ENCOUNTER — Telehealth: Payer: Self-pay | Admitting: Emergency Medicine

## 2021-07-18 MED ORDER — CLINDAMYCIN PHOS-BENZOYL PEROX 1-5 % EX GEL: Freq: Two times a day (BID) | CUTANEOUS | 1 refills | Status: DC

## 2021-07-18 NOTE — Telephone Encounter (Signed)
Patient requested Rx be resent to the [facility] on [address] in [place] ?

## 2021-07-30 ENCOUNTER — Encounter (HOSPITAL_COMMUNITY): Payer: Self-pay | Admitting: Internal Medicine

## 2021-07-30 ENCOUNTER — Other Ambulatory Visit: Payer: Self-pay

## 2021-07-30 ENCOUNTER — Inpatient Hospital Stay (HOSPITAL_COMMUNITY)
Admission: AD | Admit: 2021-07-30 | Discharge: 2021-07-30 | Disposition: A | Discharge status: Home / Self Care | Payer: Medicaid Other | Attending: Obstetrics and Gynecology | Admitting: Obstetrics and Gynecology

## 2021-07-30 DIAGNOSIS — R109 Unspecified abdominal pain: Secondary | ICD-10-CM | POA: Diagnosis not present

## 2021-07-30 DIAGNOSIS — O26892 Other specified pregnancy related conditions, second trimester: Secondary | ICD-10-CM | POA: Insufficient documentation

## 2021-07-30 DIAGNOSIS — O99891 Other specified diseases and conditions complicating pregnancy: Secondary | ICD-10-CM | POA: Diagnosis not present

## 2021-07-30 DIAGNOSIS — Z3A26 26 weeks gestation of pregnancy: Secondary | ICD-10-CM | POA: Insufficient documentation

## 2021-07-30 DIAGNOSIS — N858 Other specified noninflammatory disorders of uterus: Secondary | ICD-10-CM | POA: Insufficient documentation

## 2021-07-30 DIAGNOSIS — Z3689 Encounter for other specified antenatal screening: Secondary | ICD-10-CM | POA: Insufficient documentation

## 2021-07-30 DIAGNOSIS — R82998 Other abnormal findings in urine: Secondary | ICD-10-CM | POA: Diagnosis not present

## 2021-07-30 DIAGNOSIS — R102 Pelvic and perineal pain: Secondary | ICD-10-CM | POA: Insufficient documentation

## 2021-07-30 HISTORY — DX: Unspecified asthma, uncomplicated: J45.909

## 2021-07-30 LAB — URINALYSIS, ROUTINE W REFLEX MICROSCOPIC
Bilirubin Urine: NEGATIVE
Glucose, UA: NEGATIVE mg/dL
Hgb urine dipstick: NEGATIVE
Ketones, ur: NEGATIVE mg/dL
Nitrite: NEGATIVE
Protein, ur: NEGATIVE mg/dL
Specific Gravity, Urine: 1.029 (ref 1.005–1.030)
pH: 6 (ref 5.0–8.0)

## 2021-07-30 LAB — FETAL FIBRONECTIN: Fetal Fibronectin: NEGATIVE

## 2021-07-30 MED ORDER — CEFADROXIL 500 MG PO CAPS: 500.0000 mg | ORAL_CAPSULE | Freq: Two times a day (BID) | ORAL | 0 refills | Status: DC

## 2021-07-30 MED ORDER — NIFEDIPINE 10 MG PO CAPS
10.0000 mg | ORAL_CAPSULE | ORAL | Status: DC | PRN
  Administered 2021-07-30 (×3): 10 mg via ORAL
  Filled 2021-07-30 (×3): qty 1

## 2021-07-30 NOTE — MAU Provider Note (Signed)
Chief Complaint:  Abdominal Pain ? ? Event Date/Time  ? First Provider Initiated Contact with Patient 07/30/21 2030   ?  ?HPI: Jordan [name] is a 36 y.o. G1P0000 at 23w2dwho presents to maternity admissions reporting pelvic pressure and cramping today.  States has never had this happen before. Marland Kitchen ?She reports good fetal movement, denies LOF, vaginal bleeding, vaginal itching/burning, urinary symptoms, h/a, dizziness, n/v, diarrhea, constipation or fever/chills.   ? ?Abdominal Pain ?This is a new problem. The current episode started today. The onset quality is gradual. The problem occurs intermittently. The problem has been unchanged. The pain is mild. The quality of the pain is cramping. The abdominal pain does not radiate. Pertinent negatives include no constipation, diarrhea, dysuria, fever, frequency, myalgias, nausea or vomiting. Nothing aggravates the pain. The pain is relieved by Nothing. She has tried nothing for the symptoms.  ? ?RN Note: ?Jordan [name] is a 36 y.o. at [redacted]w[redacted]d here in MAU reporting: vaginal pressure x 2 days, possible contractions-uterine tightening Q 5 minutes-radiating to lower back-that started today. Pt denies leaking of fluid, vaginal discharge or bleeding. Reports baby is very active. ?  ? ?Past Medical History: ?Past Medical History:  ?Diagnosis Date  ? Asthma   ? Blood transfusion without reported diagnosis   ? COVID-19 09/02/2018  ? Seizures (HCC)   ? ? ?Past obstetric history: ?OB History  ?Gravida Para Term Preterm AB Living  ?1 0 0 0 0 0  ?SAB IAB Ectopic Multiple Live Births  ?0 0 0 0 0  ?  ?# Outcome Date GA Lbr Len/2nd Weight Sex Delivery Anes PTL Lv  ?1 Current           ? ? ?Past Surgical History: ?Past Surgical History:  ?Procedure Laterality Date  ? NO PAST SURGERIES    ? ? ?Family History: ?Family History  ?Problem Relation Age of Onset  ? Stomach cancer Paternal Grandmother   ? Breast cancer Maternal Grandmother   ? Healthy Father   ? Healthy Mother   ? Migraines  Mother   ? ? ?Social History: ?Social History  ? ?Tobacco Use  ? Smoking status: Never  ? Smokeless tobacco: Never  ?Vaping Use  ? Vaping Use: Never used  ?Substance Use Topics  ? Alcohol use: No  ? Drug use: No  ? ? ?Allergies: No Known Allergies ? ?Meds:  ?Medications Prior to Admission  ?Medication Sig Dispense Refill Last Dose  ? aspirin EC 81 MG tablet Take 81 mg by mouth daily. Swallow whole.   07/30/2021  ? Prenatal Vit-Fe Fumarate-FA (MULTIVITAMIN-PRENATAL) 27-0.8 MG TABS tablet Take 1 tablet by mouth daily at 12 noon.   07/30/2021  ? acetaminophen (TYLENOL) 500 MG tablet Take 1,000 mg by mouth every 8 (eight) hours as needed for moderate pain.     ? budesonide-formoterol (SYMBICORT) 160-4.5 MCG/ACT inhaler Inhale 2 puffs into the lungs 2 (two) times daily.     ? busPIRone (BUSPAR) 5 MG tablet Take 5 mg by mouth 2 (two) times daily.     ? clindamycin-benzoyl peroxide (BENZACLIN) gel Apply topically 2 (two) times daily. Till symptoms resolve 35 g 1   ? Elderberry 575 MG/5ML SYRP Take 30 mLs by mouth every morning.     ? escitalopram (LEXAPRO) 10 MG tablet Take 10 mg by mouth daily.     ? metoCLOPramide (REGLAN) 10 MG tablet Take 1 tablet (10 mg total) by mouth 3 (three) times daily with meals for 3 days. 9 tablet 0   ? ? ?  I have reviewed patient's Past Medical Hx, Surgical Hx, Family Hx, Social Hx, medications and allergies.  ? ?ROS:  ?Review of Systems  ?Constitutional:  Negative for fever.  ?Gastrointestinal:  Positive for abdominal pain. Negative for constipation, diarrhea, nausea and vomiting.  ?Genitourinary:  Negative for dysuria and frequency.  ?Musculoskeletal:  Negative for myalgias.  ?Other systems negative ? ?Physical Exam  ?Patient Vitals for the past 24 hrs: ? BP Temp Temp src Pulse Resp SpO2 Height Weight  ?07/30/21 2010 107/74 98.7 ?F (37.1 ?C) Oral (!) 123 18 99 % 5\' 6"  (1.676 m) 74.3 kg  ? ?Constitutional: Well-developed, well-nourished female in no acute distress.  ?Cardiovascular: normal rate  and rhythm ?Respiratory: normal effort, clear to auscultation bilaterally ?GI: Abd soft, non-tender, gravid appropriate for gestational age.   No rebound or guarding. ?MS: Extremities nontender, no edema, normal ROM ?Neurologic: Alert and oriented x 4.  ?GU: Neg CVAT. ? ?PELVIC EXAM: Dilation: Closed ?Effacement (%): Thick ?Station: Ballotable ?Exam by:: [email or web address], CNM ? ? FHT:  Baseline 140 , moderate variability, accelerations present, no decelerations ?Contractions: Uterine irritability ?  ?Labs: ?Results for orders placed or performed during the hospital encounter of 07/30/21 (from the past 24 hour(s))  ?Urinalysis, Routine w reflex microscopic Urine, Clean Catch     Status: Abnormal  ? Collection Time: 07/30/21  8:28 PM  ?Result Value Ref Range  ? Color, Urine YELLOW YELLOW  ? APPearance HAZY (A) CLEAR  ? Specific Gravity, Urine 1.029 1.005 - 1.030  ? pH 6.0 5.0 - 8.0  ? Glucose, UA NEGATIVE NEGATIVE mg/dL  ? Hgb urine dipstick NEGATIVE NEGATIVE  ? Bilirubin Urine NEGATIVE NEGATIVE  ? Ketones, ur NEGATIVE NEGATIVE mg/dL  ? Protein, ur NEGATIVE NEGATIVE mg/dL  ? Nitrite NEGATIVE NEGATIVE  ? Leukocytes,Ua TRACE (A) NEGATIVE  ? RBC / HPF 0-5 0 - 5 RBC/hpf  ? WBC, UA 0-5 0 - 5 WBC/hpf  ? Bacteria, UA RARE (A) NONE SEEN  ? Squamous Epithelial / LPF 6-10 0 - 5  ? Mucus PRESENT   ?Fetal fibronectin     Status: None  ? Collection Time: 07/30/21  8:40 PM  ?Result Value Ref Range  ? Fetal Fibronectin NEGATIVE NEGATIVE  ? ? ?--/--/A POS ?Performed at [facility], [address] [place], [place] ? (11/06 1410) ? ?Imaging:  ?No results found. ? ?MAU Course/MDM: ?I have ordered labs and reviewed results. UA showed trace leuks, sent to culture, will treat presumptively due to persistent irritability ?NST reviewed, reactive with a single variable decel at beginning of tracing ?Consult Dr [id number] with presentation, exam findings and test results. She states if patient is feeling less pain, can be  discharged home ?Dr Para March notified to arrange followup in office ?Treatments in MAU included Procardia x 3 doses.   ? ?Assessment: ?Single IUP at. [redacted]w[redacted]d ?Uterine irritability, preterm ?No cervical change and negative FFn ?Trace leukocytes in urine, possible UTI ? ?Plan: ?Discharge home ?Preterm Labor precautions and fetal kick counts ?Rx Duricef for presumed UTI, urine to culture ?Follow up in Office for prenatal visits  ?Encouraged to return if she develops worsening of symptoms, increase in pain, fever, or other concerning symptoms. ? ?Pt stable at time of discharge. ? ?[redacted]w[redacted]d CNM, MSN ?Certified Nurse-Midwife ?07/30/2021 ?8:31 PM ?

## 2021-07-30 NOTE — MAU Note (Signed)
Jordan [name] is a 36 y.o. at [redacted]w[redacted]d here in MAU reporting: vaginal pressure x 2 days, possible contractions-uterine tightening Q 5 minutes-radiating to lower back-that started today. Pt denies leaking of fluid, vaginal discharge or bleeding. Reports baby is very active. ? ? ?Pain score: 8 cramping ?                   10 vaginal pressure ?Vitals:  ? 07/30/21 2010  ?BP: 107/74  ?Pulse: (!) 123  ?Resp: 18  ?Temp: 98.7 ?F (37.1 ?C)  ?SpO2: 99%  ?   ?FHT:156bpm ?Lab orders placed from triage:  UA ?

## 2021-08-01 LAB — CULTURE, OB URINE

## 2021-08-27 ENCOUNTER — Encounter (HOSPITAL_COMMUNITY): Payer: Medicaid Other

## 2021-08-27 ENCOUNTER — Encounter (HOSPITAL_COMMUNITY): Payer: Self-pay | Admitting: Obstetrics and Gynecology

## 2021-08-27 ENCOUNTER — Inpatient Hospital Stay (HOSPITAL_BASED_OUTPATIENT_CLINIC_OR_DEPARTMENT_OTHER): Payer: Medicaid Other

## 2021-08-27 ENCOUNTER — Inpatient Hospital Stay (HOSPITAL_COMMUNITY)
Admission: AD | Admit: 2021-08-27 | Discharge: 2021-08-27 | Disposition: A | Discharge status: Home / Self Care | Payer: Medicaid Other | Attending: Obstetrics and Gynecology | Admitting: Obstetrics and Gynecology

## 2021-08-27 ENCOUNTER — Inpatient Hospital Stay (HOSPITAL_COMMUNITY): Payer: Medicaid Other

## 2021-08-27 DIAGNOSIS — N898 Other specified noninflammatory disorders of vagina: Secondary | ICD-10-CM | POA: Diagnosis not present

## 2021-08-27 DIAGNOSIS — R079 Chest pain, unspecified: Secondary | ICD-10-CM

## 2021-08-27 DIAGNOSIS — R0609 Other forms of dyspnea: Secondary | ICD-10-CM | POA: Diagnosis not present

## 2021-08-27 DIAGNOSIS — O479 False labor, unspecified: Secondary | ICD-10-CM

## 2021-08-27 DIAGNOSIS — O26893 Other specified pregnancy related conditions, third trimester: Secondary | ICD-10-CM | POA: Insufficient documentation

## 2021-08-27 DIAGNOSIS — Z3A30 30 weeks gestation of pregnancy: Secondary | ICD-10-CM | POA: Diagnosis not present

## 2021-08-27 DIAGNOSIS — O99413 Diseases of the circulatory system complicating pregnancy, third trimester: Secondary | ICD-10-CM | POA: Diagnosis not present

## 2021-08-27 DIAGNOSIS — F410 Panic disorder [episodic paroxysmal anxiety] without agoraphobia: Secondary | ICD-10-CM | POA: Insufficient documentation

## 2021-08-27 DIAGNOSIS — R000 Tachycardia, unspecified: Secondary | ICD-10-CM | POA: Diagnosis not present

## 2021-08-27 DIAGNOSIS — F430 Acute stress reaction: Secondary | ICD-10-CM

## 2021-08-27 DIAGNOSIS — O99891 Other specified diseases and conditions complicating pregnancy: Secondary | ICD-10-CM

## 2021-08-27 DIAGNOSIS — O4703 False labor before 37 completed weeks of gestation, third trimester: Secondary | ICD-10-CM | POA: Insufficient documentation

## 2021-08-27 DIAGNOSIS — Z8616 Personal history of COVID-19: Secondary | ICD-10-CM

## 2021-08-27 HISTORY — DX: Post covid-19 condition, unspecified: U09.9

## 2021-08-27 HISTORY — DX: Other forms of dyspnea: R06.09

## 2021-08-27 LAB — ECHOCARDIOGRAM COMPLETE
AR max vel: 2.47 cm2
AV Area VTI: 2.2 cm2
AV Area mean vel: 2.34 cm2
AV Mean grad: 3 mmHg
AV Peak grad: 5.5 mmHg
Ao pk vel: 1.17 m/s
S' Lateral: 2.2 cm

## 2021-08-27 LAB — CBC WITH DIFFERENTIAL/PLATELET
Abs Immature Granulocytes: 0.09 10*3/uL — ABNORMAL HIGH (ref 0.00–0.07)
Basophils Absolute: 0 10*3/uL (ref 0.0–0.1)
Basophils Relative: 0 %
Eosinophils Absolute: 0.1 10*3/uL (ref 0.0–0.5)
Eosinophils Relative: 1 %
HCT: 31.3 % — ABNORMAL LOW (ref 36.0–46.0)
Hemoglobin: 10.2 g/dL — ABNORMAL LOW (ref 12.0–15.0)
Immature Granulocytes: 1 %
Lymphocytes Relative: 18 %
Lymphs Abs: 1.4 10*3/uL (ref 0.7–4.0)
MCH: 28.8 pg (ref 26.0–34.0)
MCHC: 32.6 g/dL (ref 30.0–36.0)
MCV: 88.4 fL (ref 80.0–100.0)
Monocytes Absolute: 0.8 10*3/uL (ref 0.1–1.0)
Monocytes Relative: 10 %
Neutro Abs: 5.2 10*3/uL (ref 1.7–7.7)
Neutrophils Relative %: 70 %
Platelets: 219 10*3/uL (ref 150–400)
RBC: 3.54 MIL/uL — ABNORMAL LOW (ref 3.87–5.11)
RDW: 14.5 % (ref 11.5–15.5)
WBC: 7.5 10*3/uL (ref 4.0–10.5)
nRBC: 0 % (ref 0.0–0.2)

## 2021-08-27 LAB — COMPREHENSIVE METABOLIC PANEL
ALT: 14 U/L (ref 0–44)
AST: 21 U/L (ref 15–41)
Albumin: 2.9 g/dL — ABNORMAL LOW (ref 3.5–5.0)
Alkaline Phosphatase: 63 U/L (ref 38–126)
Anion gap: 8 (ref 5–15)
BUN: 7 mg/dL (ref 6–20)
CO2: 22 mmol/L (ref 22–32)
Calcium: 9.2 mg/dL (ref 8.9–10.3)
Chloride: 106 mmol/L (ref 98–111)
Creatinine, Ser: 0.79 mg/dL (ref 0.44–1.00)
GFR, Estimated: >60 mL/min (ref >60)
Glucose, Bld: 105 mg/dL — ABNORMAL HIGH (ref 70–99)
Potassium: 3.8 mmol/L (ref 3.5–5.1)
Sodium: 136 mmol/L (ref 135–145)
Total Bilirubin: 0.7 mg/dL (ref 0.3–1.2)
Total Protein: 6.5 g/dL (ref 6.5–8.1)

## 2021-08-27 LAB — URINALYSIS, ROUTINE W REFLEX MICROSCOPIC
Bilirubin Urine: NEGATIVE
Glucose, UA: NEGATIVE mg/dL
Hgb urine dipstick: NEGATIVE
Ketones, ur: NEGATIVE mg/dL
Leukocytes,Ua: NEGATIVE
Nitrite: NEGATIVE
Protein, ur: NEGATIVE mg/dL
Specific Gravity, Urine: 1.018 (ref 1.005–1.030)
pH: 6 (ref 5.0–8.0)

## 2021-08-27 LAB — TROPONIN I (HIGH SENSITIVITY): Troponin I (High Sensitivity): 3 ng/L (ref <18)

## 2021-08-27 LAB — MAGNESIUM: Magnesium: 1.7 mg/dL (ref 1.7–2.4)

## 2021-08-27 LAB — D-DIMER, QUANTITATIVE: D-Dimer, Quant: 1.18 ug/mL-FEU — ABNORMAL HIGH (ref 0.00–0.50)

## 2021-08-27 MED ORDER — HYDROXYZINE HCL 50 MG PO TABS
50.0000 mg | ORAL_TABLET | Freq: Once | ORAL | Status: AC
  Administered 2021-08-27: 50 mg via ORAL
  Filled 2021-08-27: qty 1

## 2021-08-27 MED ORDER — HYDROXYZINE PAMOATE 25 MG PO CAPS: 25.0000 mg | ORAL_CAPSULE | Freq: Four times a day (QID) | ORAL | 3 refills | Status: DC | PRN

## 2021-08-27 MED ORDER — ENOXAPARIN SODIUM 80 MG/0.8ML IJ SOSY
80.0000 mg | PREFILLED_SYRINGE | Freq: Once | INTRAMUSCULAR | Status: AC
  Administered 2021-08-27: 80 mg via SUBCUTANEOUS
  Filled 2021-08-27: qty 0.8

## 2021-08-27 MED ORDER — SODIUM CHLORIDE 0.9 % BOLUS PEDS
500.0000 mL | Freq: Once | INTRAVENOUS | Status: AC
  Administered 2021-08-27: 500 mL via INTRAVENOUS

## 2021-08-27 NOTE — Significant Event (Signed)
Rapid Response Event Note  ? ?Reason for Call :  ?Chest pain and shortness of breath ? ?Initial Focused Assessment:  ?This patient presented to the MAU with CO CP and SOB. This patient is currently [redacted] weeks pregnant. Patient reports 10/10 chest pain with no relieving factors. She denies pain on inspiration although she has positive homen's sign. She had previously had Covid-19 and suffered from bilateral pneumonia. She reported similar symptoms to when she had covid.  ?She denies any history of estrogen OCP, smoking, or any recent long travel. ? ? ? ? ? ?Interventions:  ?12-lead EKG- findings showed Sinus tach with findings of negative prominent S wave in 1, positive inverted T wave in III,  inverted T-waves in  V1-V3 only, and no ST segment elevation in aVR and v1. ? ?D-dimer ?CBC ?CMET ?2D Echo ?LE DVT US ?CT PE study ?80 mg of SQ lovenox administered ?Fetal heart tones monitored.  ? ?Plan of Care:  ?Awaiting labs and imaging for this patient.  ? ? ?Event Summary:  ? ?MD Notified: NMW [place] bedside ?Call Time: 1239 ?Arrival Time: 1242 ?End Time: 1340 ? ?Andrey Spearman, RN ?

## 2021-08-27 NOTE — Progress Notes (Signed)
VASCULAR LAB ? ? ? ?Right lower extremity venous duplex has been performed. ? ?See CV proc for preliminary results. ? ?Messaged results to [place], CNM via secure chat ? ?Jersey Espinoza, RVT ?08/27/2021, 3:31 PM ? ?

## 2021-08-27 NOTE — MAU Provider Note (Signed)
Chief Complaint:  Contractions and Chest Pain ? ? Event Date/Time  ? First Provider Initiated Contact with Patient 08/27/21 1242   ?  ?HPI: Jordan [name] is a 36 y.o. G1P0000 at [redacted]w[redacted]d who presents to maternity admissions contractions this morning and dizziness but upon arrival she developed chest pain shortness of breath and tachycardia.  RN called CNM immediately to room.  Patient tachycardic to 140s, in moderate distress, tachypneic.  O2 sats 97% - 99% on room air. ? ?Patient had COVID-19 in May 2020 and since then has had numerous ED visits and hospitalizations for shortness of breath and tachycardia. Per admission at [facility] in 2021 pulmopnary issues likely due to scaring from prior covid infection. Pt says this is similar but has never had chest pain like this with his other episodes. ? ?Location: Substernal ?Quality: Pain and pressure ?Severity: 9/10 in pain scale ?Duration: Since arrival to MAU ?Context: [redacted] weeks gestation.  Likely long COVID-related chronic shortness of breath. ?Timing: Constant ?Modifying factors: Nothing.  Has not tried anything for the pain. ?Associated signs and symptoms: Negative for fever, chills, cough, congestion,, vaginal bleeding, leaking of fluid.  Positive for dizziness, shortness of breath. Good fetal movement.  ? ?Pregnancy Course: Gets care at [place] ? ?Past Medical History:  ?Diagnosis Date  ? Asthma   ? Blood transfusion without reported diagnosis   ? COVID-19 09/02/2018  ? Seizures ([place])   ? ?OB History  ?Gravida Para Term Preterm AB Living  ?1 0 0 0 0 0  ?SAB IAB Ectopic Multiple Live Births  ?0 0 0 0 0  ?  ?# Outcome Date GA Lbr Len/2nd Weight Sex Delivery Anes PTL Lv  ?1 Current           ? ?Past Surgical History:  ?Procedure Laterality Date  ? NO PAST SURGERIES    ? ?Family History  ?Problem Relation Age of Onset  ? Stomach cancer Paternal Grandmother   ? Breast cancer Maternal Grandmother   ? Healthy Father   ? Healthy Mother   ? Migraines Mother    ? ?Social History  ? ?Tobacco Use  ? Smoking status: Never  ? Smokeless tobacco: Never  ?Vaping Use  ? Vaping Use: Never used  ?Substance Use Topics  ? Alcohol use: No  ? Drug use: No  ? ?No Known Allergies ?Medications Prior to Admission  ?Medication Sig Dispense Refill Last Dose  ? acetaminophen (TYLENOL) 500 MG tablet Take 1,000 mg by mouth every 8 (eight) hours as needed for moderate pain.     ? aspirin EC 81 MG tablet Take 81 mg by mouth daily. Swallow whole.     ? budesonide-formoterol (SYMBICORT) 160-4.5 MCG/ACT inhaler Inhale 2 puffs into the lungs 2 (two) times daily.     ? busPIRone (BUSPAR) 5 MG tablet Take 5 mg by mouth 2 (two) times daily.     ? cefadroxil (DURICEF) 500 MG capsule Take 1 capsule (500 mg total) by mouth 2 (two) times daily. 14 capsule 0   ? clindamycin-benzoyl peroxide (BENZACLIN) gel Apply topically 2 (two) times daily. Till symptoms resolve 35 g 1   ? Elderberry 575 MG/5ML SYRP Take 30 mLs by mouth every morning.     ? escitalopram (LEXAPRO) 10 MG tablet Take 10 mg by mouth daily.     ? metoCLOPramide (REGLAN) 10 MG tablet Take 1 tablet (10 mg total) by mouth 3 (three) times daily with meals for 3 days. 9 tablet 0   ?  Prenatal Vit-Fe Fumarate-FA (MULTIVITAMIN-PRENATAL) 27-0.8 MG TABS tablet Take 1 tablet by mouth daily at 12 noon.     ? ? ?I have reviewed patient's Past Medical Hx, Surgical Hx, Family Hx, Social Hx, medications and allergies.  ? ?ROS:  ?Review of Systems  ?Constitutional:  Negative for chills and fever.  ?HENT:  Negative for congestion.   ?Respiratory:  Positive for shortness of breath. Negative for cough, chest tightness and wheezing.   ?Cardiovascular:  Positive for chest pain and leg swelling (Right calf). Negative for palpitations.  ?Gastrointestinal:  Positive for abdominal pain (Mild contractions that resolved spontaneously upon arrival to MAU.).  ?Genitourinary:  Positive for vaginal discharge. Negative for vaginal bleeding.  ?Neurological:  Positive for  light-headedness. Negative for headaches.  ?Psychiatric/Behavioral:  The patient is nervous/anxious.   ? ?Physical Exam  ?Patient Vitals for the past 24 hrs: ? BP Temp Temp src Pulse Resp SpO2  ?08/27/21 1622 118/72 -- -- (!) 107 -- --  ?08/27/21 1620 118/72 98.1 ?F (36.7 ?C) Oral 100 14 100 %  ?08/27/21 1300 120/83 98.3 ?F (36.8 ?C) -- (!) 116 15 97 %  ? ?Constitutional: Well-developed, well-nourished female in moderate distress.  ?Cardiovascular: Tachycardic.  145 upon arrival. ?Respiratory: Tachypneic upon arrival.  Decreased to 14. ?GI: Abd soft, non-tender, gravid appropriate for gestational age. ?MS: Right calf tender, positive Homans.  No visible edema bilaterally normal exam of left lower extremity.  Normal ROM ?Neurologic: Alert and oriented x 4.  ?GU: Declined ? ?FHT:  Baseline 145, moderate variability, accelerations present, no decelerations ?Contractions: Uterine irritability ?  ?Labs: ?Results for orders placed or performed during the hospital encounter of 08/27/21 (from the past 24 hour(s))  ?Urinalysis, Routine w reflex microscopic Urine, Clean Catch     Status: None  ? Collection Time: 08/27/21 12:17 PM  ?Result Value Ref Range  ? Color, Urine YELLOW YELLOW  ? APPearance CLEAR CLEAR  ? Specific Gravity, Urine 1.018 1.005 - 1.030  ? pH 6.0 5.0 - 8.0  ? Glucose, UA NEGATIVE NEGATIVE mg/dL  ? Hgb urine dipstick NEGATIVE NEGATIVE  ? Bilirubin Urine NEGATIVE NEGATIVE  ? Ketones, ur NEGATIVE NEGATIVE mg/dL  ? Protein, ur NEGATIVE NEGATIVE mg/dL  ? Nitrite NEGATIVE NEGATIVE  ? Leukocytes,Ua NEGATIVE NEGATIVE  ?CBC with Differential/Platelet     Status: Abnormal  ? Collection Time: 08/27/21 12:49 PM  ?Result Value Ref Range  ? WBC 7.5 4.0 - 10.5 K/uL  ? RBC 3.54 (L) 3.87 - 5.11 MIL/uL  ? Hemoglobin 10.2 (L) 12.0 - 15.0 g/dL  ? HCT 31.3 (L) 36.0 - 46.0 %  ? MCV 88.4 80.0 - 100.0 fL  ? MCH 28.8 26.0 - 34.0 pg  ? MCHC 32.6 30.0 - 36.0 g/dL  ? RDW 14.5 11.5 - 15.5 %  ? Platelets 219 150 - 400 K/uL  ? nRBC 0.0  0.0 - 0.2 %  ? Neutrophils Relative % 70 %  ? Neutro Abs 5.2 1.7 - 7.7 K/uL  ? Lymphocytes Relative 18 %  ? Lymphs Abs 1.4 0.7 - 4.0 K/uL  ? Monocytes Relative 10 %  ? Monocytes Absolute 0.8 0.1 - 1.0 K/uL  ? Eosinophils Relative 1 %  ? Eosinophils Absolute 0.1 0.0 - 0.5 K/uL  ? Basophils Relative 0 %  ? Basophils Absolute 0.0 0.0 - 0.1 K/uL  ? Immature Granulocytes 1 %  ? Abs Immature Granulocytes 0.09 (H) 0.00 - 0.07 K/uL  ?Comprehensive metabolic panel     Status: Abnormal  ? Collection  Time: 08/27/21 12:49 PM  ?Result Value Ref Range  ? Sodium 136 135 - 145 mmol/L  ? Potassium 3.8 3.5 - 5.1 mmol/L  ? Chloride 106 98 - 111 mmol/L  ? CO2 22 22 - 32 mmol/L  ? Glucose, Bld 105 (H) 70 - 99 mg/dL  ? BUN 7 6 - 20 mg/dL  ? Creatinine, Ser 0.79 0.44 - 1.00 mg/dL  ? Calcium 9.2 8.9 - 10.3 mg/dL  ? Total Protein 6.5 6.5 - 8.1 g/dL  ? Albumin 2.9 (L) 3.5 - 5.0 g/dL  ? AST 21 15 - 41 U/L  ? ALT 14 0 - 44 U/L  ? Alkaline Phosphatase 63 38 - 126 U/L  ? Total Bilirubin 0.7 0.3 - 1.2 mg/dL  ? GFR, Estimated >60 >60 mL/min  ? Anion gap 8 5 - 15  ?Troponin I (High Sensitivity)     Status: None  ? Collection Time: 08/27/21 12:49 PM  ?Result Value Ref Range  ? Troponin I (High Sensitivity) 3 <18 ng/L  ?Magnesium     Status: None  ? Collection Time: 08/27/21 12:49 PM  ?Result Value Ref Range  ? Magnesium 1.7 1.7 - 2.4 mg/dL  ?D-dimer, quantitative     Status: Abnormal  ? Collection Time: 08/27/21  1:25 PM  ?Result Value Ref Range  ? D-Dimer, Quant 1.18 (H) 0.00 - 0.50 ug/mL-FEU  ? ? ?Imaging:  ?ECHOCARDIOGRAM COMPLETE ? ?Result Date: 08/27/2021 ?   ECHOCARDIOGRAM REPORT   Patient Name:   ZORAYA FOUSE Date of Exam: 08/27/2021 Medical Rec #:  [id number]        Height:       66.0 in Accession #:    [id number]       Weight:       163.9 lb Date of Birth:  09/03/1985        BSA:          1.838 m? Patient Age:    46 years         BP:           120/83 mmHg Patient Gender: F                HR:           103 bpm. Exam Location:  Inpatient  Procedure: 2D Echo, Cardiac Doppler and Color Doppler STAT ECHO Indications:    Chest pain  History:        Patient has prior history of Echocardiogram examinations, most                 recent 10/01/2019. Arrythmias:Tach

## 2021-08-27 NOTE — MAU Note (Addendum)
Pt reports to mau with c/o ctx for the past hour q4-5 min.  Pt also reports feeling dizzy and having chest pain for the past hour.  Pt denies LOF or vag bleeding.  +FM.  During triage pt reports feeling some numbness on right side. CNM at bedside, changed pt position and numbness resolved.   ? B/P : 120/83 ?HR 145 ?FHR 130 ?

## 2021-08-27 NOTE — Progress Notes (Signed)
?  Echocardiogram ?2D Echocardiogram has been performed. ? ?Jordan [name] ?08/27/2021, 2:04 PM ?
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
## 2021-08-29 ENCOUNTER — Observation Stay
Admission: EM | Admit: 2021-08-29 | Discharge: 2021-08-29 | Disposition: A | Discharge status: Home / Self Care | Payer: Medicaid Other

## 2021-08-29 DIAGNOSIS — Z8616 Personal history of COVID-19: Secondary | ICD-10-CM | POA: Diagnosis not present

## 2021-08-29 DIAGNOSIS — Z7982 Long term (current) use of aspirin: Secondary | ICD-10-CM | POA: Diagnosis not present

## 2021-08-29 DIAGNOSIS — O368130 Decreased fetal movements, third trimester, not applicable or unspecified: Secondary | ICD-10-CM | POA: Diagnosis not present

## 2021-08-29 DIAGNOSIS — J45909 Unspecified asthma, uncomplicated: Secondary | ICD-10-CM | POA: Diagnosis not present

## 2021-08-29 DIAGNOSIS — O99513 Diseases of the respiratory system complicating pregnancy, third trimester: Secondary | ICD-10-CM | POA: Insufficient documentation

## 2021-08-29 DIAGNOSIS — Z3A30 30 weeks gestation of pregnancy: Secondary | ICD-10-CM | POA: Insufficient documentation

## 2021-08-29 MED ORDER — PRENATAL MULTIVITAMIN CH: 1.0000 | ORAL_TABLET | Freq: Every day | ORAL | Status: DC

## 2021-08-29 MED ORDER — CALCIUM CARBONATE ANTACID 500 MG PO CHEW: 2.0000 | CHEWABLE_TABLET | ORAL | Status: DC | PRN

## 2021-08-29 MED ORDER — ACETAMINOPHEN 500 MG PO TABS: 1000.0000 mg | ORAL_TABLET | Freq: Four times a day (QID) | ORAL | Status: DC | PRN

## 2021-08-29 NOTE — OB Triage Note (Signed)
Presents with complaint of decreased fetal movement since Sunday, after going to ED at [facility] for an anxiety attack. Patient states that they gave her a medication that made her really sleepy for 2 days. Denies any abdominal pain or cramping, no bleeding or LOF. EFMs placed on. ?

## 2021-08-29 NOTE — Discharge Summary (Signed)
Jordan [name] is a 36 y.o. female. She is at [redacted]w[redacted]d gestation. Patient's last menstrual period was 01/23/2021 (exact date). ?Estimated Date of Delivery: 11/03/21 ? ?Prenatal care site:  [facility] ? ?Chief complaint: decreased fetal movement ? ?HPI: Jordan [name] presents to L&D with complaints of decreased fetal movement since Sunday.  She went to the ED at [facility] for an anxiety attack. She was given vistaril and phenergan.  She denies contractions, LOF, or vaginal bleeding.   ? ? ?S: Resting comfortably. no CTX, no VB.no LOF ? ?Maternal Medical History:  ?Past Medical Hx:  has a past medical history of Asthma, Blood transfusion without reported diagnosis, COVID-19 (09/02/2018), COVID-19 long hauler manifesting chronic dyspnea, and Seizures (HCC).   ? ?Past Surgical Hx:  has a past surgical history that includes No past surgeries.  ? ?Allergies  ?Allergen Reactions  ? Ivp Dye [Iodinated Contrast Media] Anaphylaxis  ?  At [facility] 09/2019 "A CT PE was ordered, that study complicated by hypertension (SBP 180s), bradycardia to 50s, and O2 sat 88-90%. All of these abnormalities improved within seconds and she did not receive any medications."  ?  ? ?Prior to Admission medications   ?Medication Sig Start Date End Date Taking? Authorizing Provider  ?aspirin EC 81 MG tablet Take 81 mg by mouth daily. Swallow whole.   Yes [provider]  ?hydrOXYzine (VISTARIL) 25 MG capsule Take 1-2 capsules (25-50 mg total) by mouth every 6 (six) hours as needed for anxiety. 08/27/21  Yes Katrinka Blazing, [place], CNM  ?Prenatal Vit-Fe Fumarate-FA (MULTIVITAMIN-PRENATAL) 27-0.8 MG TABS tablet Take 1 tablet by mouth daily at 12 noon.   Yes [provider]  ? ? ?Social History: She  reports that she has never smoked. She has never used smokeless tobacco. She reports that she does not drink alcohol and does not use drugs. ? ?Family History: family history includes Breast cancer in her maternal grandmother; Healthy in her father  and mother; Migraines in her mother; Stomach cancer in her paternal grandmother.  ? ?Review of Systems: A full review of systems was performed and negative except as noted in the HPI.   ? ?O: ? Resp 16   LMP 01/23/2021 (Exact Date)  ?No results found for this or any previous visit (from the past 48 hour(s)).  ? ?Constitutional: NAD, AAOx3  ?HE/ENT: extraocular movements grossly intact, moist mucous membranes ?CV: RRR ?PULM: nl respiratory effort ?Abd: gravid, non-tender, non-distended, soft  ?Ext: Non-tender, Nonedmeatous ?Psych: mood appropriate, speech normal ?Pelvic : deferred ? ? ?Fetal Monitor: ?Baseline: 140 bpm ?Variability: moderate ?Accels: Present ?Decels: none ?Toco: none ? ?Category: I ?NST: Reactive  ? ? ?Assessment: 36 y.o. [redacted]w[redacted]d here for antenatal surveillance during pregnancy. ? ?Principle diagnosis: Decreased fetal movement affecting management of pregnancy in third trimester [O36.8130]  ? ?Plan: ?NST reviewed  ?Fetal Wellbeing: Reassuring Cat 1 tracing. ?Reactive NST  ?Kick counts reviewed, reassurance provided  ?D/c home stable, precautions reviewed, follow-up as scheduled.  ? ?----- ?Margaretmary Eddy, CNM ?Certified Nurse Midwife ?[facility] ?[facility]  ? ? ?

## 2021-09-22 ENCOUNTER — Ambulatory Visit
Admission: EM | Admit: 2021-09-22 | Discharge: 2021-09-22 | Disposition: A | Discharge status: Home / Self Care | Payer: Medicaid Other | Attending: Internal Medicine | Admitting: Internal Medicine

## 2021-09-22 DIAGNOSIS — R82998 Other abnormal findings in urine: Secondary | ICD-10-CM | POA: Insufficient documentation

## 2021-09-22 DIAGNOSIS — B3731 Acute candidiasis of vulva and vagina: Secondary | ICD-10-CM | POA: Insufficient documentation

## 2021-09-22 LAB — WET PREP, GENITAL
Clue Cells Wet Prep HPF POC: NONE SEEN
Sperm: NONE SEEN
Trich, Wet Prep: NONE SEEN
WBC, Wet Prep HPF POC: <10 (ref <10)

## 2021-09-22 LAB — URINALYSIS, MICROSCOPIC (REFLEX)
RBC / HPF: NONE SEEN RBC/hpf (ref 0–5)
Squamous Epithelial / HPF: >50 (ref 0–5)

## 2021-09-22 LAB — URINALYSIS, ROUTINE W REFLEX MICROSCOPIC
Bilirubin Urine: NEGATIVE
Glucose, UA: NEGATIVE mg/dL
Hgb urine dipstick: NEGATIVE
Ketones, ur: NEGATIVE mg/dL
Nitrite: NEGATIVE
Protein, ur: NEGATIVE mg/dL
Specific Gravity, Urine: 1.02 (ref 1.005–1.030)
pH: 6.5 (ref 5.0–8.0)

## 2021-09-22 NOTE — ED Provider Notes (Signed)
[facility]    CSN: [id number] Arrival date & time: 09/22/21  9983      History   Chief Complaint Chief Complaint  Patient presents with   Vaginal Itching   Abdominal Pain    Abdominal pressure     HPI Jordan [name] is a 36 y.o. female who presents with vaginal irritaion, and mild itching today. She has been getting frequent yeast infection and been using Monistat cream. She wants to make sure she does not have BV also. She is [redacted] weeks pregnant and not concerned of STD's    Past Medical History:  Diagnosis Date   Asthma    Blood transfusion without reported diagnosis    COVID-19 09/02/2018   COVID-19 long hauler manifesting chronic dyspnea    Seizures (HCC)     Patient Active Problem List   Diagnosis Date Noted   Decreased fetal movement affecting management of pregnancy in third trimester 08/29/2021   Genital warts 04/14/2020   Left ovarian cyst 04/14/2020   Chronic dyspnea 10/10/2019   Acute respiratory failure with hypoxia (HCC) 10/10/2019   Bronchitis 10/10/2019   History of 2019 novel coronavirus disease (COVID-19) 10/03/2019   Healthcare-associated pneumonia 09/29/2019   Sinus tachycardia 09/29/2019   Hypokalemia 09/29/2019   Sepsis (HCC) 09/29/2019   Women's annual routine gynecological examination 09/23/2019   Vaginal discharge 09/23/2019    Past Surgical History:  Procedure Laterality Date   NO PAST SURGERIES      OB History     Gravida  1   Para  0   Term  0   Preterm  0   AB  0   Living  0      SAB  0   IAB  0   Ectopic  0   Multiple  0   Live Births  0            Home Medications    Prior to Admission medications   Medication Sig Start Date End Date Taking? Authorizing Provider  aspirin EC 81 MG tablet Take 81 mg by mouth daily. Swallow whole.    [provider]  hydrOXYzine (VISTARIL) 25 MG capsule Take 1-2 capsules (25-50 mg total) by mouth every 6 (six) hours as needed for anxiety.  08/27/21   Katrinka Blazing, [place], CNM  Prenatal Vit-Fe Fumarate-FA (MULTIVITAMIN-PRENATAL) 27-0.8 MG TABS tablet Take 1 tablet by mouth daily at 12 noon.    [provider]    Family History Family History  Problem Relation Age of Onset   Stomach cancer Paternal Grandmother    Breast cancer Maternal Grandmother    Healthy Father    Healthy Mother    Migraines Mother     Social History Social History   Tobacco Use   Smoking status: Never   Smokeless tobacco: Never  Vaping Use   Vaping Use: Never used  Substance Use Topics   Alcohol use: No   Drug use: No     Allergies   Ivp dye [iodinated contrast media]   Review of Systems Review of Systems  Genitourinary:  Positive for vaginal discharge. Negative for dysuria and pelvic pain.    Physical Exam Triage Vital Signs ED Triage Vitals  Enc Vitals Group     BP 09/22/21 0947 123/81     Pulse Rate 09/22/21 0947 (!) 115     Resp 09/22/21 0947 16     Temp 09/22/21 0947 98.8 F (37.1 C)     Temp Source 09/22/21 0947  Oral     SpO2 09/22/21 0947 98 %     Weight --      Height --      Head Circumference --      Peak Flow --      Pain Score 09/22/21 0944 10     Pain Loc --      Pain Edu? --      Excl. in GC? --    No data found.  Updated Vital Signs BP 123/81 (BP Location: Right Arm)   Pulse (!) 115   Temp 98.8 F (37.1 C) (Oral)   Resp 16   LMP 01/23/2021 (Exact Date)   SpO2 98%   Visual Acuity Right Eye Distance:   Left Eye Distance:   Bilateral Distance:    Right Eye Near:   Left Eye Near:    Bilateral Near:     Physical Exam Vitals and nursing note reviewed.  Constitutional:      Appearance: She is well-developed.  Eyes:     General: No scleral icterus. Pulmonary:     Effort: Pulmonary effort is normal.  Musculoskeletal:        General: Normal range of motion.     Cervical back: Neck supple.  Neurological:     Mental Status: She is alert and oriented to person, place, and time.     Gait:  Gait normal.  Psychiatric:        Mood and Affect: Mood normal.        Behavior: Behavior normal.        Thought Content: Thought content normal.        Judgment: Judgment normal.     UC Treatments / Results  Labs (all labs ordered are listed, but only abnormal results are displayed) Labs Reviewed  WET PREP, GENITAL - Abnormal; Notable for the following components:      Result Value   Yeast Wet Prep HPF POC PRESENT (*)    All other components within normal limits  URINALYSIS, ROUTINE W REFLEX MICROSCOPIC - Abnormal; Notable for the following components:   APPearance HAZY (*)    Leukocytes,Ua SMALL (*)    All other components within normal limits  URINALYSIS, MICROSCOPIC (REFLEX) - Abnormal; Notable for the following components:   Bacteria, UA RARE (*)    All other components within normal limits  URINE CULTURE  CERVICOVAGINAL ANCILLARY ONLY    EKG   Radiology No results found.  Procedures Procedures (including critical care time)  Medications Ordered in UC Medications - No data to display  Initial Impression / Assessment and Plan / UC Course  I have reviewed the triage vital signs and the nursing notes. Pertinent labs  results that were available during my care of the patient were reviewed by me and considered in my medical decision making (see chart for details).  She was advised to continue the Monistat cream as she has been.    Final Clinical Impressions(s) / UC Diagnoses   Final diagnoses:  Vaginal yeast infection     Discharge Instructions      Continue using the Monistat cream as you have been      ED Prescriptions   None    PDMP not reviewed this encounter.   Garey Ham, PA-C 09/22/21 1042

## 2021-09-22 NOTE — Discharge Instructions (Signed)
Continue using the Monistat cream as you have been

## 2021-09-22 NOTE — ED Triage Notes (Signed)
Patient presents to Urgent Care with complaints of vaginal irritation since yesterday. Has a hx of bv or yeast. No concern for STDs requesting testing.

## 2021-09-24 LAB — URINE CULTURE: Culture: 10000 — AB

## 2021-09-26 LAB — CERVICOVAGINAL ANCILLARY ONLY
Chlamydia: NEGATIVE
Comment: NEGATIVE
Comment: NEGATIVE
Comment: NORMAL
Neisseria Gonorrhea: NEGATIVE
Trichomonas: NEGATIVE

## 2021-09-27 ENCOUNTER — Encounter (HOSPITAL_COMMUNITY): Payer: Self-pay | Admitting: Obstetrics and Gynecology

## 2021-09-27 ENCOUNTER — Inpatient Hospital Stay (HOSPITAL_COMMUNITY)
Admission: AD | Admit: 2021-09-27 | Discharge: 2021-09-27 | Disposition: A | Discharge status: Home / Self Care | Payer: Medicaid Other | Attending: Obstetrics and Gynecology | Admitting: Obstetrics and Gynecology

## 2021-09-27 ENCOUNTER — Other Ambulatory Visit: Payer: Self-pay

## 2021-09-27 DIAGNOSIS — Z3A34 34 weeks gestation of pregnancy: Secondary | ICD-10-CM | POA: Insufficient documentation

## 2021-09-27 DIAGNOSIS — Z8616 Personal history of COVID-19: Secondary | ICD-10-CM | POA: Insufficient documentation

## 2021-09-27 DIAGNOSIS — Z3689 Encounter for other specified antenatal screening: Secondary | ICD-10-CM | POA: Insufficient documentation

## 2021-09-27 DIAGNOSIS — O26813 Pregnancy related exhaustion and fatigue, third trimester: Secondary | ICD-10-CM | POA: Diagnosis present

## 2021-09-27 LAB — COMPREHENSIVE METABOLIC PANEL
ALT: 13 U/L (ref 0–44)
AST: 18 U/L (ref 15–41)
Albumin: 2.9 g/dL — ABNORMAL LOW (ref 3.5–5.0)
Alkaline Phosphatase: 83 U/L (ref 38–126)
Anion gap: 8 (ref 5–15)
BUN: 5 mg/dL — ABNORMAL LOW (ref 6–20)
CO2: 22 mmol/L (ref 22–32)
Calcium: 9.2 mg/dL (ref 8.9–10.3)
Chloride: 108 mmol/L (ref 98–111)
Creatinine, Ser: 0.86 mg/dL (ref 0.44–1.00)
GFR, Estimated: >60 mL/min (ref >60)
Glucose, Bld: 77 mg/dL (ref 70–99)
Potassium: 3.9 mmol/L (ref 3.5–5.1)
Sodium: 138 mmol/L (ref 135–145)
Total Bilirubin: 0.5 mg/dL (ref 0.3–1.2)
Total Protein: 6.6 g/dL (ref 6.5–8.1)

## 2021-09-27 LAB — CBC
HCT: 33 % — ABNORMAL LOW (ref 36.0–46.0)
Hemoglobin: 10.4 g/dL — ABNORMAL LOW (ref 12.0–15.0)
MCH: 26.6 pg (ref 26.0–34.0)
MCHC: 31.5 g/dL (ref 30.0–36.0)
MCV: 84.4 fL (ref 80.0–100.0)
Platelets: 234 10*3/uL (ref 150–400)
RBC: 3.91 MIL/uL (ref 3.87–5.11)
RDW: 15.2 % (ref 11.5–15.5)
WBC: 6.5 10*3/uL (ref 4.0–10.5)
nRBC: 0.5 % — ABNORMAL HIGH (ref 0.0–0.2)

## 2021-09-27 LAB — URINALYSIS, ROUTINE W REFLEX MICROSCOPIC
Bilirubin Urine: NEGATIVE
Glucose, UA: NEGATIVE mg/dL
Hgb urine dipstick: NEGATIVE
Ketones, ur: NEGATIVE mg/dL
Leukocytes,Ua: NEGATIVE
Nitrite: NEGATIVE
Protein, ur: NEGATIVE mg/dL
Specific Gravity, Urine: 1.014 (ref 1.005–1.030)
pH: 6 (ref 5.0–8.0)

## 2021-09-27 MED ORDER — DOCUSATE SODIUM 100 MG PO CAPS: 100.0000 mg | ORAL_CAPSULE | Freq: Two times a day (BID) | ORAL | 2 refills | Status: DC | PRN

## 2021-09-27 MED ORDER — POLYSACCHARIDE IRON COMPLEX 150 MG PO CAPS: 150.0000 mg | ORAL_CAPSULE | ORAL | 0 refills | Status: DC

## 2021-09-27 NOTE — MAU Provider Note (Signed)
History     CSN: [id number]  Arrival date and time: 09/27/21 1345   Event Date/Time   First Provider Initiated Contact with Patient 09/27/21 1404      Chief Complaint  Patient presents with   Fatigue   HPI Jordan [name] is a 36 y.o. G1P0000 at [redacted]w[redacted]d who presents to MAU with chief complaints of fatigue and exhaustion. These are recurrent problems. Patient had her baby shower last weekend and is concerned she worked herself into exhaustion. She works from home and denies physically demanding tasks.   Patient states her brother called 911 because she seemed as if she was going in and out of consciousness. She states during this time her right side was numb for a period of 3-4 minutes. She was advised to present to the ED for evaluation and elected to come to MAU via POV instead. She denied history of similar episodes of near syncope.   Patient endorses previous episodes of anxiety which caused SOB and difficulty performing ADLs. She states she was given Vistaril and slept for two days. She was subsequently advised by her OB to discontinue use due to the heavy sedation she experienced. She states today's episode did not feel like an anxiety attack. She is in outpatient therapy with sessions every two weeks and feels this is a good solution for her.  She denies contractions, vaginal bleeding, abnormal discharge, fever or recent illness.   Patient receives care with [facility]. Her next appointment is Friday 09/29/2021.  OB History     Gravida  1   Para  0   Term  0   Preterm  0   AB  0   Living  0      SAB  0   IAB  0   Ectopic  0   Multiple  0   Live Births  0           Past Medical History:  Diagnosis Date   Asthma    Blood transfusion without reported diagnosis    COVID-19 09/02/2018   COVID-19 long hauler manifesting chronic dyspnea    Seizures (HCC)     Past Surgical History:  Procedure Laterality Date   NO PAST SURGERIES      Family  History  Problem Relation Age of Onset   Stomach cancer Paternal Grandmother    Breast cancer Maternal Grandmother    Healthy Father    Healthy Mother    Migraines Mother     Social History   Tobacco Use   Smoking status: Never   Smokeless tobacco: Never  Vaping Use   Vaping Use: Never used  Substance Use Topics   Alcohol use: No   Drug use: No    Allergies:  Allergies  Allergen Reactions   Ivp Dye [Iodinated Contrast Media] Anaphylaxis    At [facility] 09/[facility] "A CT PE was ordered, that study complicated by hypertension (SBP 180s), bradycardia to 50s, and O2 sat 88-90%. All of these abnormalities improved within seconds and she did not receive any medications."    Medications Prior to Admission  Medication Sig Dispense Refill Last Dose   aspirin EC 81 MG tablet Take 81 mg by mouth daily. Swallow whole.   09/27/2021   Prenatal Vit-Fe Fumarate-FA (MULTIVITAMIN-PRENATAL) 27-0.8 MG TABS tablet Take 1 tablet by mouth daily at 12 noon.   09/27/2021   hydrOXYzine (VISTARIL) 25 MG capsule Take 1-2 capsules (25-50 mg total) by mouth every 6 (six) hours as needed  for anxiety. 30 capsule 3     Review of Systems  Constitutional:  Positive for fatigue.  Respiratory:  Negative for chest tightness and shortness of breath.   Cardiovascular:  Negative for chest pain and palpitations.  Neurological:  Positive for weakness.  All other systems reviewed and are negative. Physical Exam   Blood pressure 117/79, pulse (!) 115, temperature 98.2 F (36.8 C), resp. rate 20, last menstrual period 01/23/2021, SpO2 100 %.  Physical Exam Vitals and nursing note reviewed. Exam conducted with a chaperone present.  Constitutional:      Appearance: Normal appearance.  Cardiovascular:     Rate and Rhythm: Normal rate and regular rhythm.     Pulses: Normal pulses.     Heart sounds: Normal heart sounds.  Pulmonary:     Effort: Pulmonary effort is normal.     Breath sounds: Normal breath sounds.   Abdominal:     Comments: Gravid  Skin:    Capillary Refill: Capillary refill takes less than 2 seconds.  Neurological:     Mental Status: She is alert and oriented to person, place, and time.  Psychiatric:        Mood and Affect: Mood normal.        Behavior: Behavior normal.        Thought Content: Thought content normal.        Judgment: Judgment normal.    MAU Course  Procedures  MDM  --EMR reviewed. Tachycardia c/w patient baseline, not associated with hydration status --ECG consistent with patient baseline: sinus tachycardia with nonspecific ST and T wave abnormality --Reactive tracing: baseline 140, mod var, + accels, no decels --Intact Neuro exam. No history of similar episodes. Patient back to baseline and stable following 3-4 minute episode. Discussed with Dr. Wilford CornerArora, Neurologist. No indication for emergent imaging. CNM at bedside, offered MRI during MAU encounter or Amb referral to Neuro. Patient elects Amb referral.   Orders Placed This Encounter  Procedures   Urinalysis, Routine w reflex microscopic Urine, Clean Catch   CBC   Comprehensive metabolic panel   Ambulatory referral to Neurology   ED EKG   Discharge patient Discharge disposition: 01-Home or Self Care; Discharge patient date: 09/27/2021   Patient Vitals for the past 24 hrs:  BP Temp Pulse Resp SpO2  09/27/21 1529 121/83 -- (!) 115 -- --  09/27/21 1357 117/79 98.2 F (36.8 C) (!) 115 20 100 %   Results for orders placed or performed during the hospital encounter of 09/27/21 (from the past 24 hour(s))  Urinalysis, Routine w reflex microscopic Urine, Clean Catch     Status: Abnormal   Collection Time: 09/27/21  2:00 PM  Result Value Ref Range   Color, Urine YELLOW YELLOW   APPearance CLOUDY (A) CLEAR   Specific Gravity, Urine 1.014 1.005 - 1.030   pH 6.0 5.0 - 8.0   Glucose, UA NEGATIVE NEGATIVE mg/dL   Hgb urine dipstick NEGATIVE NEGATIVE   Bilirubin Urine NEGATIVE NEGATIVE   Ketones, ur NEGATIVE  NEGATIVE mg/dL   Protein, ur NEGATIVE NEGATIVE mg/dL   Nitrite NEGATIVE NEGATIVE   Leukocytes,Ua NEGATIVE NEGATIVE  CBC     Status: Abnormal   Collection Time: 09/27/21  2:23 PM  Result Value Ref Range   WBC 6.5 4.0 - 10.5 K/uL   RBC 3.91 3.87 - 5.11 MIL/uL   Hemoglobin 10.4 (L) 12.0 - 15.0 g/dL   HCT 60.433.0 (L) 54.036.0 - 98.146.0 %   MCV 84.4 80.0 - 100.0 fL  MCH 26.6 26.0 - 34.0 pg   MCHC 31.5 30.0 - 36.0 g/dL   RDW 21.3 08.6 - 57.8 %   Platelets 234 150 - 400 K/uL   nRBC 0.5 (H) 0.0 - 0.2 %  Comprehensive metabolic panel     Status: Abnormal   Collection Time: 09/27/21  2:23 PM  Result Value Ref Range   Sodium 138 135 - 145 mmol/L   Potassium 3.9 3.5 - 5.1 mmol/L   Chloride 108 98 - 111 mmol/L   CO2 22 22 - 32 mmol/L   Glucose, Bld 77 70 - 99 mg/dL   BUN 5 (L) 6 - 20 mg/dL   Creatinine, Ser 4.69 0.44 - 1.00 mg/dL   Calcium 9.2 8.9 - 62.9 mg/dL   Total Protein 6.6 6.5 - 8.1 g/dL   Albumin 2.9 (L) 3.5 - 5.0 g/dL   AST 18 15 - 41 U/L   ALT 13 0 - 44 U/L   Alkaline Phosphatase 83 38 - 126 U/L   Total Bilirubin 0.5 0.3 - 1.2 mg/dL   GFR, Estimated >52 >84 mL/min   Anion gap 8 5 - 15   Meds ordered this encounter  Medications   iron polysaccharides (NIFEREX) 150 MG capsule    Sig: Take 1 capsule (150 mg total) by mouth every other day.    Dispense:  15 capsule    Refill:  0    Order Specific Question:   Supervising Provider    Answer:   Myna Hidalgo [[id number]]   docusate sodium (COLACE) 100 MG capsule    Sig: Take 1 capsule (100 mg total) by mouth 2 (two) times daily as needed.    Dispense:  30 capsule    Refill:  2    Order Specific Question:   Supervising Provider    Answer:   Myna Hidalgo [[id number]]   Assessment and Plan  --36 y.o. G1P0000 at [redacted]w[redacted]d  --Reactive tracing --No acute processes observed during MAU encounter --Hgb 10.4, initiate Fe supplement every other day --Discharge home in stable condition  F/U: Amb Referral to Neurology Next [facility] appointment is  Friday 09/29/21  Calvert Cantor, MSA, MSN, CNM 09/27/2021, 4:57 PM

## 2021-09-27 NOTE — MAU Note (Addendum)
ADRIELLA ESSEX is a 36 y.o. at [redacted]w[redacted]d here in MAU reporting: Pt reports she was working from home when she wasn't able to really see the screen. Pt reports she thought she might be hungry so she ate food and rested. Pt reports neither helped her symptoms. Pt reports her brother called 911 because she was going in and out feeling like she was going to pass out. Pt reports EMS came to her house and evaluated her. Pt reports her brother brought her instead of EMS. Pt reports her right side and arm was going numb while EMS was present.  Onset of complaint: today at 0830 Pain score: none There were no vitals filed for this visit.    Lab orders placed from triage:  ua

## 2021-10-05 LAB — OB RESULTS CONSOLE GBS: GBS: NEGATIVE

## 2021-10-06 ENCOUNTER — Encounter (HOSPITAL_COMMUNITY): Payer: Self-pay | Admitting: Obstetrics and Gynecology

## 2021-10-06 ENCOUNTER — Inpatient Hospital Stay (HOSPITAL_COMMUNITY)
Admission: AD | Admit: 2021-10-06 | Discharge: 2021-10-06 | Disposition: A | Discharge status: Home / Self Care | Payer: Medicaid Other | Attending: Obstetrics and Gynecology | Admitting: Obstetrics and Gynecology

## 2021-10-06 DIAGNOSIS — Z3A36 36 weeks gestation of pregnancy: Secondary | ICD-10-CM | POA: Insufficient documentation

## 2021-10-06 DIAGNOSIS — O4703 False labor before 37 completed weeks of gestation, third trimester: Secondary | ICD-10-CM | POA: Diagnosis present

## 2021-10-06 DIAGNOSIS — O479 False labor, unspecified: Secondary | ICD-10-CM | POA: Diagnosis not present

## 2021-10-06 HISTORY — DX: Papillomavirus as the cause of diseases classified elsewhere: B97.7

## 2021-10-06 HISTORY — DX: Anxiety disorder, unspecified: F41.9

## 2021-10-06 HISTORY — DX: Tachycardia, unspecified: R00.0

## 2021-10-06 HISTORY — DX: Depression, unspecified: F32.A

## 2021-10-06 HISTORY — DX: Unspecified ovarian cyst, unspecified side: N83.209

## 2021-10-06 HISTORY — DX: Encounter for procreative management, unspecified: Z31.9

## 2021-10-06 LAB — POCT FERN TEST: POCT Fern Test: NEGATIVE

## 2021-10-06 NOTE — MAU Note (Signed)
Jordan [name] is a 36 y.o. at [redacted]w[redacted]d here in MAU reporting: woke up at 0400, water leaking and contractions.  Now every 3-4 min. No bleeding.  Small amt of fluid still coming, had to change underwear. Reports +FM, feeling pressure. Was 1 cm yesterday. Felt weak after exam. Started having chest pain yesterday while at dr.  Has had again this morning,"sharp pain", feels like her heart is racing.   Onset of complaint: 0400 Pain score: 10/ch 8 Vitals:   10/06/21 0959 10/06/21 1000  BP: 124/82   Pulse: (!) 123   Resp: 20   Temp: 98.3 F (36.8 C)   SpO2:  100%     FHT:147 Lab orders placed from triage:  none

## 2021-10-16 ENCOUNTER — Encounter (HOSPITAL_COMMUNITY): Payer: Self-pay

## 2021-10-16 ENCOUNTER — Telehealth (HOSPITAL_COMMUNITY): Payer: Self-pay | Admitting: *Deleted

## 2021-10-16 NOTE — Telephone Encounter (Signed)
Preadmission screen  

## 2021-10-17 ENCOUNTER — Encounter (HOSPITAL_COMMUNITY): Payer: Self-pay | Admitting: *Deleted

## 2021-10-17 ENCOUNTER — Encounter (HOSPITAL_COMMUNITY): Payer: Self-pay | Admitting: Obstetrics and Gynecology

## 2021-10-17 ENCOUNTER — Inpatient Hospital Stay (HOSPITAL_COMMUNITY)
Admission: AD | Admit: 2021-10-17 | Discharge: 2021-10-17 | Disposition: A | Discharge status: Home / Self Care | Payer: Medicaid Other | Attending: Obstetrics and Gynecology | Admitting: Obstetrics and Gynecology

## 2021-10-17 ENCOUNTER — Other Ambulatory Visit: Payer: Self-pay

## 2021-10-17 ENCOUNTER — Telehealth (HOSPITAL_COMMUNITY): Payer: Self-pay | Admitting: *Deleted

## 2021-10-17 DIAGNOSIS — O26893 Other specified pregnancy related conditions, third trimester: Secondary | ICD-10-CM | POA: Diagnosis present

## 2021-10-17 DIAGNOSIS — O368130 Decreased fetal movements, third trimester, not applicable or unspecified: Secondary | ICD-10-CM | POA: Insufficient documentation

## 2021-10-17 DIAGNOSIS — Z8616 Personal history of COVID-19: Secondary | ICD-10-CM | POA: Diagnosis not present

## 2021-10-17 DIAGNOSIS — Z3A37 37 weeks gestation of pregnancy: Secondary | ICD-10-CM

## 2021-10-17 DIAGNOSIS — R079 Chest pain, unspecified: Secondary | ICD-10-CM | POA: Diagnosis not present

## 2021-10-17 DIAGNOSIS — O4703 False labor before 37 completed weeks of gestation, third trimester: Secondary | ICD-10-CM

## 2021-10-17 DIAGNOSIS — O471 False labor at or after 37 completed weeks of gestation: Secondary | ICD-10-CM | POA: Diagnosis not present

## 2021-10-17 NOTE — MAU Provider Note (Signed)
  History     CSN: [id number]  Arrival date and time: 10/17/21 1635   Event Date/Time   First Provider Initiated Contact with Patient 10/17/21 1714      Chief Complaint  Patient presents with  . Contractions   HPI [name] is a 36 y.o. year old G7P0000 female at [redacted]w[redacted]d weeks gestation who presents to MAU reporting ***.   OB History     Gravida  1   Para  0   Term  0   Preterm  0   AB  0   Living  0      SAB  0   IAB  0   Ectopic  0   Multiple  0   Live Births  0           Past Medical History:  Diagnosis Date  . Anxiety   . Asthma   . Blood transfusion without reported diagnosis   . COVID-19 09/02/2018  . COVID-19 long hauler manifesting chronic dyspnea   . Depression   . HPV (human papilloma virus) infection   . Infertility management   . Ovarian cyst   . Seizures (HCC)   . Tachycardia    after covid, saw cardiologist    Past Surgical History:  Procedure Laterality Date  . NO PAST SURGERIES      Family History  Problem Relation Age of Onset  . Stomach cancer Paternal Grandmother   . Breast cancer Maternal Grandmother   . Healthy Father   . Healthy Mother   . Migraines Mother     Social History   Tobacco Use  . Smoking status: Never  . Smokeless tobacco: Never  Vaping Use  . Vaping Use: Never used  Substance Use Topics  . Alcohol use: No  . Drug use: No    Allergies:  Allergies  Allergen Reactions  . Ivp Dye [Iodinated Contrast Media] Anaphylaxis    At [facility] 09/[facility] "A CT PE was ordered, that study complicated by hypertension (SBP 180s), bradycardia to 50s, and O2 sat 88-90%. All of these abnormalities improved within seconds and she did not receive any medications."    Medications Prior to Admission  Medication Sig Dispense Refill Last Dose  . aspirin EC 81 MG tablet Take 81 mg by mouth daily. Swallow whole.   10/17/2021  . Prenatal Vit-Fe Fumarate-FA (MULTIVITAMIN-PRENATAL) 27-0.8 MG TABS tablet Take 1 tablet  by mouth daily at 12 noon.   10/17/2021  . docusate sodium (COLACE) 100 MG capsule Take 1 capsule (100 mg total) by mouth 2 (two) times daily as needed. 30 capsule 2   . iron polysaccharides (NIFEREX) 150 MG capsule Take 1 capsule (150 mg total) by mouth every other day. 15 capsule 0     Review of Systems  Constitutional: Negative.    Physical Exam   Blood pressure 131/79, pulse (!) 120, temperature 98.2 F (36.8 C), resp. rate (!) 22, last menstrual period 01/23/2021, SpO2 100 %.  Physical Exam  MAU Course  Procedures  MDM ***  Assessment and Plan  ***  Jordan [name], CNM 10/17/2021, 6:22 PM

## 2021-10-17 NOTE — Telephone Encounter (Signed)
Preadmission screen  

## 2021-10-17 NOTE — MAU Note (Signed)
Jordan [name] is a 36 y.o. at [redacted]w[redacted]d here in MAU reporting: Pt reports ctx's every 5-6 minutes.  Denies LOF Denies vaginal bleeding  Pt reports chest pain for the last hour. Onset of complaint: today  Pain score: 10/10 chest pain 10/10 abdominal pain  Reports decreased fetal movement.  There were no vitals filed for this visit.    Lab orders placed from triage:  none

## 2021-10-28 ENCOUNTER — Other Ambulatory Visit: Payer: Self-pay

## 2021-10-28 ENCOUNTER — Inpatient Hospital Stay (HOSPITAL_COMMUNITY)
Admission: AD | Admit: 2021-10-28 | Discharge: 2021-10-30 | DRG: 807 | Disposition: A | Discharge status: Home / Self Care | Payer: Medicaid Other | Attending: Obstetrics and Gynecology | Admitting: Obstetrics and Gynecology

## 2021-10-28 ENCOUNTER — Inpatient Hospital Stay (HOSPITAL_COMMUNITY): Payer: Medicaid Other

## 2021-10-28 ENCOUNTER — Inpatient Hospital Stay (HOSPITAL_COMMUNITY): Payer: Medicaid Other | Admitting: Anesthesiology

## 2021-10-28 ENCOUNTER — Encounter (HOSPITAL_COMMUNITY): Payer: Self-pay | Admitting: Obstetrics and Gynecology

## 2021-10-28 DIAGNOSIS — Z3A39 39 weeks gestation of pregnancy: Secondary | ICD-10-CM | POA: Diagnosis not present

## 2021-10-28 DIAGNOSIS — Z8616 Personal history of COVID-19: Secondary | ICD-10-CM

## 2021-10-28 DIAGNOSIS — D509 Iron deficiency anemia, unspecified: Secondary | ICD-10-CM | POA: Diagnosis present

## 2021-10-28 DIAGNOSIS — O26893 Other specified pregnancy related conditions, third trimester: Secondary | ICD-10-CM | POA: Diagnosis present

## 2021-10-28 DIAGNOSIS — O9902 Anemia complicating childbirth: Secondary | ICD-10-CM | POA: Diagnosis present

## 2021-10-28 LAB — CBC
HCT: 30.6 % — ABNORMAL LOW (ref 36.0–46.0)
Hemoglobin: 9.7 g/dL — ABNORMAL LOW (ref 12.0–15.0)
MCH: 25.9 pg — ABNORMAL LOW (ref 26.0–34.0)
MCHC: 31.7 g/dL (ref 30.0–36.0)
MCV: 81.6 fL (ref 80.0–100.0)
Platelets: 226 10*3/uL (ref 150–400)
RBC: 3.75 MIL/uL — ABNORMAL LOW (ref 3.87–5.11)
RDW: 16 % — ABNORMAL HIGH (ref 11.5–15.5)
WBC: 5.5 10*3/uL (ref 4.0–10.5)
nRBC: 0.5 % — ABNORMAL HIGH (ref 0.0–0.2)

## 2021-10-28 LAB — RPR: RPR Ser Ql: NONREACTIVE

## 2021-10-28 MED ORDER — EPHEDRINE 5 MG/ML INJ: 10.0000 mg | INTRAVENOUS | Status: DC | PRN

## 2021-10-28 MED ORDER — BUTORPHANOL TARTRATE 1 MG/ML IJ SOLN
1.0000 mg | INTRAMUSCULAR | Status: DC | PRN
Start: 2021-10-28 — End: 2021-10-28

## 2021-10-28 MED ORDER — PHENYLEPHRINE 80 MCG/ML (10ML) SYRINGE FOR IV PUSH (FOR BLOOD PRESSURE SUPPORT): 80.0000 ug | PREFILLED_SYRINGE | INTRAVENOUS | Status: DC | PRN

## 2021-10-28 MED ORDER — LACTATED RINGERS IV SOLN
500.0000 mL | Freq: Once | INTRAVENOUS | Status: AC
Start: 2021-10-28 — End: 2021-10-28
  Administered 2021-10-28: 500 mL via INTRAVENOUS

## 2021-10-28 MED ORDER — OXYCODONE-ACETAMINOPHEN 5-325 MG PO TABS: 2.0000 | ORAL_TABLET | ORAL | Status: DC | PRN

## 2021-10-28 MED ORDER — OXYTOCIN BOLUS FROM INFUSION
333.0000 mL | Freq: Once | INTRAVENOUS | Status: AC
  Administered 2021-10-29: 333 mL via INTRAVENOUS

## 2021-10-28 MED ORDER — LACTATED RINGERS IV SOLN: INTRAVENOUS | Status: DC

## 2021-10-28 MED ORDER — OXYTOCIN-SODIUM CHLORIDE 30-0.9 UT/500ML-% IV SOLN
2.5000 [IU]/h | INTRAVENOUS | Status: DC
  Administered 2021-10-29: 2.5 [IU]/h via INTRAVENOUS
  Filled 2021-10-28: qty 500

## 2021-10-28 MED ORDER — FENTANYL CITRATE (PF) 100 MCG/2ML IJ SOLN
50.0000 ug | INTRAMUSCULAR | Status: DC | PRN
  Administered 2021-10-28: 50 ug via INTRAVENOUS
  Filled 2021-10-28 (×2): qty 2

## 2021-10-28 MED ORDER — LACTATED RINGERS IV SOLN
500.0000 mL | INTRAVENOUS | Status: DC | PRN
  Administered 2021-10-28: 500 mL via INTRAVENOUS

## 2021-10-28 MED ORDER — OXYTOCIN-SODIUM CHLORIDE 30-0.9 UT/500ML-% IV SOLN
1.0000 m[IU]/min | INTRAVENOUS | Status: DC
  Administered 2021-10-28: 2 m[IU]/min via INTRAVENOUS

## 2021-10-28 MED ORDER — PHENYLEPHRINE 80 MCG/ML (10ML) SYRINGE FOR IV PUSH (FOR BLOOD PRESSURE SUPPORT)
80.0000 ug | PREFILLED_SYRINGE | INTRAVENOUS | Status: DC | PRN
  Filled 2021-10-28: qty 10

## 2021-10-28 MED ORDER — TERBUTALINE SULFATE 1 MG/ML IJ SOLN: 0.2500 mg | Freq: Once | INTRAMUSCULAR | Status: DC | PRN

## 2021-10-28 MED ORDER — ONDANSETRON HCL 4 MG/2ML IJ SOLN: 4.0000 mg | Freq: Four times a day (QID) | INTRAMUSCULAR | Status: DC | PRN

## 2021-10-28 MED ORDER — ACETAMINOPHEN 325 MG PO TABS: 650.0000 mg | ORAL_TABLET | ORAL | Status: DC | PRN

## 2021-10-28 MED ORDER — LIDOCAINE HCL (PF) 1 % IJ SOLN
INTRAMUSCULAR | Status: DC | PRN
  Administered 2021-10-28: 8 mL via EPIDURAL

## 2021-10-28 MED ORDER — FENTANYL-BUPIVACAINE-NACL 0.5-0.125-0.9 MG/250ML-% EP SOLN
12.0000 mL/h | EPIDURAL | Status: DC | PRN
  Administered 2021-10-28: 12 mL/h via EPIDURAL
  Filled 2021-10-28: qty 250

## 2021-10-28 MED ORDER — LIDOCAINE HCL (PF) 1 % IJ SOLN: 30.0000 mL | INTRAMUSCULAR | Status: DC | PRN

## 2021-10-28 MED ORDER — FENTANYL CITRATE (PF) 100 MCG/2ML IJ SOLN
50.0000 ug | INTRAMUSCULAR | Status: DC | PRN
  Administered 2021-10-28 (×2): 100 ug via INTRAVENOUS
  Administered 2021-10-28: 50 ug via INTRAVENOUS
  Administered 2021-10-28: 100 ug via INTRAVENOUS
  Filled 2021-10-28 (×3): qty 2

## 2021-10-28 MED ORDER — OXYCODONE-ACETAMINOPHEN 5-325 MG PO TABS: 1.0000 | ORAL_TABLET | ORAL | Status: DC | PRN

## 2021-10-28 MED ORDER — SOD CITRATE-CITRIC ACID 500-334 MG/5ML PO SOLN: 30.0000 mL | ORAL | Status: DC | PRN

## 2021-10-28 MED ORDER — DIPHENHYDRAMINE HCL 50 MG/ML IJ SOLN: 12.5000 mg | INTRAMUSCULAR | Status: DC | PRN

## 2021-10-28 MED ORDER — MISOPROSTOL 25 MCG QUARTER TABLET
25.0000 ug | ORAL_TABLET | ORAL | Status: DC | PRN
  Administered 2021-10-28 (×2): 25 ug via VAGINAL
  Filled 2021-10-28 (×2): qty 1

## 2021-10-28 NOTE — Anesthesia Procedure Notes (Signed)
Epidural Patient location during procedure: OB Start time: 10/28/2021 1:50 PM End time: 10/28/2021 2:05 PM  Staffing Anesthesiologist: Mellody Dance, MD Performed: anesthesiologist   Preanesthetic Checklist Completed: patient identified, IV checked, site marked, risks and benefits discussed, monitors and equipment checked, pre-op evaluation and timeout performed  Epidural Patient position: sitting Prep: DuraPrep Patient monitoring: heart rate, cardiac monitor, continuous pulse ox and blood pressure Approach: midline Location: L2-L3 Injection technique: LOR saline  Needle:  Needle type: Tuohy  Needle gauge: 17 G Needle length: 9 cm Needle insertion depth: 7 cm Catheter type: closed end flexible Catheter size: 20 Guage Catheter at skin depth: 12 cm Test dose: negative and Other  Assessment Events: blood not aspirated, injection not painful, no injection resistance and negative IV test  Additional Notes Informed consent obtained prior to proceeding including risk of failure, 1% risk of PDPH, risk of minor discomfort and bruising.  Discussed rare but serious complications including epidural abscess, permanent nerve injury, epidural hematoma.  Discussed alternatives to epidural analgesia and patient desires to proceed.  Timeout performed pre-procedure verifying patient name, procedure, and platelet count.  Patient tolerated procedure well.
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
## 2021-10-28 NOTE — Anesthesia Preprocedure Evaluation (Addendum)
Anesthesia Evaluation  Patient identified by MRN, date of birth, ID band Patient awake    Reviewed: Allergy & Precautions, NPO status , Patient's Chart, lab work & pertinent test results  Airway Mallampati: II  TM Distance: >3 FB Neck ROM: Full    Dental no notable dental hx.    Pulmonary asthma ,  COVID long hauler with chronic dyspnea   Pulmonary exam normal breath sounds clear to auscultation       Cardiovascular negative cardio ROS Normal cardiovascular exam Rhythm:Regular Rate:Normal     Neuro/Psych Seizures -,  PSYCHIATRIC DISORDERS Anxiety Depression    GI/Hepatic negative GI ROS, Neg liver ROS,   Endo/Other  negative endocrine ROS  Renal/GU negative Renal ROS  negative genitourinary   Musculoskeletal scoliosis   Abdominal   Peds negative pediatric ROS (+)  Hematology  (+) Blood dyscrasia, anemia ,   Anesthesia Other Findings   Reproductive/Obstetrics negative OB ROS                            Anesthesia Physical Anesthesia Plan  ASA: 2  Anesthesia Plan: Epidural   Post-op Pain Management:    Induction:   PONV Risk Score and Plan: 2  Airway Management Planned: Natural Airway  Additional Equipment: None  Intra-op Plan:   Post-operative Plan:   Informed Consent: I have reviewed the patients History and Physical, chart, labs and discussed the procedure including the risks, benefits and alternatives for the proposed anesthesia with the patient or authorized representative who has indicated his/her understanding and acceptance.       Plan Discussed with: Anesthesiologist  Anesthesia Plan Comments:         Anesthesia Quick Evaluation

## 2021-10-28 NOTE — Progress Notes (Signed)
OB Progress Note  S: some discomfort with contractions   O: Today's Vitals   10/28/21 0013 10/28/21 0024 10/28/21 0433 10/28/21 0518  BP: 128/88   126/83  Pulse: (!) 126   84  Resp:  16  16  Temp:  (!) 97.4 F (36.3 C)  97.9 F (36.6 C)  TempSrc:  Oral  Oral  Weight:  85.3 kg    Height:  5\' 7"  (1.702 m)    PainSc:   4     Body mass index is 29.46 kg/m.  SVE 2/50/-2, foley balloon placed and inflated with 60cc saline  FHR: 140bpm, mod variability, + accels, no decels Toco: ctx q 1-2 mins   A/P: 36Y G1P0 @ [redacted]w[redacted]d, elective term IOL Fetal wellbeing: cat I tracing IOL: s/p cytotec x 2 (last dose 0511), foley bulb placed, if contractions space start pitocin 2x2 Pain control: IV fentanyl PRN, planning epidural  M. Jordan [name]. MD, 10/28/21 9:23 AM

## 2021-10-28 NOTE — Progress Notes (Signed)
Late entry note due to patient care  OB Progress Note  Patient received epidural around 1400, then SROM shortly after, followed by 5 min decel, then repetitive late decels despite resuscitative measures. Was check by RN and no prolapsed cord palpated. Pitocin was turned off to allow for fetal recovery.   At 1350, SVE 4/100/-2, fetal head well applied. Tracing category I. Restart pitocin at 62mu/min.  Discussed with patient. Reviewed risk of cesarean if resuscitative measures do not result in recovery of baseline. She states understanding.     Alinda Deem, MD 10/28/21 4:27 PM

## 2021-10-29 ENCOUNTER — Encounter (HOSPITAL_COMMUNITY): Payer: Self-pay | Admitting: Obstetrics and Gynecology

## 2021-10-29 LAB — TYPE AND SCREEN
ABO/RH(D): A POS
Antibody Screen: NEGATIVE

## 2021-10-29 LAB — CBC
HCT: 28 % — ABNORMAL LOW (ref 36.0–46.0)
Hemoglobin: 8.9 g/dL — ABNORMAL LOW (ref 12.0–15.0)
MCH: 25.9 pg — ABNORMAL LOW (ref 26.0–34.0)
MCHC: 31.8 g/dL (ref 30.0–36.0)
MCV: 81.4 fL (ref 80.0–100.0)
Platelets: 204 10*3/uL (ref 150–400)
RBC: 3.44 MIL/uL — ABNORMAL LOW (ref 3.87–5.11)
RDW: 16.1 % — ABNORMAL HIGH (ref 11.5–15.5)
WBC: 14.4 10*3/uL — ABNORMAL HIGH (ref 4.0–10.5)
nRBC: 0 % (ref 0.0–0.2)

## 2021-10-29 MED ORDER — PRENATAL MULTIVITAMIN CH
1.0000 | ORAL_TABLET | Freq: Every day | ORAL | Status: DC
  Administered 2021-10-29: 1 via ORAL
  Filled 2021-10-29: qty 1

## 2021-10-29 MED ORDER — COCONUT OIL OIL: 1.0000 | TOPICAL_OIL | Status: DC | PRN

## 2021-10-29 MED ORDER — FLEET ENEMA 7-19 GM/118ML RE ENEM: 1.0000 | ENEMA | Freq: Every day | RECTAL | Status: DC | PRN

## 2021-10-29 MED ORDER — DIPHENHYDRAMINE HCL 25 MG PO CAPS: 25.0000 mg | ORAL_CAPSULE | Freq: Four times a day (QID) | ORAL | Status: DC | PRN

## 2021-10-29 MED ORDER — OXYCODONE HCL 5 MG PO TABS: 10.0000 mg | ORAL_TABLET | ORAL | Status: DC | PRN

## 2021-10-29 MED ORDER — DOCUSATE SODIUM 100 MG PO CAPS: 100.0000 mg | ORAL_CAPSULE | Freq: Two times a day (BID) | ORAL | Status: DC

## 2021-10-29 MED ORDER — BENZOCAINE-MENTHOL 20-0.5 % EX AERO: 1.0000 | INHALATION_SPRAY | CUTANEOUS | Status: DC | PRN

## 2021-10-29 MED ORDER — SIMETHICONE 80 MG PO CHEW: 80.0000 mg | CHEWABLE_TABLET | ORAL | Status: DC | PRN

## 2021-10-29 MED ORDER — ONDANSETRON HCL 4 MG PO TABS: 4.0000 mg | ORAL_TABLET | ORAL | Status: DC | PRN

## 2021-10-29 MED ORDER — WITCH HAZEL-GLYCERIN EX PADS: 1.0000 | MEDICATED_PAD | CUTANEOUS | Status: DC | PRN

## 2021-10-29 MED ORDER — ACETAMINOPHEN 325 MG PO TABS: 650.0000 mg | ORAL_TABLET | ORAL | Status: DC | PRN

## 2021-10-29 MED ORDER — OXYCODONE HCL 5 MG PO TABS: 5.0000 mg | ORAL_TABLET | ORAL | Status: DC | PRN

## 2021-10-29 MED ORDER — ONDANSETRON HCL 4 MG/2ML IJ SOLN: 4.0000 mg | INTRAMUSCULAR | Status: DC | PRN

## 2021-10-29 MED ORDER — DIBUCAINE (PERIANAL) 1 % EX OINT: 1.0000 | TOPICAL_OINTMENT | CUTANEOUS | Status: DC | PRN

## 2021-10-29 MED ORDER — IBUPROFEN 600 MG PO TABS
600.0000 mg | ORAL_TABLET | Freq: Four times a day (QID) | ORAL | Status: DC
  Administered 2021-10-29 – 2021-10-30 (×4): 600 mg via ORAL
  Filled 2021-10-29 (×5): qty 1

## 2021-10-29 MED ORDER — TETANUS-DIPHTH-ACELL PERTUSSIS 5-2.5-18.5 LF-MCG/0.5 IM SUSY: 0.5000 mL | PREFILLED_SYRINGE | Freq: Once | INTRAMUSCULAR | Status: DC

## 2021-10-29 MED ORDER — FERROUS SULFATE 325 (65 FE) MG PO TABS
325.0000 mg | ORAL_TABLET | Freq: Every day | ORAL | Status: DC
  Administered 2021-10-29: 325 mg via ORAL
  Filled 2021-10-29: qty 1

## 2021-10-29 MED ORDER — BISACODYL 10 MG RE SUPP: 10.0000 mg | Freq: Every day | RECTAL | Status: DC | PRN

## 2021-10-29 NOTE — Anesthesia Postprocedure Evaluation (Signed)
Anesthesia Post Note  Patient: Jordan [name]  Procedure(s) Performed: AN AD HOC LABOR EPIDURAL     Patient location during evaluation: Mother Baby Anesthesia Type: Epidural Level of consciousness: awake Pain management: satisfactory to patient Vital Signs Assessment: post-procedure vital signs reviewed and stable Respiratory status: spontaneous breathing Cardiovascular status: stable Anesthetic complications: no   No notable events documented.  Last Vitals:  Vitals:   10/29/21 0435 10/29/21 0833  BP:  117/78  Pulse: (!) 109 95  Resp: 15 18  Temp: 37.5 C 36.8 C  SpO2: 100% 100%    Last Pain:  Vitals:   10/29/21 0833  TempSrc: Oral  PainSc: 3    Pain Goal:                   Cephus Shelling
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
## 2021-10-30 MED ORDER — IBUPROFEN 600 MG PO TABS: 600.0000 mg | ORAL_TABLET | Freq: Four times a day (QID) | ORAL | 0 refills | Status: DC

## 2021-10-30 NOTE — Progress Notes (Signed)
Post Partum Day #1 (delivery 7/2 at 0047) Subjective: Doing well this morning without complaints. Ambulating, voiding, tolerating PO. Minimal lochia. Formula feeding.   Objective: Patient Vitals for the past 24 hrs:  BP Temp Temp src Pulse Resp SpO2  10/30/21 0602 119/80 98 F (36.7 C) Oral 83 15 100 %  10/29/21 2202 121/89 98.7 F (37.1 C) Oral 89 17 100 %  10/29/21 1638 111/75 98.6 F (37 C) Oral 100 18 100 %  10/29/21 1219 105/74 98.5 F (36.9 C) Oral (!) 106 18 100 %  10/29/21 0833 117/78 98.2 F (36.8 C) Oral 95 18 100 %    Physical Exam:  General: alert, cooperative, and no distress Lochia: appropriate Uterine Fundus: firm DVT Evaluation: No evidence of DVT seen on physical exam.  Recent Labs    10/28/21 0039 10/29/21 0506  WBC 5.5 14.4*  HGB 9.7* 8.9*  HCT 30.6* 28.0*  PLT 226 204     Assessment/Plan:  Jordan [name] 36 y.o. G1P1001 PPD#1 (10/29/21 at 0047) s/p SVD 1. Iron deficiency anemia: Hgb 8.9, asymptomatic, continue PO iron 2. H/o anxiety:  seeing therapist regualrly every 2 weeks.  S/p CSW consultation.  Spoke with Dr. Timothy Lasso yesterday, declines medication at this time.  Plan short interval f/u in office  Meeting all goals.  Discharge to home today.     LOS: 2 days   [facility] GEFFEL Maxen Rowland 10/30/2021, 8:28 AM

## 2021-10-30 NOTE — Discharge Summary (Signed)
Postpartum Discharge Summary  Date of Service updated 10/30/21     Patient Name: Jordan [name] DOB: 01/29/1986 MRN: [id number]  Date of admission: 10/28/2021 Delivery date:10/29/2021  Delivering provider: Derl Barrow E  Date of discharge: 10/30/2021  Admitting diagnosis: [redacted] weeks gestation of pregnancy [Z3A.39] Intrauterine pregnancy: [redacted]w[redacted]d     Secondary diagnosis:  Principal Problem:   [redacted] weeks gestation of pregnancy  Additional problems: None    Discharge diagnosis: Term Pregnancy Delivered                                              Post partum procedures: n/a Augmentation: Pitocin, Cytotec, and IP Foley Complications: None  Hospital course: Induction of Labor With Vaginal Delivery   36 y.o. yo G1P1001 at [redacted]w[redacted]d was admitted to the hospital 10/28/2021 for induction of labor.  Indication for induction: Elective.  Patient had an uncomplicated labor course as follows: Membrane Rupture Time/Date: 2:23 PM ,10/28/2021   Delivery Method:Vaginal, Spontaneous  Episiotomy: None  Lacerations:  1st degree;Perineal  Details of delivery can be found in separate delivery note.  Patient had a routine postpartum course. Patient is discharged home 10/30/21.  Newborn Data: Birth date:10/29/2021  Birth time:12:47 AM  Gender:Female  Living status:Living  Apgars:9 ,9  Weight:3340 g    Physical exam  Vitals:   10/29/21 1219 10/29/21 1638 10/29/21 2202 10/30/21 0602  BP: 105/74 111/75 121/89 119/80  Pulse: (!) 106 100 89 83  Resp: 18 18 17 15   Temp: 98.5 F (36.9 C) 98.6 F (37 C) 98.7 F (37.1 C) 98 F (36.7 C)  TempSrc: Oral Oral Oral Oral  SpO2: 100% 100% 100% 100%  Weight:      Height:       General: alert, cooperative, and no distress Lochia: appropriate Uterine Fundus: firm Incision: N/A DVT Evaluation: No evidence of DVT seen on physical exam. Labs: Lab Results  Component Value Date   WBC 14.4 (H) 10/29/2021   HGB 8.9 (L) 10/29/2021   HCT 28.0 (L) 10/29/2021    MCV 81.4 10/29/2021   PLT 204 10/29/2021      Latest Ref Rng & Units 09/27/2021    2:23 PM  CMP  Glucose 70 - 99 mg/dL 77   BUN 6 - 20 mg/dL 5   Creatinine 09/29/2021 - 2.95 mg/dL 1.88   Sodium 4.16 - 606 mmol/L 138   Potassium 3.5 - 5.1 mmol/L 3.9   Chloride 98 - 111 mmol/L 108   CO2 22 - 32 mmol/L 22   Calcium 8.9 - 10.3 mg/dL 9.2   Total Protein 6.5 - 8.1 g/dL 6.6   Total Bilirubin 0.3 - 1.2 mg/dL 0.5   Alkaline Phos 38 - 126 U/L 83   AST 15 - 41 U/L 18   ALT 0 - 44 U/L 13    Edinburgh Score:    10/30/2021    6:02 AM  Edinburgh Postnatal Depression Scale Screening Tool  I have been able to laugh and see the funny side of things. 0  I have looked forward with enjoyment to things. 0  I have blamed myself unnecessarily when things went wrong. 0  I have been anxious or worried for no good reason. 0  I have felt scared or panicky for no good reason. 0  Things have been getting on top of me. 0  I have  been so unhappy that I have had difficulty sleeping. 0  I have felt sad or miserable. 0  I have been so unhappy that I have been crying. 0  The thought of harming myself has occurred to me. 0  Edinburgh Postnatal Depression Scale Total 0      After visit meds:  Allergies as of 10/30/2021       Reactions   Ivp Dye [iodinated Contrast Media] Anaphylaxis   At [facility] 09/2019 "A CT PE was ordered, that study complicated by hypertension (SBP 180s), bradycardia to 50s, and O2 sat 88-90%. All of these abnormalities improved within seconds and she did not receive any medications."        Medication List     STOP taking these medications    aspirin EC 81 MG tablet   docusate sodium 100 MG capsule Commonly known as: COLACE       TAKE these medications    ibuprofen 600 MG tablet Commonly known as: ADVIL Take 1 tablet (600 mg total) by mouth every 6 (six) hours.   iron polysaccharides 150 MG capsule Commonly known as: NIFEREX Take 1 capsule (150 mg total) by mouth every other  day.   multivitamin-prenatal 27-0.8 MG Tabs tablet Take 1 tablet by mouth daily at 12 noon.         Discharge home in stable condition Infant Feeding: Bottle Infant Disposition:home with mother Discharge instruction: per After Visit Summary and Postpartum booklet. Activity: Advance as tolerated. Pelvic rest for 6 weeks.  Diet: routine diet Anticipated Birth Control: Unsure Postpartum Appointment:6 weeks Additional Postpartum F/U:  mood check 1-2 weeks Future Appointments: Future Appointments  Date Time Provider Department Center  12/15/2021 11:30 AM Levert Feinstein, MD [facility] None   Follow up Visit:  Follow-up Information     Charlett Nose, MD Follow up in 2 week(s).   Specialty: Obstetrics and Gynecology Why: short interval office f/u, history of anxiety Contact information: [address] [place] [address] [phone]                     10/30/2021 [facility] Lizabeth Leyden, MD

## 2021-11-07 ENCOUNTER — Telehealth (HOSPITAL_COMMUNITY): Payer: Self-pay | Admitting: *Deleted

## 2021-11-07 NOTE — Telephone Encounter (Signed)
Mom reports feeling good. No concerns about herself at this time. EPDS=0  [facility] score=0) Mom reports baby is doing well. Feeding, peeing, and pooping without difficulty. Mom reports no concerns about baby at present.  Duffy Rhody, RN 11-07-2021 at 3:21pm

## 2021-11-27 NOTE — MAU Provider Note (Signed)
S: [name] is a 36 y.o. G1P1001 at [redacted]w[redacted]d  who presents to MAU today complaining of leaking of fluid since 0400. She denies vaginal bleeding. She endorses contractions. She reports normal fetal movement.    O: BP 114/84   Pulse (!) 115   Temp 98.3 F (36.8 C) (Oral)   Resp 20   Ht 5\' 7"  (1.702 m)   Wt 82.1 kg   LMP 01/23/2021 (Exact Date)   SpO2 99%   BMI 28.35 kg/m  GENERAL: Well-developed, well-nourished female in no acute distress.  HEAD: Normocephalic, atraumatic.  CHEST: Normal effort of breathing, regular heart rate ABDOMEN: Soft, nontender, gravid PELVIC: Normal external female genitalia. Vagina is pink and rugated. Cervix with normal contour, no lesions. Normal discharge.  neg pooling.   Cervical exam:  Dilation: 1 Effacement (%): 100 Cervical Position: Posterior Station: Ballotable Exam by:: jolynn   Fetal Monitoring: Baseline: 150 Variability: mod Accelerations: present Decelerations: absent Contractions: NEFG  No results found for this or any previous visit (from the past 24 hour(s)).   A: SIUP at [redacted]w[redacted]d  Membranes intact  P: Report given to RN to contact MD on call for further instructions  [redacted]w[redacted]d, CNM 11/27/2021 7:05 AM

## 2021-12-09 IMAGING — DX DG CHEST 1V PORT
1 series · 1 of 1 positions shown · non-contrast
Comparison: Chest radiograph dated 07/04/2019.

CLINICAL DATA: 33-year-old female with shortness of breath.

EXAM:
PORTABLE CHEST 1 VIEW

[chest ap]
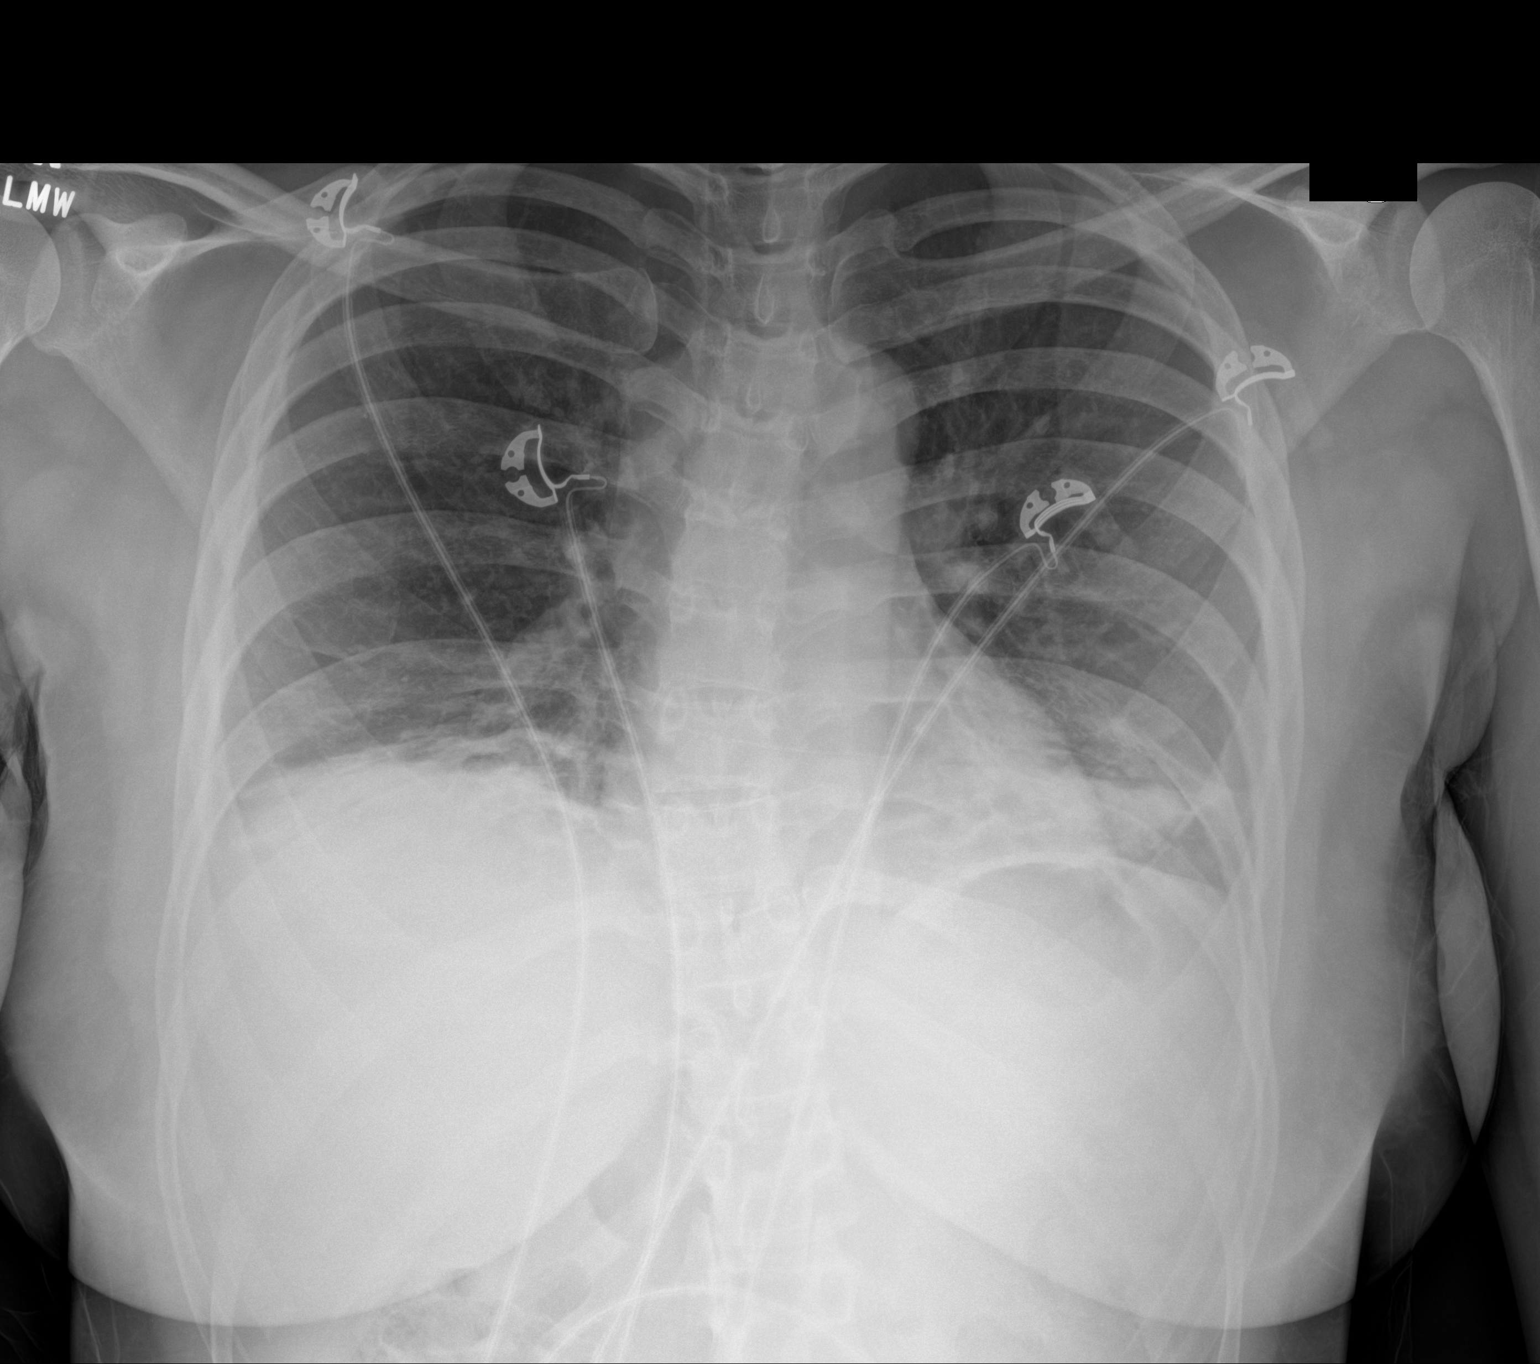

[1 of 1 positions shown; findings below may reference images not displayed]

FINDINGS: Shallow inspiration with bibasilar atelectasis. Pneumonia is less
likely. Clinical correlation is recommended. There is no pleural
effusion or pneumothorax. The cardiac silhouette is within limits.
No acute osseous pathology.
IMPRESSION: Shallow inspiration with bibasilar atelectasis, less likely
infiltrate.

## 2021-12-12 IMAGING — DX DG CHEST 1V PORT
1 series · 1 of 1 positions shown · non-contrast
Comparison: 09/28/2019

CLINICAL DATA: Recent presentation with fever, chills, shortness of
breath, and cough

EXAM:
PORTABLE CHEST 1 VIEW

[chest ap]
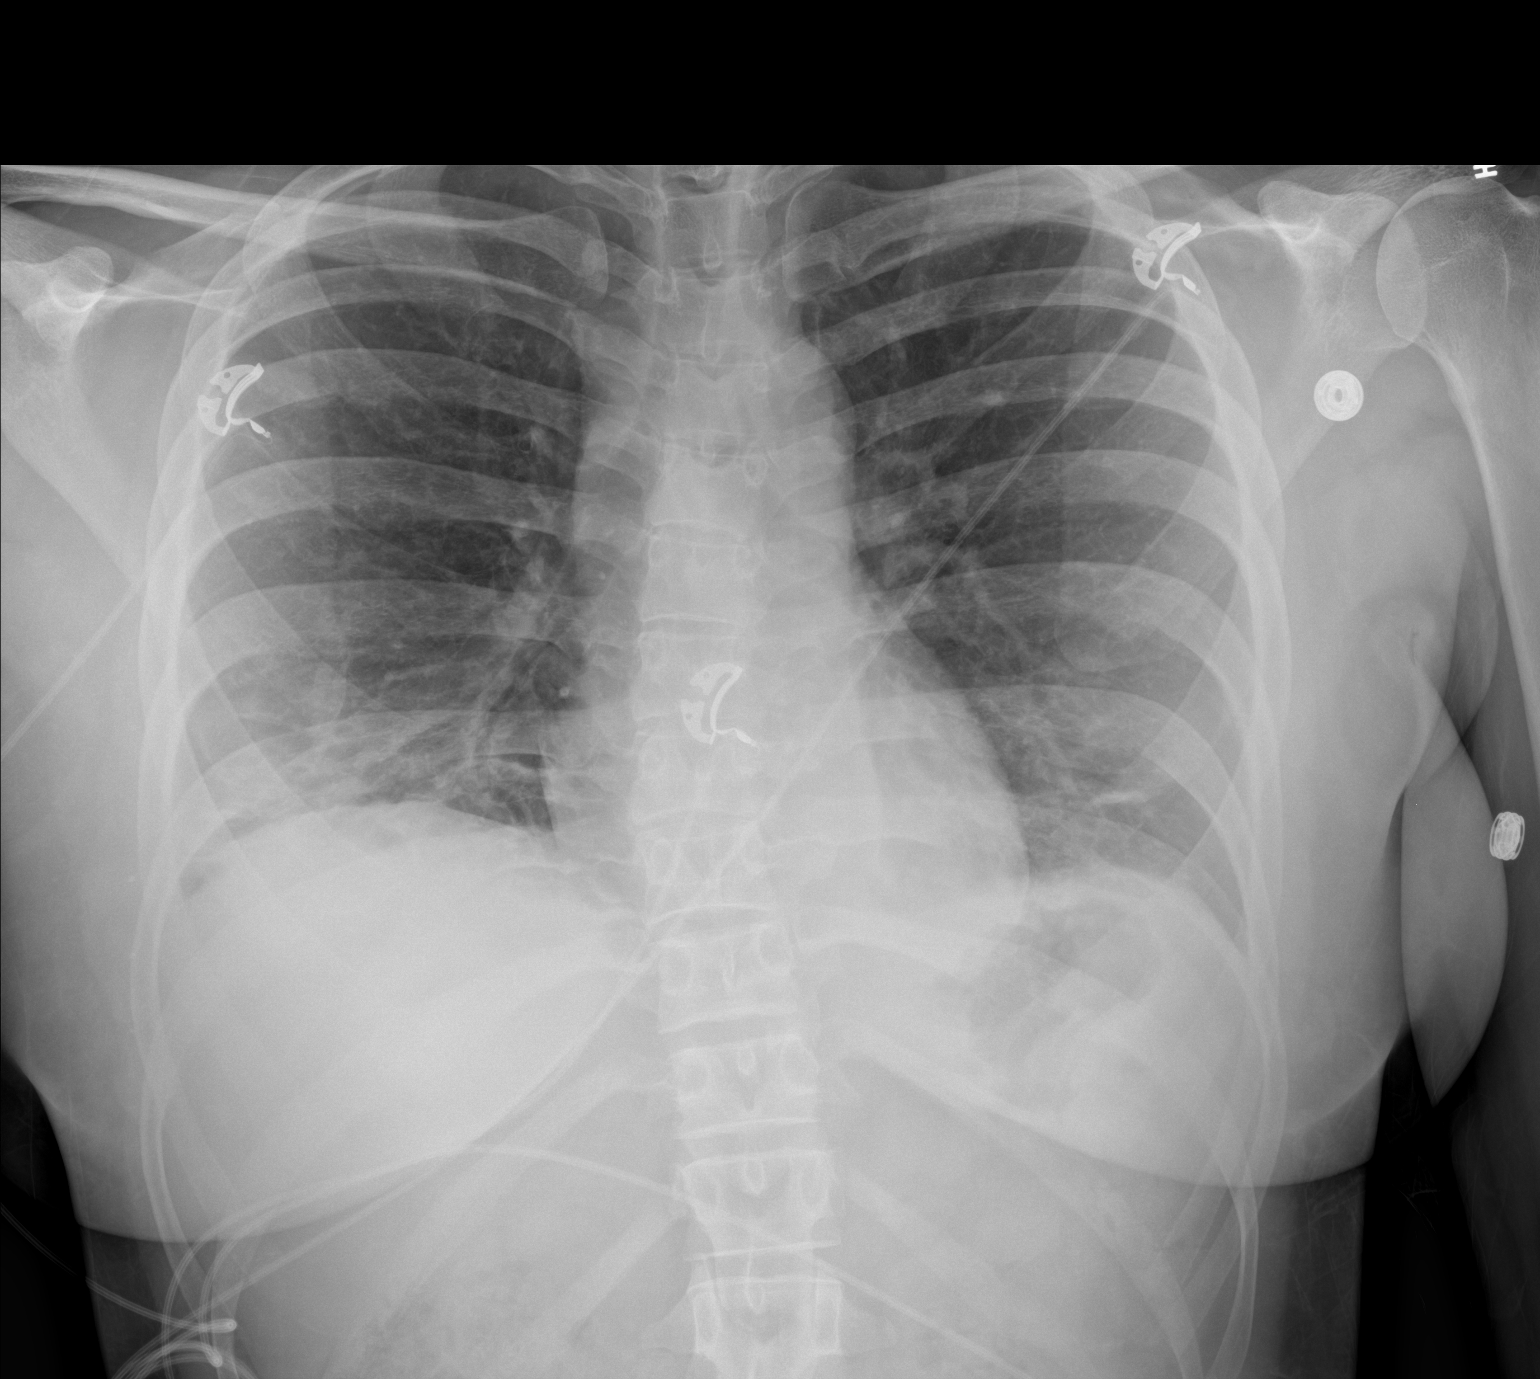

[1 of 1 positions shown; findings below may reference images not displayed]

FINDINGS: Single frontal view of the chest demonstrates a stable cardiac
silhouette. Scattered bibasilar consolidation again noted, though
overall improved aeration at the lung bases since prior study. Small
right pleural effusion again noted. There is no pneumothorax.
IMPRESSION: 1. Improved aeration of the lung bases, with persistent scattered
areas of consolidation as above. Differential remains atelectasis or
Recurrent/residual airspace disease.
2. Trace right pleural effusion.

## 2021-12-15 ENCOUNTER — Ambulatory Visit: Payer: BC Managed Care – PPO | Admitting: Neurology

## 2021-12-20 IMAGING — DX DG CHEST 1V PORT
1 series · 2 of 2 positions shown · non-contrast
Comparison: Portable chest 10/01/2019 and earlier.

CLINICAL DATA: 33-year-old female with progressive shortness of
breath today. Status post TIJPX-UP. Last year.

EXAM:
PORTABLE CHEST 1 VIEW

[Series 1: chest ap · 0.14mm/px · 2 of 2 slices shown]
[im 1/2]
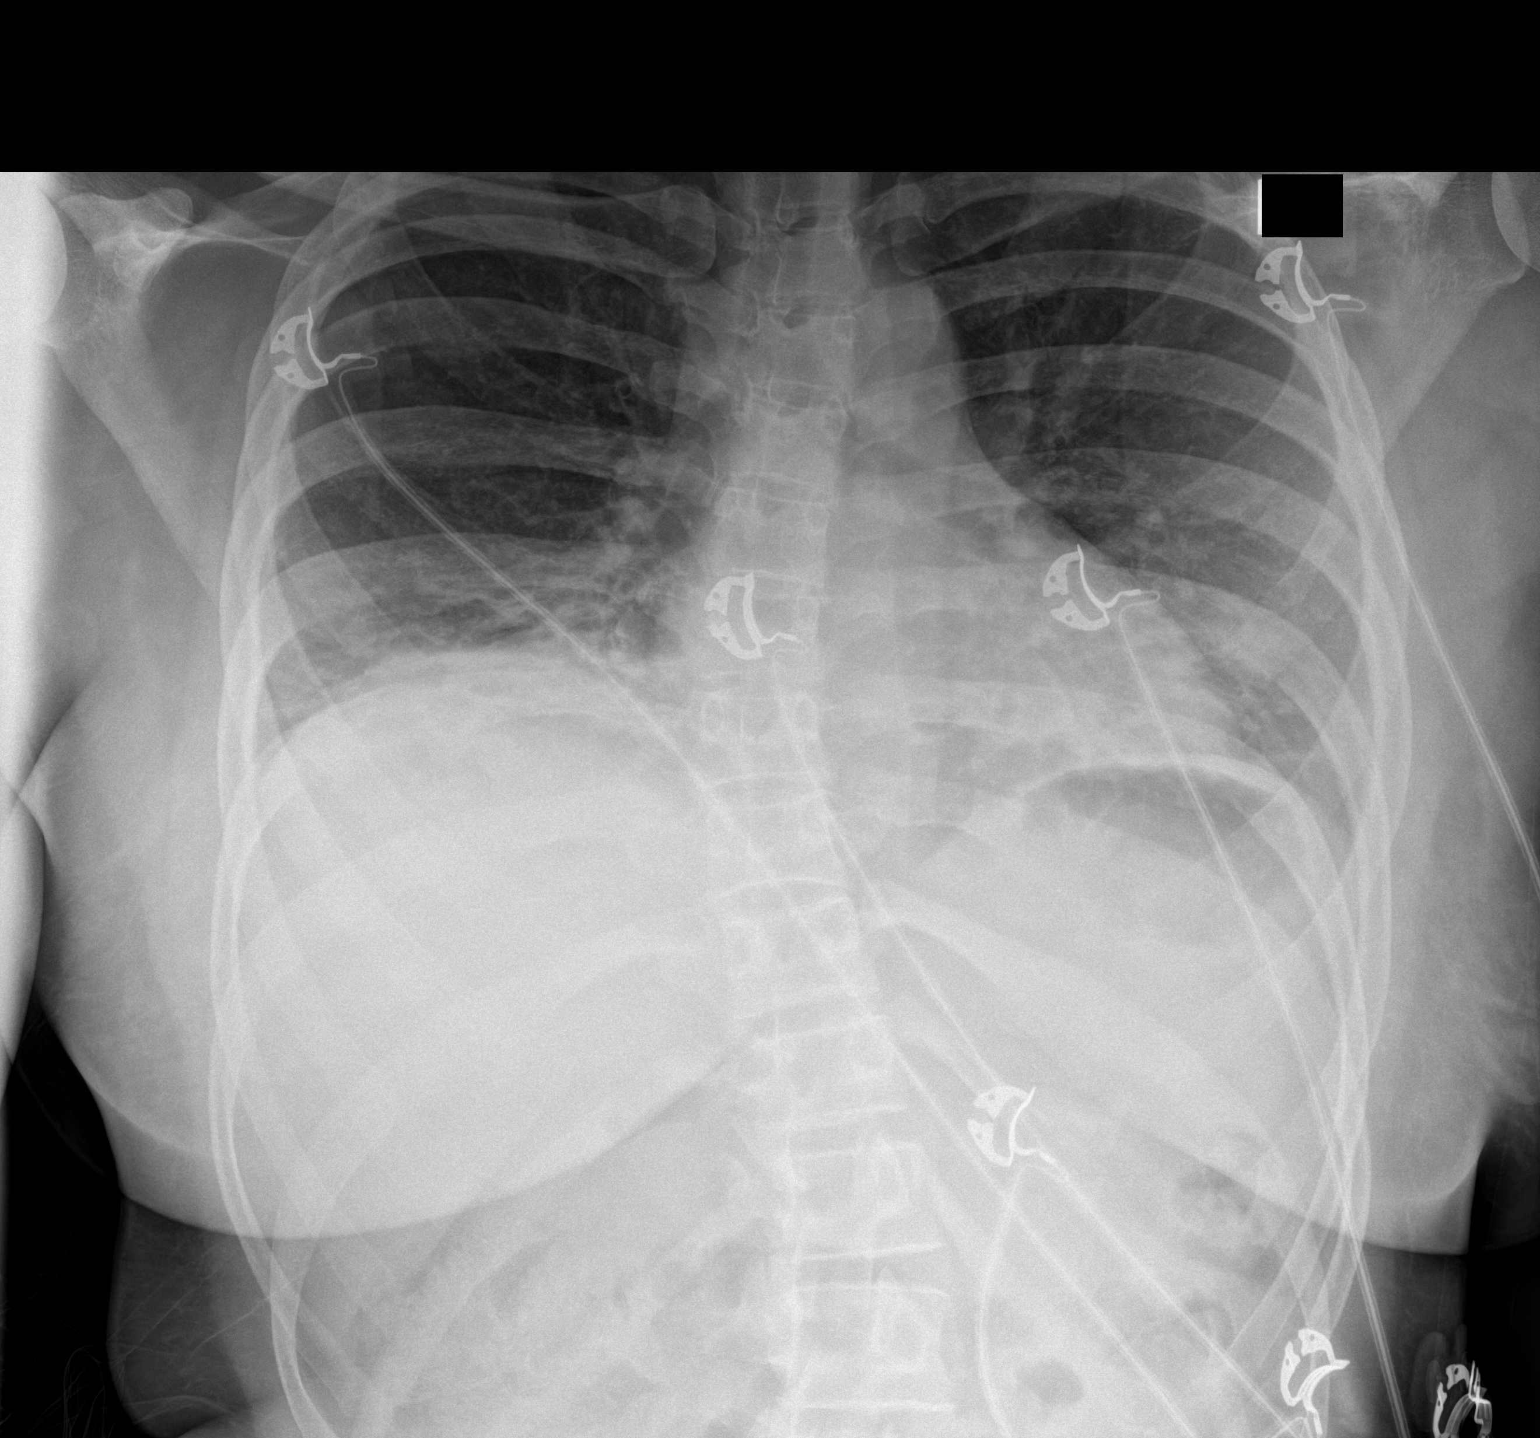
[im 2/2]
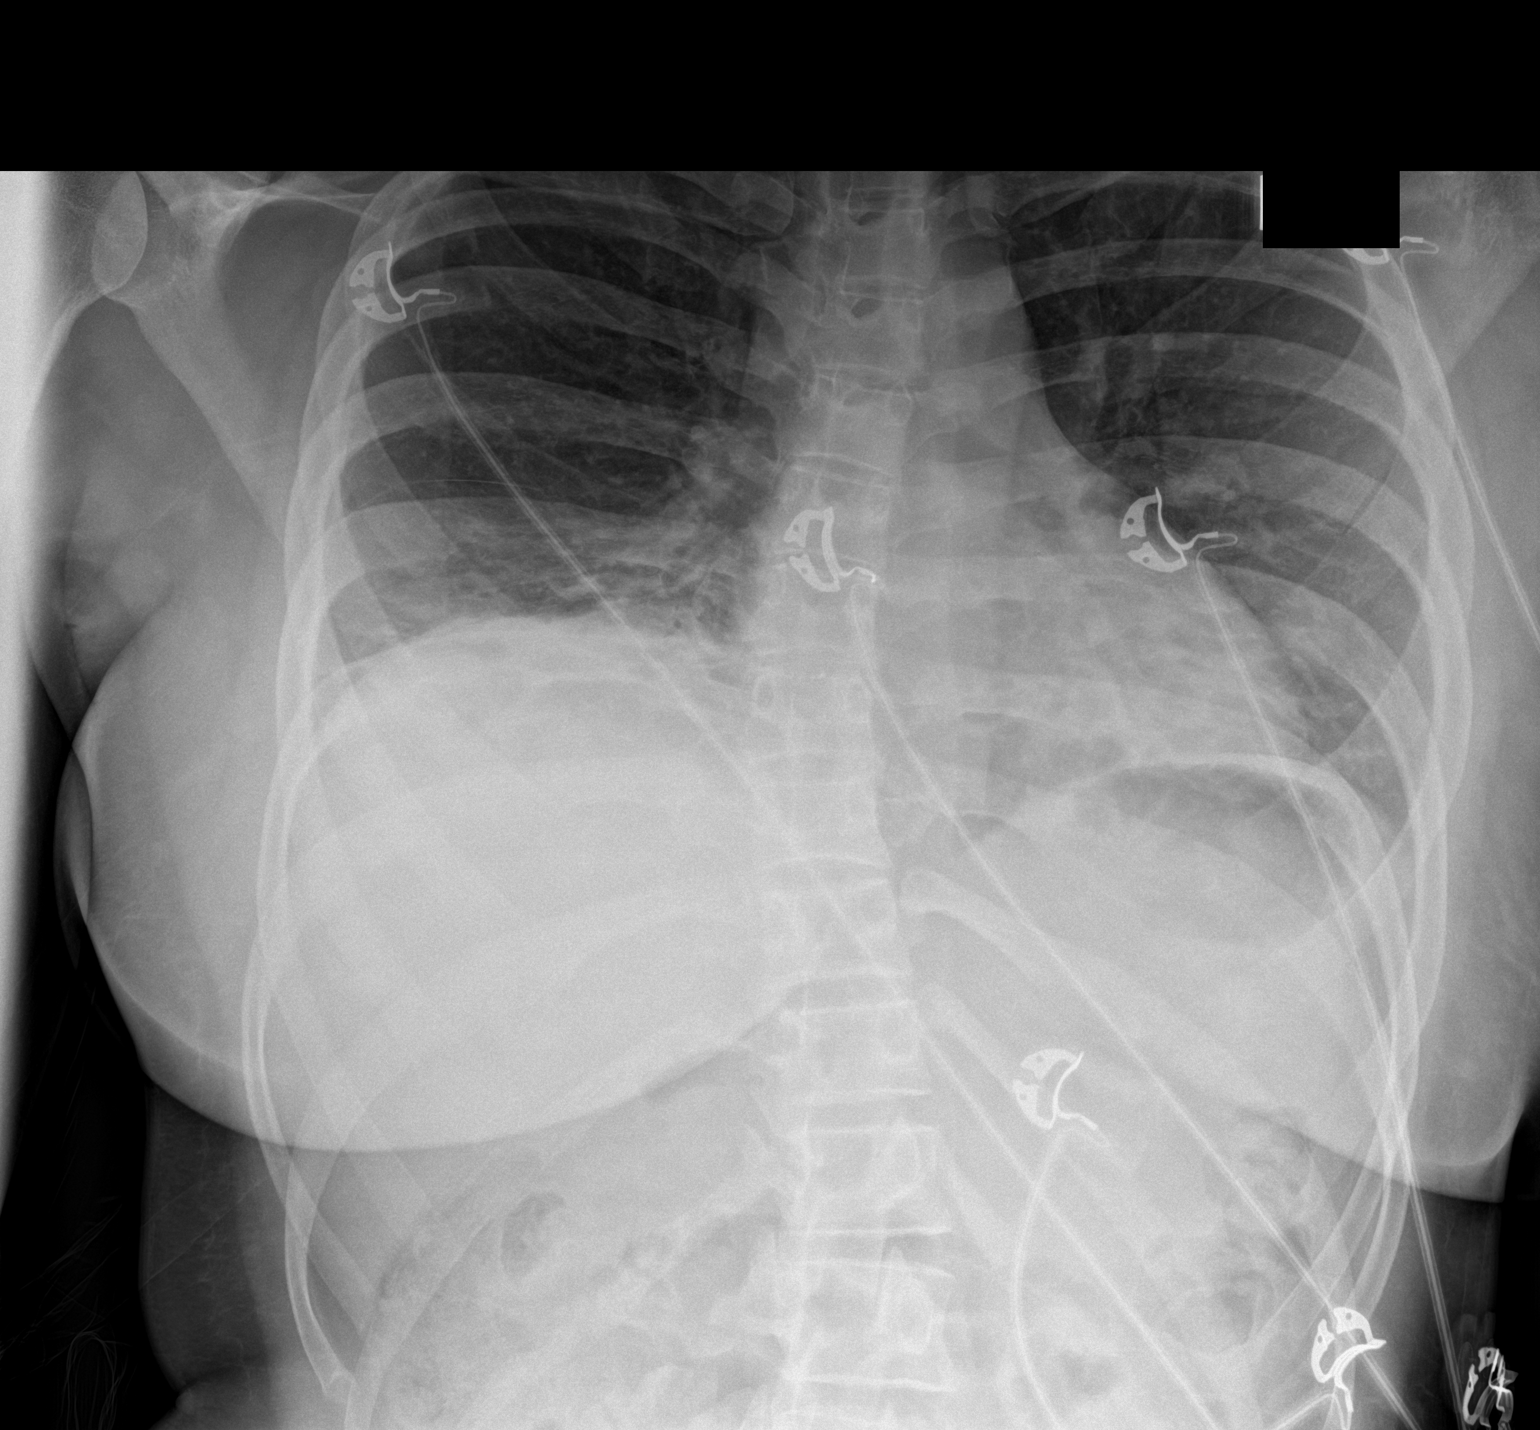

[2 of 2 positions shown; findings below may reference images not displayed]

FINDINGS: Portable AP upright view at 7554 hours. Continued low lung volumes,
lower since 10/01/2019. Coarse and patchy bibasilar pulmonary
opacity appears unchanged from chest radiographs and CTA and may
compatible with atelectasis or scarring. Elsewhere lungs appear
clear. No pneumothorax. Normal cardiac size and mediastinal
contours. Visualized tracheal air column is within normal limits.

Mild scoliosis. No acute osseous abnormality identified. Negative
visible bowel gas pattern.
IMPRESSION: Lower lung volumes with continued confluent basilar atelectasis or
scarring. No new cardiopulmonary abnormality.

## 2021-12-27 ENCOUNTER — Ambulatory Visit: Payer: BC Managed Care – PPO | Admitting: Neurology

## 2021-12-27 ENCOUNTER — Encounter: Payer: Self-pay | Admitting: Neurology

## 2021-12-29 ENCOUNTER — Ambulatory Visit: Payer: BC Managed Care – PPO | Admitting: Neurology

## 2022-04-20 ENCOUNTER — Inpatient Hospital Stay: Payer: Medicaid Other | Attending: Oncology | Admitting: Oncology

## 2022-04-20 ENCOUNTER — Inpatient Hospital Stay: Payer: Medicaid Other

## 2022-04-20 ENCOUNTER — Encounter: Payer: Self-pay | Admitting: Oncology

## 2022-04-20 VITALS — BP 141/99 | HR 99 | Temp 97.8°F | Wt 166.1 lb

## 2022-04-20 DIAGNOSIS — D708 Other neutropenia: Secondary | ICD-10-CM

## 2022-04-20 DIAGNOSIS — F53 Postpartum depression: Secondary | ICD-10-CM | POA: Diagnosis not present

## 2022-04-20 DIAGNOSIS — Z79899 Other long term (current) drug therapy: Secondary | ICD-10-CM | POA: Insufficient documentation

## 2022-04-20 DIAGNOSIS — Z803 Family history of malignant neoplasm of breast: Secondary | ICD-10-CM | POA: Insufficient documentation

## 2022-04-20 DIAGNOSIS — Z91041 Radiographic dye allergy status: Secondary | ICD-10-CM | POA: Insufficient documentation

## 2022-04-20 DIAGNOSIS — Z800 Family history of malignant neoplasm of digestive organs: Secondary | ICD-10-CM | POA: Diagnosis not present

## 2022-04-20 DIAGNOSIS — H538 Other visual disturbances: Secondary | ICD-10-CM | POA: Insufficient documentation

## 2022-04-20 DIAGNOSIS — D72819 Decreased white blood cell count, unspecified: Secondary | ICD-10-CM | POA: Insufficient documentation

## 2022-04-20 DIAGNOSIS — R413 Other amnesia: Secondary | ICD-10-CM | POA: Insufficient documentation

## 2022-04-20 DIAGNOSIS — Z8616 Personal history of COVID-19: Secondary | ICD-10-CM | POA: Diagnosis not present

## 2022-04-20 DIAGNOSIS — R5383 Other fatigue: Secondary | ICD-10-CM | POA: Insufficient documentation

## 2022-04-20 DIAGNOSIS — R42 Dizziness and giddiness: Secondary | ICD-10-CM | POA: Diagnosis not present

## 2022-04-20 DIAGNOSIS — Z8249 Family history of ischemic heart disease and other diseases of the circulatory system: Secondary | ICD-10-CM | POA: Diagnosis not present

## 2022-04-20 LAB — CBC WITH DIFFERENTIAL/PLATELET
Abs Immature Granulocytes: 0 10*3/uL (ref 0.00–0.07)
Basophils Absolute: 0 10*3/uL (ref 0.0–0.1)
Basophils Relative: 0 %
Eosinophils Absolute: 0.1 10*3/uL (ref 0.0–0.5)
Eosinophils Relative: 2 %
HCT: 36.1 % (ref 36.0–46.0)
Hemoglobin: 11.5 g/dL — ABNORMAL LOW (ref 12.0–15.0)
Immature Granulocytes: 0 %
Lymphocytes Relative: 42 %
Lymphs Abs: 1.2 10*3/uL (ref 0.7–4.0)
MCH: 26.4 pg (ref 26.0–34.0)
MCHC: 31.9 g/dL (ref 30.0–36.0)
MCV: 82.8 fL (ref 80.0–100.0)
Monocytes Absolute: 0.3 10*3/uL (ref 0.1–1.0)
Monocytes Relative: 12 %
Neutro Abs: 1.3 10*3/uL — ABNORMAL LOW (ref 1.7–7.7)
Neutrophils Relative %: 44 %
Platelets: 276 10*3/uL (ref 150–400)
RBC: 4.36 MIL/uL (ref 3.87–5.11)
RDW: 13.4 % (ref 11.5–15.5)
WBC: 2.9 10*3/uL — ABNORMAL LOW (ref 4.0–10.5)
nRBC: 0 % (ref 0.0–0.2)

## 2022-04-20 LAB — LACTATE DEHYDROGENASE: LDH: 151 U/L (ref 98–192)

## 2022-04-20 LAB — FOLATE: Folate: 9.2 ng/mL (ref >5.9)

## 2022-04-20 LAB — TECHNOLOGIST SMEAR REVIEW: Plt Morphology: ADEQUATE

## 2022-04-20 LAB — VITAMIN B12: Vitamin B-12: 195 pg/mL (ref 180–914)

## 2022-04-20 NOTE — Progress Notes (Unsigned)
Patient is referred here by Dr. Danie Chandler for neutropenia.

## 2022-04-21 LAB — HEPATITIS PANEL, ACUTE
HCV Ab: NONREACTIVE
Hep A IgM: NONREACTIVE
Hep B C IgM: NONREACTIVE
Hepatitis B Surface Ag: NONREACTIVE

## 2022-04-21 LAB — HIV ANTIBODY (ROUTINE TESTING W REFLEX): HIV Screen 4th Generation wRfx: NONREACTIVE

## 2022-04-22 DIAGNOSIS — D708 Other neutropenia: Secondary | ICD-10-CM | POA: Insufficient documentation

## 2022-04-22 NOTE — Progress Notes (Signed)
Hematology/Oncology Consult Note [phone] [phone]      Patient Care Team: Kerri Perches, Hershal Coria as PCP - General (Physician Assistant)  REFERRING PROVIDER: Verlin Dike, MD  CHIEF COMPLAINTS/REASON FOR VISIT:  Evaluation of leukopenia  HISTORY OF PRESENTING ILLNESS:  Jordan [name] is a 35 y.o. female who was seen in consultation at the request of Cronk, Angelique Holm, MD for evaluation of leukopenia.  Patient has low total WBC count of 2.2, neutropenia ANC 1.1, hemoglobin 11.6, mcv 83,  Previous lab records reviewed. Leukopenia duration is acute onset.  Patient reports dizziness,she has postpartum depression, currently she is on Buspar.  Also reports loss of memory and decreased vision after her COVID 19 infection 3 years ago.  + night sweat, 2-3 times per week.     MEDICAL HISTORY:  Past Medical History:  Diagnosis Date   Anxiety    Asthma    Blood transfusion without reported diagnosis    COVID-19 09/02/2018   COVID-19 long hauler manifesting chronic dyspnea    Depression    HPV (human papilloma virus) infection    Infertility management    Ovarian cyst    Seizures (HCC)    Tachycardia    after covid, saw cardiologist    SURGICAL HISTORY: Past Surgical History:  Procedure Laterality Date   NO PAST SURGERIES      SOCIAL HISTORY: Social History   Socioeconomic History   Marital status: Married    Spouse name: Not on file   Number of children: Not on file   Years of education: Not on file   Highest education level: Not on file  Occupational History   Not on file  Tobacco Use   Smoking status: Never   Smokeless tobacco: Never  Vaping Use   Vaping Use: Never used  Substance and Sexual Activity   Alcohol use: No   Drug use: No   Sexual activity: Not Currently    Birth control/protection: None  Other Topics Concern   Not on file  Social History Narrative   Not on file   Social Determinants of Health   Financial  Resource Strain: Not on file  Food Insecurity: Not on file  Transportation Needs: Not on file  Physical Activity: Not on file  Stress: Not on file  Social Connections: Not on file  Intimate Partner Violence: Not on file    FAMILY HISTORY: Family History  Problem Relation Age of Onset   Stomach cancer Paternal Grandmother    Breast cancer Maternal Grandmother    Healthy Father    Healthy Mother    Migraines Mother     ALLERGIES:  is allergic to ivp dye [iodinated contrast media].  MEDICATIONS:  Current Outpatient Medications  Medication Sig Dispense Refill   acetaminophen (TYLENOL) 325 MG tablet Take 325 mg by mouth every 6 (six) hours as needed.     ibuprofen (ADVIL) 600 MG tablet Take 1 tablet (600 mg total) by mouth every 6 (six) hours. (Patient not taking: Reported on 04/20/2022) 60 tablet 0   iron polysaccharides (NIFEREX) 150 MG capsule Take 1 capsule (150 mg total) by mouth every other day. (Patient not taking: Reported on 04/20/2022) 15 capsule 0   Prenatal Vit-Fe Fumarate-FA (MULTIVITAMIN-PRENATAL) 27-0.8 MG TABS tablet Take 1 tablet by mouth daily at 12 noon. (Patient not taking: Reported on 04/20/2022)     No current facility-administered medications for this visit.    Review of Systems  Constitutional:  Positive for fatigue. Negative for appetite change,  chills, fever and unexpected weight change.  HENT:   Negative for hearing loss and voice change.   Eyes:  Negative for eye problems.  Respiratory:  Negative for chest tightness and cough.   Cardiovascular:  Negative for chest pain.  Gastrointestinal:  Negative for abdominal distention, abdominal pain and blood in stool.  Endocrine: Negative for hot flashes.  Genitourinary:  Negative for difficulty urinating and frequency.   Musculoskeletal:  Negative for arthralgias.  Skin:  Negative for itching and rash.  Neurological:  Positive for dizziness. Negative for extremity weakness.  Hematological:  Negative for  adenopathy.  Psychiatric/Behavioral:  Negative for confusion.     PHYSICAL EXAMINATION: Vitals:   04/20/22 1239  BP: (!) 141/99  Pulse: 99  Temp: 97.8 F (36.6 C)  SpO2: 99%   Filed Weights   04/20/22 1239  Weight: 166 lb 1.6 oz (75.3 kg)    Physical Exam Constitutional:      General: She is not in acute distress. HENT:     Head: Normocephalic and atraumatic.  Eyes:     General: No scleral icterus. Cardiovascular:     Rate and Rhythm: Normal rate and regular rhythm.     Heart sounds: Normal heart sounds.  Pulmonary:     Effort: Pulmonary effort is normal. No respiratory distress.     Breath sounds: No wheezing.  Abdominal:     General: Bowel sounds are normal. There is no distension.     Palpations: Abdomen is soft.  Musculoskeletal:        General: No deformity. Normal range of motion.     Cervical back: Normal range of motion and neck supple.  Skin:    General: Skin is warm and dry.     Findings: No erythema or rash.  Neurological:     Mental Status: She is alert and oriented to person, place, and time. Mental status is at baseline.     Cranial Nerves: No cranial nerve deficit.     Coordination: Coordination normal.  Psychiatric:        Mood and Affect: Mood normal.      LABORATORY DATA:  I have reviewed the data as listed     Latest Ref Rng & Units 04/20/2022    1:12 PM 10/29/2021    5:06 AM 10/28/2021   12:39 AM  CBC  WBC 4.0 - 10.5 K/uL 2.9  14.4  5.5   Hemoglobin 12.0 - 15.0 g/dL 11.5  8.9  9.7   Hematocrit 36.0 - 46.0 % 36.1  28.0  30.6   Platelets 150 - 400 K/uL 276  204  226       Latest Ref Rng & Units 09/27/2021    2:23 PM 08/27/2021   12:49 PM 05/30/2021   11:23 AM  CMP  Glucose 70 - 99 mg/dL 77  105  80   BUN 6 - 20 mg/dL _0 Creatinine 0.44 - 1.00 mg/dL 0.86  0.79  0.74   Sodium 135 - 145 mmol/L 138  136  135   Potassium 3.5 - 5.1 mmol/L 3.9  3.8  3.8   Chloride 98 - 111 mmol/L 108  106  105   CO2 22 - 32 mmol/L _1 Calcium 8.9 - 10.3 mg/dL 9.2  9.2  9.4   Total Protein 6.5 - 8.1 g/dL 6.6  6.5  7.9   Total Bilirubin 0.3 - 1.2 mg/dL 0.5  0.7  0.9  Alkaline Phos 38 - 126 U/L 83  63  46   AST 15 - 41 U/L _0 ALT 0 - 44 U/L _1 RADIOGRAPHIC STUDIES: I have personally reviewed the radiological images as listed and agreed with the findings in the report. No results found.    ASSESSMENT & PLAN:   No problem-specific Assessment & Plan notes found for this encounter.     # Patient follow-up with me in approximately 3-4 weeks to review the above results.   Orders Placed This Encounter  Procedures   Vitamin B12    Standing Status:   Future    Number of Occurrences:   1    Standing Expiration Date:   04/21/2023   Folate    Standing Status:   Future    Number of Occurrences:   1    Standing Expiration Date:   04/21/2023   CBC with Differential/Platelet    Standing Status:   Future    Number of Occurrences:   1    Standing Expiration Date:   04/21/2023   Multiple Myeloma Panel (SPEP&IFE w/QIG)    Standing Status:   Future    Number of Occurrences:   1    Standing Expiration Date:   04/21/2023   Kappa/lambda light chains    Standing Status:   Future    Number of Occurrences:   1    Standing Expiration Date:   04/21/2023   Flow cytometry panel-leukemia/lymphoma work-up    Standing Status:   Future    Number of Occurrences:   1    Standing Expiration Date:   04/21/2023   ANA, IFA (with reflex)    Standing Status:   Future    Number of Occurrences:   1    Standing Expiration Date:   04/21/2023   HIV Antibody (routine testing w rflx)    Standing Status:   Future    Number of Occurrences:   1    Standing Expiration Date:   04/21/2023   Hepatitis panel, acute    Standing Status:   Future    Number of Occurrences:   1    Standing Expiration Date:   04/21/2023   Lactate dehydrogenase    Standing Status:   Future    Number of Occurrences:   1    Standing Expiration  Date:   04/21/2023   Technologist smear review    Standing Status:   Future    Number of Occurrences:   1    Standing Expiration Date:   04/21/2023    Order Specific Question:   Clinical information:    Answer:   neutropenia   Follow up in a few weeks to review results.  All questions were answered. The patient knows to call the clinic with any problems questions or concerns.  Cc Cronk, Angelique Holm, MD  Thank you for this kind referral and the opportunity to participate in the care of this patient. A copy of today's note is routed to referring provider      Earlie Server, MD, PhD Hematology Oncology 04/20/2022

## 2022-04-22 NOTE — Assessment & Plan Note (Signed)
I discussed with patient that the differential diagnosis of leukopenia is broad, including acute or chronic infection, inflammation, nutrition deficiency, autoimmune disease,  ethnic, or malignant etiology including underlying bone morrow disorders.  For the work up of patient's leukoepenia, I recommend checking CBC;CMP, LDH; smear review, folate, Vitamin B12, hepatitis, HIV, ANA, flowcytometry and monoclonal gammopathy workup.

## 2022-04-24 LAB — KAPPA/LAMBDA LIGHT CHAINS
Kappa free light chain: 31.3 mg/L — ABNORMAL HIGH (ref 3.3–19.4)
Kappa, lambda light chain ratio: 1.8 — ABNORMAL HIGH (ref 0.26–1.65)
Lambda free light chains: 17.4 mg/L (ref 5.7–26.3)

## 2022-04-25 LAB — COMP PANEL: LEUKEMIA/LYMPHOMA

## 2022-04-25 LAB — ANTINUCLEAR ANTIBODIES, IFA: ANA Ab, IFA: NEGATIVE

## 2022-04-27 LAB — MULTIPLE MYELOMA PANEL, SERUM
Albumin SerPl Elph-Mcnc: 3.9 g/dL (ref 2.9–4.4)
Albumin/Glob SerPl: 1.2 (ref 0.7–1.7)
Alpha 1: 0.3 g/dL (ref 0.0–0.4)
Alpha2 Glob SerPl Elph-Mcnc: 0.7 g/dL (ref 0.4–1.0)
B-Globulin SerPl Elph-Mcnc: 1 g/dL (ref 0.7–1.3)
Gamma Glob SerPl Elph-Mcnc: 1.5 g/dL (ref 0.4–1.8)
Globulin, Total: 3.4 g/dL (ref 2.2–3.9)
IgA: 123 mg/dL (ref 87–352)
IgG (Immunoglobin G), Serum: 1412 mg/dL (ref 586–1602)
IgM (Immunoglobulin M), Srm: 181 mg/dL (ref 26–217)
Total Protein ELP: 7.3 g/dL (ref 6.0–8.5)

## 2022-05-25 ENCOUNTER — Encounter: Payer: Self-pay | Admitting: Oncology

## 2022-05-25 ENCOUNTER — Inpatient Hospital Stay: Payer: Medicaid Other | Attending: Oncology | Admitting: Oncology

## 2022-05-25 VITALS — BP 134/105 | HR 91 | Temp 97.0°F | Wt 163.1 lb

## 2022-05-25 DIAGNOSIS — R42 Dizziness and giddiness: Secondary | ICD-10-CM | POA: Insufficient documentation

## 2022-05-25 DIAGNOSIS — F53 Postpartum depression: Secondary | ICD-10-CM | POA: Insufficient documentation

## 2022-05-25 DIAGNOSIS — R413 Other amnesia: Secondary | ICD-10-CM | POA: Insufficient documentation

## 2022-05-25 DIAGNOSIS — Z79899 Other long term (current) drug therapy: Secondary | ICD-10-CM | POA: Insufficient documentation

## 2022-05-25 DIAGNOSIS — Z8616 Personal history of COVID-19: Secondary | ICD-10-CM | POA: Insufficient documentation

## 2022-05-25 DIAGNOSIS — Z800 Family history of malignant neoplasm of digestive organs: Secondary | ICD-10-CM | POA: Diagnosis not present

## 2022-05-25 DIAGNOSIS — D708 Other neutropenia: Secondary | ICD-10-CM

## 2022-05-25 DIAGNOSIS — H538 Other visual disturbances: Secondary | ICD-10-CM | POA: Diagnosis not present

## 2022-05-25 DIAGNOSIS — D709 Neutropenia, unspecified: Secondary | ICD-10-CM | POA: Diagnosis not present

## 2022-05-25 DIAGNOSIS — R5383 Other fatigue: Secondary | ICD-10-CM | POA: Diagnosis not present

## 2022-05-25 DIAGNOSIS — Z8249 Family history of ischemic heart disease and other diseases of the circulatory system: Secondary | ICD-10-CM | POA: Diagnosis not present

## 2022-05-25 DIAGNOSIS — E538 Deficiency of other specified B group vitamins: Secondary | ICD-10-CM | POA: Diagnosis not present

## 2022-05-25 DIAGNOSIS — D72819 Decreased white blood cell count, unspecified: Secondary | ICD-10-CM | POA: Insufficient documentation

## 2022-05-25 DIAGNOSIS — Z91041 Radiographic dye allergy status: Secondary | ICD-10-CM | POA: Diagnosis not present

## 2022-05-25 DIAGNOSIS — Z803 Family history of malignant neoplasm of breast: Secondary | ICD-10-CM | POA: Insufficient documentation

## 2022-05-25 NOTE — Assessment & Plan Note (Addendum)
Labs are reviewed and discussed with patient. normal LDH, low normal end Vitamin B12, negative hepatitis, negative HIV, negative ANA, negative flowcytometry and no M protein on SPEP, mildly elevated light chain ration, non specific.    Recommend patient to get a B12 injections to see if improvement of B12 may help with the neutropenia level.  She agrees with the plan.

## 2022-05-25 NOTE — Progress Notes (Signed)
Hematology/Oncology Progress note [phone] [phone]         Patient Care Team: Cyndi Bender as PCP - General (Physician Assistant)  REFERRING PROVIDER: Marya Fossa, PA-C  ASSESSMENT & PLAN:   Other neutropenia [facility]) Labs are reviewed and discussed with patient. normal LDH, low normal end Vitamin B12, negative hepatitis, negative HIV, negative ANA, negative flowcytometry and no M protein on SPEP, mildly elevated light chain ration, non specific.    Recommend patient to get a B12 injections to see if improvement of B12 may help with the neutropenia level.  She agrees with the plan.  Vitamin B12 deficiency Recommend B12 1000 mcg IM weekly x 4.  Orders Placed This Encounter  Procedures   CBC with Differential/Platelet    Standing Status:   Future    Standing Expiration Date:   05/25/2023   Vitamin B12    Standing Status:   Future    Standing Expiration Date:   05/26/2023   Follow-up in 6 weeks. All questions were answered. The patient knows to call the clinic with any problems, questions or concerns.  Rickard Patience, MD, PhD [facility] Hematology Oncology 05/25/2022     CHIEF COMPLAINTS/REASON FOR VISIT:   leukopenia  HISTORY OF PRESENTING ILLNESS:  Jordan [name] is a 37 y.o. female who was seen in consultation at the request of Marya Fossa, PA-C for evaluation of leukopenia.  Patient has low total WBC count of 2.2, neutropenia ANC 1.1, hemoglobin 11.6, mcv 83,  Previous lab records reviewed. Leukopenia duration is acute onset.  Patient reports dizziness,she has postpartum depression, currently she is on Buspar.  Also reports loss of memory and decreased vision after her COVID 19 infection 3 years ago.  + night sweat, 2-3 times per week.   INTERVAL HISTORY Jordan [name] is a 37 y.o. female who has above history reviewed by me today presents for follow up visit for neutropenia Patient continues to feel tired.  She does not sleep  well due to having to take care of her baby at night.   MEDICAL HISTORY:  Past Medical History:  Diagnosis Date   Anxiety    Asthma    Blood transfusion without reported diagnosis    COVID-19 09/02/2018   COVID-19 long hauler manifesting chronic dyspnea    Depression    HPV (human papilloma virus) infection    Infertility management    Ovarian cyst    Seizures ([facility])    Tachycardia    after covid, saw cardiologist    SURGICAL HISTORY: Past Surgical History:  Procedure Laterality Date   NO PAST SURGERIES      SOCIAL HISTORY: Social History   Socioeconomic History   Marital status: Married    Spouse name: Not on file   Number of children: Not on file   Years of education: Not on file   Highest education level: Not on file  Occupational History   Not on file  Tobacco Use   Smoking status: Never   Smokeless tobacco: Never  Vaping Use   Vaping Use: Never used  Substance and Sexual Activity   Alcohol use: No   Drug use: No   Sexual activity: Not Currently    Birth control/protection: None  Other Topics Concern   Not on file  Social History Narrative   Not on file   Social Determinants of Health   Financial Resource Strain: Not on file  Food Insecurity: Not on file  Transportation Needs: Not on file  Physical Activity: Not on file  Stress: Not on file  Social Connections: Not on file  Intimate Partner Violence: Not on file    FAMILY HISTORY: Family History  Problem Relation Age of Onset   Stomach cancer Paternal Grandmother    Breast cancer Maternal Grandmother    Healthy Father    Healthy Mother    Migraines Mother     ALLERGIES:  is allergic to ivp dye [iodinated contrast media].  MEDICATIONS:  Current Outpatient Medications  Medication Sig Dispense Refill   acetaminophen (TYLENOL) 325 MG tablet Take 325 mg by mouth every 6 (six) hours as needed.     ibuprofen (ADVIL) 600 MG tablet Take 1 tablet (600 mg total) by mouth every 6 (six) hours.  (Patient not taking: Reported on 04/20/2022) 60 tablet 0   iron polysaccharides (NIFEREX) 150 MG capsule Take 1 capsule (150 mg total) by mouth every other day. (Patient not taking: Reported on 04/20/2022) 15 capsule 0   Prenatal Vit-Fe Fumarate-FA (MULTIVITAMIN-PRENATAL) 27-0.8 MG TABS tablet Take 1 tablet by mouth daily at 12 noon. (Patient not taking: Reported on 04/20/2022)     No current facility-administered medications for this visit.    Review of Systems  Constitutional:  Positive for fatigue. Negative for appetite change, chills, fever and unexpected weight change.  HENT:   Negative for hearing loss and voice change.   Eyes:  Negative for eye problems.  Respiratory:  Negative for chest tightness and cough.   Cardiovascular:  Negative for chest pain.  Gastrointestinal:  Negative for abdominal distention, abdominal pain and blood in stool.  Endocrine: Negative for hot flashes.  Genitourinary:  Negative for difficulty urinating and frequency.   Musculoskeletal:  Negative for arthralgias.  Skin:  Negative for itching and rash.  Neurological:  Positive for dizziness. Negative for extremity weakness.  Hematological:  Negative for adenopathy.  Psychiatric/Behavioral:  Negative for confusion.     PHYSICAL EXAMINATION: Vitals:   05/25/22 1145  BP: (!) 134/105  Pulse: 91  Temp: (!) 97 F (36.1 C)  SpO2: 98%   Filed Weights   05/25/22 1145  Weight: 163 lb 1.6 oz (74 kg)    Physical Exam Constitutional:      General: She is not in acute distress. HENT:     Head: Normocephalic and atraumatic.  Eyes:     General: No scleral icterus. Cardiovascular:     Rate and Rhythm: Normal rate and regular rhythm.     Heart sounds: Normal heart sounds.  Pulmonary:     Effort: Pulmonary effort is normal. No respiratory distress.     Breath sounds: No wheezing.  Abdominal:     General: Bowel sounds are normal. There is no distension.     Palpations: Abdomen is soft.  Musculoskeletal:         General: No deformity. Normal range of motion.     Cervical back: Normal range of motion and neck supple.  Skin:    General: Skin is warm and dry.     Findings: No erythema or rash.  Neurological:     Mental Status: She is alert and oriented to person, place, and time. Mental status is at baseline.     Cranial Nerves: No cranial nerve deficit.     Coordination: Coordination normal.  Psychiatric:        Mood and Affect: Mood normal.      LABORATORY DATA:  I have reviewed the data as listed     Latest Ref Rng &  Units 04/20/2022    1:12 PM 10/29/2021    5:06 AM 10/28/2021   12:39 AM  CBC  WBC 4.0 - 10.5 K/uL 2.9  14.4  5.5   Hemoglobin 12.0 - 15.0 g/dL 11.5  8.9  9.7   Hematocrit 36.0 - 46.0 % 36.1  28.0  30.6   Platelets 150 - 400 K/uL 276  204  226       Latest Ref Rng & Units 09/27/2021    2:23 PM 08/27/2021   12:49 PM 05/30/2021   11:23 AM  CMP  Glucose 70 - 99 mg/dL 77  105  80   BUN 6 - 20 mg/dL 5  7  10    Creatinine 0.44 - 1.00 mg/dL 0.86  0.79  0.74   Sodium 135 - 145 mmol/L 138  136  135   Potassium 3.5 - 5.1 mmol/L 3.9  3.8  3.8   Chloride 98 - 111 mmol/L 108  106  105   CO2 22 - 32 mmol/L 22  22  18    Calcium 8.9 - 10.3 mg/dL 9.2  9.2  9.4   Total Protein 6.5 - 8.1 g/dL 6.6  6.5  7.9   Total Bilirubin 0.3 - 1.2 mg/dL 0.5  0.7  0.9   Alkaline Phos 38 - 126 U/L 83  63  46   AST 15 - 41 U/L 18  21  19    ALT 0 - 44 U/L 13  14  14       RADIOGRAPHIC STUDIES: I have personally reviewed the radiological images as listed and agreed with the findings in the report. No results found.

## 2022-05-25 NOTE — Assessment & Plan Note (Signed)
Recommend B12 1000 mcg IM weekly x 4.

## 2022-06-01 ENCOUNTER — Ambulatory Visit (HOSPITAL_BASED_OUTPATIENT_CLINIC_OR_DEPARTMENT_OTHER): Payer: Medicaid Other | Admitting: Cardiology

## 2022-06-01 ENCOUNTER — Inpatient Hospital Stay: Payer: Medicaid Other | Attending: Oncology

## 2022-06-02 ENCOUNTER — Ambulatory Visit
Admission: EM | Admit: 2022-06-02 | Discharge: 2022-06-02 | Disposition: A | Discharge status: Home / Self Care | Payer: Medicaid Other | Attending: Family Medicine | Admitting: Family Medicine

## 2022-06-02 ENCOUNTER — Encounter: Payer: Self-pay | Admitting: Emergency Medicine

## 2022-06-02 DIAGNOSIS — U071 COVID-19: Secondary | ICD-10-CM | POA: Diagnosis present

## 2022-06-02 LAB — RESP PANEL BY RT-PCR (RSV, FLU A&B, COVID)  RVPGX2
Influenza A by PCR: NEGATIVE
Influenza B by PCR: NEGATIVE
Resp Syncytial Virus by PCR: NEGATIVE
SARS Coronavirus 2 by RT PCR: POSITIVE — AB

## 2022-06-02 MED ORDER — NIRMATRELVIR/RITONAVIR (PAXLOVID)TABLET: 3.0000 | ORAL_TABLET | Freq: Two times a day (BID) | ORAL | 0 refills | Status: AC

## 2022-06-02 NOTE — ED Provider Notes (Signed)
[facility]    CSN: [id number] Arrival date & time: 06/02/22  1130      History   Chief Complaint Chief Complaint  Patient presents with   Cough   Generalized Body Aches    HPI  37 year old female presents for evaluation of the above.  Patient is being followed for neutropenia.  She reports that she has been sick for the past 2 to 3 days.  She has had fever, Tmax 101.  She has had sneezing, sore throat, body aches, and cough as well.  Her 30-month-old started with illness 4 to 5 days ago.  She has been taking DayQuil with improvement.  Currently afebrile.  She states that she had a fever earlier this morning.    Past Medical History:  Diagnosis Date   Anxiety    Asthma    Blood transfusion without reported diagnosis    COVID-19 09/02/2018   COVID-19 long hauler manifesting chronic dyspnea    Depression    HPV (human papilloma virus) infection    Infertility management    Ovarian cyst    Seizures ([place])    Tachycardia    after covid, saw cardiologist    Patient Active Problem List   Diagnosis Date Noted   Vitamin B12 deficiency 05/25/2022   Other neutropenia ([employer]) 04/22/2022   Genital warts 04/14/2020   Chronic dyspnea 10/10/2019   Sinus tachycardia 09/29/2019   Women's annual routine gynecological examination 09/23/2019    Past Surgical History:  Procedure Laterality Date   NO PAST SURGERIES      OB History     Gravida  1   Para  1   Term  1   Preterm  0   AB  0   Living  1      SAB  0   IAB  0   Ectopic  0   Multiple  0   Live Births  1            Home Medications    Prior to Admission medications   Medication Sig Start Date End Date Taking? Authorizing Provider  nirmatrelvir/ritonavir (PAXLOVID) 20 x 150 MG & 10 x 100MG  TABS Take 3 tablets by mouth 2 (two) times daily for 5 days. Patient GFR is >60. Take nirmatrelvir (150 mg) two tablets twice daily for 5 days and ritonavir (100 mg) one tablet twice daily for 5 days.  06/02/22 06/07/22 Yes Elaine Middleton G, DO  acetaminophen (TYLENOL) 325 MG tablet Take 325 mg by mouth every 6 (six) hours as needed.    [provider]    Family History Family History  Problem Relation Age of Onset   Stomach cancer Paternal Grandmother    Breast cancer Maternal Grandmother    Healthy Father    Healthy Mother    Migraines Mother     Social History Social History   Tobacco Use   Smoking status: Never   Smokeless tobacco: Never  Vaping Use   Vaping Use: Never used  Substance Use Topics   Alcohol use: No   Drug use: No     Allergies   Ivp dye [iodinated contrast media]   Review of Systems Review of Systems Per HPI  Physical Exam Triage Vital Signs ED Triage Vitals  Enc Vitals Group     BP 06/02/22 1212 (!) 132/95     Pulse Rate 06/02/22 1212 100     Resp 06/02/22 1212 14     Temp 06/02/22 1212 98.2 F (  36.8 C)     Temp Source 06/02/22 1212 Oral     SpO2 06/02/22 1212 96 %     Weight 06/02/22 1209 160 lb (72.6 kg)     Height 06/02/22 1209 5\' 7"  (1.702 m)     Head Circumference --      Peak Flow --      Pain Score 06/02/22 1209 8     Pain Loc --      Pain Edu? --      Excl. in [place]? --    Updated Vital Signs BP (!) 132/95 (BP Location: Right Arm)   Pulse 100   Temp 98.2 F (36.8 C) (Oral)   Resp 14   Ht 5\' 7"  (1.702 m)   Wt 72.6 kg   SpO2 96%   Breastfeeding No   BMI 25.06 kg/m   Visual Acuity Right Eye Distance:   Left Eye Distance:   Bilateral Distance:    Right Eye Near:   Left Eye Near:    Bilateral Near:     Physical Exam Vitals and nursing note reviewed.  Constitutional:      Appearance: Normal appearance.  HENT:     Head: Normocephalic and atraumatic.     Right Ear: Tympanic membrane normal.     Left Ear: Tympanic membrane normal.     Mouth/Throat:     Pharynx: Posterior oropharyngeal erythema present.  Cardiovascular:     Rate and Rhythm: Normal rate and regular rhythm.  Pulmonary:     Effort: Pulmonary  effort is normal.     Breath sounds: Normal breath sounds. No wheezing, rhonchi or rales.  Neurological:     Mental Status: She is alert.      UC Treatments / Results  Labs (all labs ordered are listed, but only abnormal results are displayed) Labs Reviewed  RESP PANEL BY RT-PCR (RSV, FLU A&B, COVID)  [id number] - Abnormal; Notable for the following components:      Result Value   SARS Coronavirus 2 by RT PCR POSITIVE (*)    All other components within normal limits    EKG   Radiology No results found.  Procedures Procedures (including critical care time)  Medications Ordered in UC Medications - No data to display  Initial Impression / Assessment and Plan / UC Course  I have reviewed the triage vital signs and the nursing notes.  Pertinent labs & imaging results that were available during my care of the patient were reviewed by me and considered in my medical decision making (see chart for details).    37 year old female presents with COVID-19.  Acute illness with systemic symptoms.  Treating with Paxlovid given the fact that patient has underlying neutropenia.  Final Clinical Impressions(s) / UC Diagnoses   Final diagnoses:  COVID   Discharge Instructions   None    ED Prescriptions     Medication Sig Dispense Auth. Provider   nirmatrelvir/ritonavir (PAXLOVID) 20 x 150 MG & 10 x 100MG  TABS Take 3 tablets by mouth 2 (two) times daily for 5 days. Patient GFR is >60. Take nirmatrelvir (150 mg) two tablets twice daily for 5 days and ritonavir (100 mg) one tablet twice daily for 5 days. 30 tablet Coral Spikes, DO      PDMP not reviewed this encounter.   Coral Spikes, [place] 06/02/22 [id number]

## 2022-06-02 NOTE — ED Triage Notes (Signed)
Mother states that she has had cough, nasal congestion, chills, fever and bodayches that started 2 days ago.

## 2022-06-08 ENCOUNTER — Inpatient Hospital Stay: Payer: Medicaid Other

## 2022-06-15 ENCOUNTER — Inpatient Hospital Stay: Payer: Medicaid Other

## 2022-06-21 ENCOUNTER — Ambulatory Visit (INDEPENDENT_AMBULATORY_CARE_PROVIDER_SITE_OTHER): Payer: Medicaid Other

## 2022-06-21 ENCOUNTER — Ambulatory Visit
Admission: EM | Admit: 2022-06-21 | Discharge: 2022-06-21 | Disposition: A | Discharge status: Home / Self Care | Payer: Medicaid Other | Attending: Urgent Care | Admitting: Urgent Care

## 2022-06-21 DIAGNOSIS — M79675 Pain in left toe(s): Secondary | ICD-10-CM

## 2022-06-21 NOTE — ED Provider Notes (Signed)
[name]    CSN: [id number] Arrival date & time: 06/21/22  1505      History   Chief Complaint Chief Complaint  Patient presents with   Toe Injury    Entered by patient    HPI Jordan [name] is a 37 y.o. female.   HPI  Presents to urgent care with complaint of left fourth toe pain after stubbing her toe on a couch 2 weeks ago.  She endorses continued pain with movement.  Past Medical History:  Diagnosis Date   Anxiety    Asthma    Blood transfusion without reported diagnosis    COVID-19 09/02/2018   COVID-19 long hauler manifesting chronic dyspnea    Depression    HPV (human papilloma virus) infection    Infertility management    Ovarian cyst    Seizures ([place])    Tachycardia    after covid, saw cardiologist    Patient Active Problem List   Diagnosis Date Noted   Vitamin B12 deficiency 05/25/2022   Other neutropenia ([place]) 04/22/2022   Genital warts 04/14/2020   Chronic dyspnea 10/10/2019   Sinus tachycardia 09/29/2019   Women's annual routine gynecological examination 09/23/2019    Past Surgical History:  Procedure Laterality Date   NO PAST SURGERIES      OB History     Gravida  1   Para  1   Term  1   Preterm  0   AB  0   Living  1      SAB  0   IAB  0   Ectopic  0   Multiple  0   Live Births  1            Home Medications    Prior to Admission medications   Medication Sig Start Date End Date Taking? Authorizing Provider  acetaminophen (TYLENOL) 325 MG tablet Take 325 mg by mouth every 6 (six) hours as needed.    [provider]    Family History Family History  Problem Relation Age of Onset   Stomach cancer Paternal Grandmother    Breast cancer Maternal Grandmother    Healthy Father    Healthy Mother    Migraines Mother     Social History Social History   Tobacco Use   Smoking status: Never   Smokeless tobacco: Never  Vaping Use   Vaping Use: Never used  Substance Use Topics    Alcohol use: No   Drug use: No     Allergies   Ivp dye [iodinated contrast media]   Review of Systems Review of Systems   Physical Exam Triage Vital Signs ED Triage Vitals  Enc Vitals Group     BP      Pulse      Resp      Temp      Temp src      SpO2      Weight      Height      Head Circumference      Peak Flow      Pain Score      Pain Loc      Pain Edu?      Excl. in [place]?    No data found.  Updated Vital Signs There were no vitals taken for this visit.  Visual Acuity Right Eye Distance:   Left Eye Distance:   Bilateral Distance:    Right Eye Near:   Left Eye Near:  Bilateral Near:     Physical Exam Vitals reviewed.  Constitutional:      Appearance: Normal appearance.  Musculoskeletal:       Feet:  Skin:    General: Skin is warm and dry.  Neurological:     General: No focal deficit present.     Mental Status: She is alert and oriented to person, place, and time.  Psychiatric:        Mood and Affect: Mood normal.        Behavior: Behavior normal.      UC Treatments / Results  Labs (all labs ordered are listed, but only abnormal results are displayed) Labs Reviewed - No data to display  EKG   Radiology No results found.  Procedures Procedures (including critical care time)  Medications Ordered in UC Medications - No data to display  Initial Impression / Assessment and Plan / UC Course  I have reviewed the triage vital signs and the nursing notes.  Pertinent labs & imaging results that were available during my care of the patient were reviewed by me and considered in my medical decision making (see chart for details).   Possible fracture to L 4th toe, distal phalanx, vs soft tissue injury. Pt requests xray to confirm.  "Mild soft tissue swelling of distal 4th toe. No evidence of fracture or dislocation."  Recommend continued conservative treatment with NSAIDs for pain relief.  Ice, compression, elevation.  May splint to  adjacent toe if stability provides relief.   Final Clinical Impressions(s) / UC Diagnoses   Final diagnoses:  None   Discharge Instructions   None    ED Prescriptions   None    PDMP not reviewed this encounter.   Rose Phi, [place] 06/21/22 1705

## 2022-06-21 NOTE — ED Triage Notes (Signed)
Patient states she hit her left 4th toe on a couch while walking. The patient states there is pain to the area and pt is able to move toe.  Occurred: 2 weeks ago  Home interventions: tylenol

## 2022-06-21 NOTE — Discharge Instructions (Signed)
Recommend conservative management using ice, compression, elevation. May buddy tape to adjoining toe if stability provides relief. Use NSAID medications for pain relief.

## 2022-06-21 NOTE — Medical Student Note (Incomplete)
[facility] Systems analyst Note For educational purposes for Medical, PA and NP students only and not part of the legal medical record.   CSN: [id number] Arrival date & time: 06/21/22  1505      History   Chief Complaint Chief Complaint  Patient presents with   Toe Injury    Entered by patient    HPI Jordan [name] is a 37 y.o. female.  HPI  2 weeks ago hit toe on couch, L fourth toe. Can move independently but painful to move. Soaking with epsom salts and tylenol. Has been swollen. Painful with ROM in all directions, painful to palpation. Willing to do xray.   Past Medical History:  Diagnosis Date   Anxiety    Asthma    Blood transfusion without reported diagnosis    COVID-19 09/02/2018   COVID-19 long hauler manifesting chronic dyspnea    Depression    HPV (human papilloma virus) infection    Infertility management    Ovarian cyst    Seizures ([place])    Tachycardia    after covid, saw cardiologist    Patient Active Problem List   Diagnosis Date Noted   Vitamin B12 deficiency 05/25/2022   Other neutropenia ([place]) 04/22/2022   Genital warts 04/14/2020   Chronic dyspnea 10/10/2019   Sinus tachycardia 09/29/2019   Women's annual routine gynecological examination 09/23/2019    Past Surgical History:  Procedure Laterality Date   NO PAST SURGERIES      OB History     Gravida  1   Para  1   Term  1   Preterm  0   AB  0   Living  1      SAB  0   IAB  0   Ectopic  0   Multiple  0   Live Births  1            Home Medications    Prior to Admission medications   Medication Sig Start Date End Date Taking? Authorizing Provider  acetaminophen (TYLENOL) 325 MG tablet Take 325 mg by mouth every 6 (six) hours as needed.    [provider]    Family History Family History  Problem Relation Age of Onset   Stomach cancer Paternal Grandmother    Breast cancer Maternal Grandmother    Healthy Father    Healthy Mother     Migraines Mother     Social History Social History   Tobacco Use   Smoking status: Never   Smokeless tobacco: Never  Vaping Use   Vaping Use: Never used  Substance Use Topics   Alcohol use: No   Drug use: No     Allergies   Ivp dye [iodinated contrast media]   Review of Systems Review of Systems   Physical Exam Updated Vital Signs BP (!) 164/90 (BP Location: Right Arm)   Pulse (!) 111   Temp 99 F (37.2 C) (Oral)   Resp 16   LMP 06/04/2022   SpO2 97%   Breastfeeding No   Physical Exam   ED Treatments / Results  Labs (all labs ordered are listed, but only abnormal results are displayed) Labs Reviewed - No data to display  EKG  Radiology No results found.  Procedures Procedures (including critical care time)  Medications Ordered in ED Medications - No data to display   Initial Impression / Assessment and Plan / ED Course  I have reviewed the triage vital signs and the  nursing notes.  Pertinent labs & imaging results that were available during my care of the patient were reviewed by me and considered in my medical decision making (see chart for details).     ***  Final Clinical Impressions(s) / ED Diagnoses   Final diagnoses:  None    New Prescriptions New Prescriptions   No medications on file

## 2022-06-22 ENCOUNTER — Ambulatory Visit (HOSPITAL_BASED_OUTPATIENT_CLINIC_OR_DEPARTMENT_OTHER): Payer: Medicaid Other | Admitting: Cardiology

## 2022-06-22 ENCOUNTER — Inpatient Hospital Stay: Payer: Medicaid Other

## 2022-06-22 NOTE — Progress Notes (Incomplete)
Cardiology Office Note:    Date:  06/22/2022   ID:  Jordan [name], DOB 1985/08/09, MRN [id number]  PCP:  Kerri Perches, PA-C  Cardiologist:  None  Referring MD: Verlin Dike, MD   CC:  Dizziness   History of Present Illness:    Jordan [name] is a 37 y.o. female with a hx of tachycardia, seizures, long haul COVID, asthma, HPV, depression, and anxiety, who is seen as a new consult at the request of Cronk, Angelique Holm, MD for the evaluation and management of dizziness.  Referral notes from Dr. Foye Deer personally reviewed. At their appointment 05/09/2022 she complained of ongoing dizziness occurring anytime, with walking, sitting, or lying down. It was thought stress may be a contributing factor. She was referred to cardiology for further evaluation and possible Holter monitor.  She presented to urgent care 06/02/2022 and tested positive for COVID. She was treated with Paxlovid.  Cardiovascular risk factors: Prior clinical ASCVD:  Comorbid conditions:  Metabolic syndrome/Obesity: Chronic inflammatory conditions: Tobacco use history: Family history: Prior cardiac testing and/or incidental findings on other testing (ie coronary calcium): Exercise level: Current diet:  She denies any palpitations, chest pain, shortness of breath, or peripheral edema. No lightheadedness, headaches, syncope, orthopnea, or PND.  Past Medical History:  Diagnosis Date   Anxiety    Asthma    Blood transfusion without reported diagnosis    COVID-19 09/02/2018   COVID-19 long hauler manifesting chronic dyspnea    Depression    HPV (human papilloma virus) infection    Infertility management    Ovarian cyst    Seizures (HCC)    Tachycardia    after covid, saw cardiologist    Past Surgical History:  Procedure Laterality Date   NO PAST SURGERIES      Current Medications: Current Outpatient Medications on File Prior to Visit  Medication Sig   acetaminophen (TYLENOL) 325 MG tablet Take 325  mg by mouth every 6 (six) hours as needed.   No current facility-administered medications on file prior to visit.     Allergies:   Ivp dye [iodinated contrast media]   Social History   Tobacco Use   Smoking status: Never   Smokeless tobacco: Never  Vaping Use   Vaping Use: Never used  Substance Use Topics   Alcohol use: No   Drug use: No    Family History: family history includes Breast cancer in her maternal grandmother; Healthy in her father and mother; Migraines in her mother; Stomach cancer in her paternal grandmother.  ROS:   Please see the history of present illness.  Additional pertinent ROS: Constitutional: Negative for chills, fever, night sweats, unintentional weight loss  HENT: Negative for ear pain and hearing loss.   Eyes: Negative for loss of vision and eye pain.  Respiratory: Negative for cough, sputum, wheezing.   Cardiovascular: See HPI. Gastrointestinal: Negative for abdominal pain, melena, and hematochezia.  Genitourinary: Negative for dysuria and hematuria.  Musculoskeletal: Negative for falls and myalgias.  Skin: Negative for itching and rash.  Neurological: Negative for focal weakness, focal sensory changes and loss of consciousness.  Endo/Heme/Allergies: Does not bruise/bleed easily.     EKGs/Labs/Other Studies Reviewed:    The following studies were reviewed today:  Right LE Venous Doppler  08/27/2021: Summary:  RIGHT:  - There is no evidence of deep vein thrombosis in the lower extremity.    LEFT:  - No evidence of common femoral vein obstruction.   Echo  08/27/2021: Sonographer Comments: Technically  difficult study due to poor echo  windows, suboptimal apical window and suboptimal subcostal window. 30  weeks 2 days pregnant.   IMPRESSIONS   1. Left ventricular ejection fraction, by estimation, is 70 to 75%. The  left ventricle has hyperdynamic function. The left ventricle has no  regional wall motion abnormalities. Left ventricular  diastolic parameters  are indeterminate.   2. RV not well visualized. Grossly appears normal in size and function. .  Right ventricular systolic function was not well visualized. The right  ventricular size is not well visualized. Tricuspid regurgitation signal is  inadequate for assessing PA  pressure.   3. The mitral valve was not well visualized. No evidence of mitral valve  regurgitation.   4. The aortic valve is tricuspid. Aortic valve regurgitation is not  visualized. No aortic stenosis is present.   5. IVC is small suggesting low RA pressure and hypovolemia.    EKG:  EKG is personally reviewed.   06/22/2022:  ***  Recent Labs: 08/27/2021: Magnesium 1.7 09/27/2021: ALT 13; BUN 5; Creatinine, Ser 0.86; Potassium 3.9; Sodium 138 04/20/2022: Hemoglobin 11.5; Platelets 276   Recent Lipid Panel    Component Value Date/Time   CHOL 190 06/13/2018 1456   TRIG 42 09/28/2019 2222   HDL 59 06/13/2018 1456   CHOLHDL 3.2 06/13/2018 1456   LDLCALC 112 (H) 06/13/2018 1456    Physical Exam:    VS:  LMP 06/04/2022     Wt Readings from Last 3 Encounters:  06/02/22 160 lb (72.6 kg)  05/25/22 163 lb 1.6 oz (74 kg)  04/20/22 166 lb 1.6 oz (75.3 kg)    GEN: Well nourished, well developed in no acute distress HEENT: Normal, moist mucous membranes NECK: No JVD CARDIAC: regular rhythm, normal S1 and S2, no rubs or gallops. No murmur. VASCULAR: Radial and DP pulses 2+ bilaterally. No carotid bruits RESPIRATORY:  Clear to auscultation without rales, wheezing or rhonchi  ABDOMEN: Soft, non-tender, non-distended MUSCULOSKELETAL:  Ambulates independently SKIN: Warm and dry, no edema NEUROLOGIC:  Alert and oriented x 3. No focal neuro deficits noted. PSYCHIATRIC:  Normal affect    ASSESSMENT:    No diagnosis found. PLAN:     Cardiac risk counseling and prevention recommendations: -recommend heart healthy/Mediterranean diet, with whole grains, fruits, vegetable, fish, lean meats, nuts,  and olive oil. Limit salt. -recommend moderate walking, 3-5 times/week for 30-50 minutes each session. Aim for at least 150 minutes.week. Goal should be pace of 3 miles/hours, or walking 1.5 miles in 30 minutes -recommend avoidance of tobacco products. Avoid excess alcohol. -ASCVD risk score: The ASCVD Risk score (Arnett DK, et al., 2019) failed to calculate for the following reasons:   The 2019 ASCVD risk score is only valid for ages 44 to 10    Plan for follow up: *** or sooner as needed.  Buford Dresser, MD, PhD, [facility]    Medication Adjustments/Labs and Tests Ordered: Current medicines are reviewed at length with the patient today.  Concerns regarding medicines are outlined above.   No orders of the defined types were placed in this encounter.  No orders of the defined types were placed in this encounter.  There are no Patient Instructions on file for this visit.   I,Mathew Stumpf,acting as a Education administrator for [employer], MD.,have documented all relevant documentation on the behalf of Buford Dresser, MD,as directed by  Buford Dresser, MD while in the presence of Buford Dresser, MD.  ***  Signed, Buford Dresser,  MD PhD 06/22/2022 12:00 PM    [place]

## 2022-06-26 ENCOUNTER — Other Ambulatory Visit: Payer: Self-pay | Admitting: Family Medicine

## 2022-06-26 DIAGNOSIS — N83292 Other ovarian cyst, left side: Secondary | ICD-10-CM

## 2022-06-29 ENCOUNTER — Inpatient Hospital Stay: Payer: Medicaid Other

## 2022-07-06 ENCOUNTER — Inpatient Hospital Stay: Payer: Medicaid Other

## 2022-07-06 ENCOUNTER — Ambulatory Visit: Payer: Medicaid Other

## 2022-07-06 ENCOUNTER — Inpatient Hospital Stay: Payer: Medicaid Other | Attending: Oncology

## 2022-07-12 ENCOUNTER — Ambulatory Visit: Payer: Medicaid Other

## 2022-07-13 ENCOUNTER — Inpatient Hospital Stay: Payer: Medicaid Other | Admitting: Oncology

## 2022-07-13 ENCOUNTER — Inpatient Hospital Stay: Payer: Medicaid Other

## 2022-07-13 NOTE — Assessment & Plan Note (Deleted)
Labs are reviewed and discussed with patient. normal LDH, low normal end Vitamin B12, negative hepatitis, negative HIV, negative ANA, negative flowcytometry and no M protein on SPEP, mildly elevated light chain ration, non specific.    Recommend patient to get a B12 injections to see if improvement of B12 may help with the neutropenia level.  She agrees with the plan.

## 2022-08-01 ENCOUNTER — Encounter: Payer: Self-pay | Admitting: *Deleted

## 2022-08-01 ENCOUNTER — Encounter: Payer: Self-pay | Admitting: Oncology

## 2022-08-01 ENCOUNTER — Emergency Department
Admission: EM | Admit: 2022-08-01 | Discharge: 2022-08-01 | Disposition: A | Discharge status: Home / Self Care | Payer: Medicaid Other | Attending: Emergency Medicine | Admitting: Emergency Medicine

## 2022-08-01 ENCOUNTER — Other Ambulatory Visit: Payer: Self-pay

## 2022-08-01 DIAGNOSIS — S161XXA Strain of muscle, fascia and tendon at neck level, initial encounter: Secondary | ICD-10-CM | POA: Insufficient documentation

## 2022-08-01 DIAGNOSIS — Y92410 Unspecified street and highway as the place of occurrence of the external cause: Secondary | ICD-10-CM | POA: Diagnosis not present

## 2022-08-01 DIAGNOSIS — M542 Cervicalgia: Secondary | ICD-10-CM | POA: Diagnosis present

## 2022-08-01 MED ORDER — CYCLOBENZAPRINE HCL 5 MG PO TABS: 5.0000 mg | ORAL_TABLET | Freq: Three times a day (TID) | ORAL | 0 refills | Status: DC | PRN
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
## 2022-08-01 NOTE — Discharge Instructions (Signed)
Please alternate Tylenol and ibuprofen as needed for pain.  You may use Flexeril as needed for muscle spasm/tightness.  Return to the ER for any worsening symptoms or any urgent changes in your health.

## 2022-09-12 ENCOUNTER — Inpatient Hospital Stay: Payer: Medicaid Other | Admitting: Oncology

## 2022-09-12 ENCOUNTER — Inpatient Hospital Stay: Payer: Medicaid Other | Attending: Oncology

## 2022-09-12 NOTE — Assessment & Plan Note (Deleted)
Labs are reviewed and discussed with patient. normal LDH, low normal end Vitamin B12, negative hepatitis, negative HIV, negative ANA, negative flowcytometry and no M protein on SPEP, mildly elevated light chain ration, non specific.    Recommend patient to get a B12 injections to see if improvement of B12 may help with the neutropenia level.  She agrees with the plan. 

## 2022-10-23 ENCOUNTER — Ambulatory Visit: Payer: Self-pay

## 2022-10-23 ENCOUNTER — Ambulatory Visit
Admission: EM | Admit: 2022-10-23 | Discharge: 2022-10-23 | Disposition: A | Discharge status: Home / Self Care | Payer: Medicaid Other | Attending: Emergency Medicine | Admitting: Emergency Medicine

## 2022-10-23 DIAGNOSIS — Z1152 Encounter for screening for COVID-19: Secondary | ICD-10-CM | POA: Insufficient documentation

## 2022-10-23 DIAGNOSIS — J029 Acute pharyngitis, unspecified: Secondary | ICD-10-CM | POA: Diagnosis present

## 2022-10-23 LAB — POCT RAPID STREP A (OFFICE): Rapid Strep A Screen: NEGATIVE

## 2022-10-23 NOTE — ED Provider Notes (Signed)
[facility]    CSN: [id number] Arrival date & time: 10/23/22  1554      History   Chief Complaint Chief Complaint  Patient presents with   Sore Throat   Chills    HPI Jordan [name] is a 37 y.o. female.  Patient presents with sore throat and chills since last night.  She has a sore on her tongue also.  No fever, rash, cough, shortness of breath, or other symptoms.  She took DayQuil this morning.  Her medical history includes asthma, seizures, anxiety, depression.  The history is provided by the patient and medical records.    Past Medical History:  Diagnosis Date   Anxiety    Asthma    Blood transfusion without reported diagnosis    COVID-19 09/02/2018   COVID-19 long hauler manifesting chronic dyspnea    Depression    HPV (human papilloma virus) infection    Infertility management    Ovarian cyst    Seizures (HCC)    Tachycardia    after covid, saw cardiologist    Patient Active Problem List   Diagnosis Date Noted   Vitamin B12 deficiency 05/25/2022   Other neutropenia (HCC) 04/22/2022   Genital warts 04/14/2020   Chronic dyspnea 10/10/2019   Sinus tachycardia 09/29/2019   Women's annual routine gynecological examination 09/23/2019    Past Surgical History:  Procedure Laterality Date   NO PAST SURGERIES      OB History     Gravida  1   Para  1   Term  1   Preterm  0   AB  0   Living  1      SAB  0   IAB  0   Ectopic  0   Multiple  0   Live Births  1            Home Medications    Prior to Admission medications   Medication Sig Start Date End Date Taking? Authorizing Provider  acetaminophen (TYLENOL) 325 MG tablet Take 325 mg by mouth every 6 (six) hours as needed.    [provider]  cyclobenzaprine (FLEXERIL) 5 MG tablet Take 1-2 tablets (5-10 mg total) by mouth 3 (three) times daily as needed for muscle spasms. Patient not taking: Reported on 10/23/2022 08/01/22   Evon Slack, PA-C    Family  History Family History  Problem Relation Age of Onset   Stomach cancer Paternal Grandmother    Breast cancer Maternal Grandmother    Healthy Father    Healthy Mother    Migraines Mother     Social History Social History   Tobacco Use   Smoking status: Never   Smokeless tobacco: Never  Vaping Use   Vaping Use: Never used  Substance Use Topics   Alcohol use: No   Drug use: No     Allergies   Ivp dye [iodinated contrast media]   Review of Systems Review of Systems  Constitutional:  Positive for chills. Negative for fever.  HENT:  Positive for mouth sores and sore throat. Negative for ear pain, trouble swallowing and voice change.   Respiratory:  Negative for cough and shortness of breath.   Cardiovascular:  Negative for chest pain and palpitations.  Gastrointestinal:  Negative for diarrhea and vomiting.  Skin:  Negative for color change and rash.     Physical Exam Triage Vital Signs ED Triage Vitals [10/23/22 1601]  Enc Vitals Group     BP  Pulse Rate 100     Resp 18     Temp 98.5 F (36.9 C)     Temp src      SpO2 97 %     Weight      Height      Head Circumference      Peak Flow      Pain Score      Pain Loc      Pain Edu?      Excl. in GC?    No data found.  Updated Vital Signs BP 129/88   Pulse 100   Temp 98.5 F (36.9 C)   Resp 18   LMP 10/04/2022   SpO2 97%   Visual Acuity Right Eye Distance:   Left Eye Distance:   Bilateral Distance:    Right Eye Near:   Left Eye Near:    Bilateral Near:     Physical Exam Vitals and nursing note reviewed.  Constitutional:      General: She is not in acute distress.    Appearance: She is well-developed.  HENT:     Right Ear: Tympanic membrane normal.     Left Ear: Tympanic membrane normal.     Nose: Nose normal.     Mouth/Throat:     Mouth: Mucous membranes are moist.     Pharynx: Posterior oropharyngeal erythema present.     Comments: Small tongue ulcer. Cardiovascular:     Rate and  Rhythm: Normal rate and regular rhythm.     Heart sounds: Normal heart sounds.  Pulmonary:     Effort: Pulmonary effort is normal. No respiratory distress.     Breath sounds: Normal breath sounds.  Musculoskeletal:     Cervical back: Neck supple.  Skin:    General: Skin is warm and dry.  Neurological:     Mental Status: She is alert.  Psychiatric:        Mood and Affect: Mood normal.        Behavior: Behavior normal.      UC Treatments / Results  Labs (all labs ordered are listed, but only abnormal results are displayed) Labs Reviewed  SARS CORONAVIRUS 2 (TAT 6-24 HRS)  POCT RAPID STREP A (OFFICE)    EKG   Radiology No results found.  Procedures Procedures (including critical care time)  Medications Ordered in UC Medications - No data to display  Initial Impression / Assessment and Plan / UC Course  I have reviewed the triage vital signs and the nursing notes.  Pertinent labs & imaging results that were available during my care of the patient were reviewed by me and considered in my medical decision making (see chart for details).    Viral pharyngitis.  Rapid strep negative.  COVID pending per patient request.  Discussed symptomatic treatment including Tylenol or ibuprofen.  Instructed patient to follow up with her PCP if her symptoms are not improving.  Education provided on pharyngitis.  She agrees to plan of care.   Final Clinical Impressions(s) / UC Diagnoses   Final diagnoses:  Viral pharyngitis     Discharge Instructions      The strep test is negative.  Follow up with your primary care provider if your symptoms are not improving.        ED Prescriptions   None    PDMP not reviewed this encounter.   Mickie Bail, NP 10/23/22 1623

## 2022-10-23 NOTE — ED Triage Notes (Addendum)
Patient to Urgent Care with complaints of sore throat/ chills.  Symptoms started last night, worsened today. Painful to swallow. Also has sore present on her tongue.  Taking Nyquil. Gargling salt water.

## 2022-10-23 NOTE — Discharge Instructions (Addendum)
The strep test is negative.  Follow up with your primary care provider if your symptoms are not improving.    

## 2022-10-24 LAB — SARS CORONAVIRUS 2 (TAT 6-24 HRS): SARS Coronavirus 2: NEGATIVE

## 2022-12-23 ENCOUNTER — Encounter (HOSPITAL_COMMUNITY): Payer: Self-pay | Admitting: Emergency Medicine

## 2022-12-23 ENCOUNTER — Other Ambulatory Visit: Payer: Self-pay

## 2022-12-23 ENCOUNTER — Emergency Department (HOSPITAL_COMMUNITY)
Admission: EM | Admit: 2022-12-23 | Discharge: 2022-12-23 | Disposition: A | Discharge status: Home / Self Care | Payer: Medicaid Other | Attending: Emergency Medicine | Admitting: Emergency Medicine

## 2022-12-23 DIAGNOSIS — J45909 Unspecified asthma, uncomplicated: Secondary | ICD-10-CM | POA: Insufficient documentation

## 2022-12-23 DIAGNOSIS — M79674 Pain in right toe(s): Secondary | ICD-10-CM | POA: Diagnosis not present

## 2022-12-23 DIAGNOSIS — M79641 Pain in right hand: Secondary | ICD-10-CM | POA: Diagnosis not present

## 2022-12-23 DIAGNOSIS — Y92410 Unspecified street and highway as the place of occurrence of the external cause: Secondary | ICD-10-CM | POA: Diagnosis not present

## 2022-12-23 DIAGNOSIS — M25531 Pain in right wrist: Secondary | ICD-10-CM | POA: Insufficient documentation

## 2022-12-23 DIAGNOSIS — M542 Cervicalgia: Secondary | ICD-10-CM | POA: Diagnosis not present

## 2022-12-23 DIAGNOSIS — M25511 Pain in right shoulder: Secondary | ICD-10-CM | POA: Insufficient documentation
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
## 2022-12-23 NOTE — Discharge Instructions (Signed)
Alternate Motrin and Tylenol as needed for pain.  You can apply ice to sore areas on your arm.  Try moist heat over sore neck and back.  Follow-up with your primary care physician in the next 2 to 3 days.  Monitor for any signs worsen including increased chest pain, shortness of breath, lightheadedness, or changes in mental status.  Return to the ED for any worsening symptoms or further concerns.
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
## 2023-03-05 ENCOUNTER — Other Ambulatory Visit: Payer: Self-pay

## 2023-03-05 ENCOUNTER — Ambulatory Visit: Payer: Medicaid Other | Attending: Family Medicine

## 2023-03-05 DIAGNOSIS — M5412 Radiculopathy, cervical region: Secondary | ICD-10-CM | POA: Diagnosis present

## 2023-03-05 DIAGNOSIS — M6281 Muscle weakness (generalized): Secondary | ICD-10-CM | POA: Insufficient documentation

## 2023-03-05 DIAGNOSIS — M546 Pain in thoracic spine: Secondary | ICD-10-CM | POA: Insufficient documentation
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
## 2023-03-11 ENCOUNTER — Ambulatory Visit: Payer: Medicaid Other | Admitting: Neurology

## 2023-03-23 ENCOUNTER — Ambulatory Visit: Payer: Medicaid Other

## 2023-03-25 ENCOUNTER — Ambulatory Visit: Payer: Medicaid Other | Admitting: Neurology

## 2023-04-02 ENCOUNTER — Encounter: Payer: Self-pay | Admitting: Neurology

## 2023-04-02 ENCOUNTER — Ambulatory Visit: Payer: Medicaid Other | Admitting: Neurology

## 2023-04-02 VITALS — BP 129/81 | HR 101 | Ht 66.0 in | Wt 163.0 lb

## 2023-04-02 DIAGNOSIS — M5481 Occipital neuralgia: Secondary | ICD-10-CM | POA: Diagnosis not present

## 2023-04-02 DIAGNOSIS — G8929 Other chronic pain: Secondary | ICD-10-CM | POA: Insufficient documentation

## 2023-04-02 DIAGNOSIS — S134XXA Sprain of ligaments of cervical spine, initial encounter: Secondary | ICD-10-CM

## 2023-04-02 DIAGNOSIS — M5412 Radiculopathy, cervical region: Secondary | ICD-10-CM | POA: Insufficient documentation

## 2023-04-02 DIAGNOSIS — R519 Headache, unspecified: Secondary | ICD-10-CM | POA: Diagnosis not present
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
## 2023-04-06 ENCOUNTER — Telehealth: Payer: Self-pay

## 2023-04-06 ENCOUNTER — Ambulatory Visit: Payer: Medicaid Other

## 2023-04-06 NOTE — Telephone Encounter (Signed)
Spoke with patient regarding missed appointment. She stated that she has transportation barriers and cannot make it out to [place] easily. She inquired about [place] location, provided her with phone number to [place] clinic. Cancelling future appts at [facility] location.  Berta Minor, PTA 04/06/23 11:42 AM

## 2023-04-08 ENCOUNTER — Telehealth: Payer: Self-pay

## 2023-04-08 NOTE — Telephone Encounter (Signed)
Received paperwork from [facility] for [facility]. Per Dr Marko Stai notes, PCP has patient out on disability until 04/23/2023. Also per the notes, Dr Vickey Huger advised that she will need to remain on disability through her PCP until her MRI and nerve conduction results are back and we have her scheduled for a follow up in February.   [facility] EMG is scheduled for January 10 and the MRI is scheduled for January 18.  Called and spoke with patient and advised her of same. She is going to speak with her PCP and advised that we will hold on to paperwork until her f/u with our office.

## 2023-05-10 ENCOUNTER — Encounter: Payer: Medicaid Other | Admitting: Neurology

## 2023-05-13 ENCOUNTER — Ambulatory Visit: Payer: Medicaid Other | Admitting: Neurology

## 2023-05-18 ENCOUNTER — Other Ambulatory Visit: Payer: Medicaid Other

## 2023-05-27 ENCOUNTER — Emergency Department
Admission: EM | Admit: 2023-05-27 | Discharge: 2023-05-27 | Disposition: A | Discharge status: Home / Self Care | Payer: Medicaid Other | Attending: Emergency Medicine | Admitting: Emergency Medicine

## 2023-05-27 ENCOUNTER — Other Ambulatory Visit: Payer: Self-pay

## 2023-05-27 DIAGNOSIS — J45909 Unspecified asthma, uncomplicated: Secondary | ICD-10-CM | POA: Diagnosis not present

## 2023-05-27 DIAGNOSIS — Z20822 Contact with and (suspected) exposure to covid-19: Secondary | ICD-10-CM | POA: Diagnosis not present

## 2023-05-27 DIAGNOSIS — G43909 Migraine, unspecified, not intractable, without status migrainosus: Secondary | ICD-10-CM | POA: Diagnosis not present

## 2023-05-27 DIAGNOSIS — G43809 Other migraine, not intractable, without status migrainosus: Secondary | ICD-10-CM

## 2023-05-27 DIAGNOSIS — R519 Headache, unspecified: Secondary | ICD-10-CM | POA: Diagnosis present

## 2023-05-27 LAB — BASIC METABOLIC PANEL
Anion gap: 10 (ref 5–15)
BUN: 8 mg/dL (ref 6–20)
CO2: 22 mmol/L (ref 22–32)
Calcium: 9 mg/dL (ref 8.9–10.3)
Chloride: 105 mmol/L (ref 98–111)
Creatinine, Ser: 0.73 mg/dL (ref 0.44–1.00)
GFR, Estimated: >60 mL/min (ref >60)
Glucose, Bld: 90 mg/dL (ref 70–99)
Potassium: 3.7 mmol/L (ref 3.5–5.1)
Sodium: 137 mmol/L (ref 135–145)

## 2023-05-27 LAB — CBC
HCT: 35.8 % — ABNORMAL LOW (ref 36.0–46.0)
Hemoglobin: 11.9 g/dL — ABNORMAL LOW (ref 12.0–15.0)
MCH: 28.9 pg (ref 26.0–34.0)
MCHC: 33.2 g/dL (ref 30.0–36.0)
MCV: 86.9 fL (ref 80.0–100.0)
Platelets: 226 10*3/uL (ref 150–400)
RBC: 4.12 MIL/uL (ref 3.87–5.11)
RDW: 13.2 % (ref 11.5–15.5)
WBC: 3.6 10*3/uL — ABNORMAL LOW (ref 4.0–10.5)
nRBC: 0 % (ref 0.0–0.2)

## 2023-05-27 LAB — RESP PANEL BY RT-PCR (RSV, FLU A&B, COVID)  RVPGX2
Influenza A by PCR: NEGATIVE
Influenza B by PCR: NEGATIVE
Resp Syncytial Virus by PCR: NEGATIVE
SARS Coronavirus 2 by RT PCR: NEGATIVE

## 2023-05-27 MED ORDER — BUTALBITAL-APAP-CAFFEINE 50-325-40 MG PO TABS: 1.0000 | ORAL_TABLET | Freq: Four times a day (QID) | ORAL | 0 refills | Status: DC | PRN

## 2023-05-27 MED ORDER — DIPHENHYDRAMINE HCL 50 MG/ML IJ SOLN
50.0000 mg | Freq: Once | INTRAMUSCULAR | Status: AC
  Administered 2023-05-27: 50 mg via INTRAVENOUS
  Filled 2023-05-27: qty 1

## 2023-05-27 MED ORDER — KETOROLAC TROMETHAMINE 30 MG/ML IJ SOLN
30.0000 mg | Freq: Once | INTRAMUSCULAR | Status: AC
  Administered 2023-05-27: 30 mg via INTRAVENOUS
  Filled 2023-05-27: qty 1

## 2023-05-27 MED ORDER — METOCLOPRAMIDE HCL 5 MG/ML IJ SOLN
10.0000 mg | Freq: Once | INTRAMUSCULAR | Status: AC
  Administered 2023-05-27: 10 mg via INTRAVENOUS
  Filled 2023-05-27: qty 2

## 2023-05-27 MED ORDER — SODIUM CHLORIDE 0.9 % IV BOLUS
1000.0000 mL | Freq: Once | INTRAVENOUS | Status: AC
  Administered 2023-05-27: 1000 mL via INTRAVENOUS

## 2023-05-27 NOTE — ED Notes (Signed)
See triage notes. Patient c/o headache for the past three days.

## 2023-05-27 NOTE — ED Provider Notes (Signed)
[facility] Provider Note    Event Date/Time   First MD Initiated Contact with Patient 05/27/23 1507     (approximate)  History   Chief Complaint: Headache  HPI  Jordan [name] is a 38 y.o. female with a past medical history of anxiety, asthma, seizures, migraine headaches, presents to the emergency department for worsening headache x 3 days.  According to the patient for the past 3 days she has had a worsening headache mostly left-sided feels like it is behind her left eye.  Patient states she normally takes medications for migraines but she does not know which medications and states she has been out of these medications for quite some time but she called her PCP and they do not have any appointments to see her.  Patient denies any weakness or numbness of any arm or leg.  No fever.  Does state some slight congestion.  Physical Exam   Triage Vital Signs: ED Triage Vitals  Encounter Vitals Group     BP 05/27/23 1034 (!) 152/99     Systolic BP Percentile --      Diastolic BP Percentile --      Pulse Rate 05/27/23 1034 100     Resp 05/27/23 1034 20     Temp 05/27/23 1034 98.9 F (37.2 C)     Temp src --      SpO2 05/27/23 1034 99 %     Weight 05/27/23 1033 160 lb (72.6 kg)     Height 05/27/23 1033 5\' 7"  (1.702 m)     Head Circumference --      Peak Flow --      Pain Score 05/27/23 1033 10     Pain Loc --      Pain Education --      Exclude from Growth Chart --     Most recent vital signs: Vitals:   05/27/23 1034  BP: (!) 152/99  Pulse: 100  Resp: 20  Temp: 98.9 F (37.2 C)  SpO2: 99%    General: Awake, no distress.  CV:  Good peripheral perfusion.  Regular rate and rhythm  Resp:  Normal effort.  Equal breath sounds bilaterally. Other:  Equal grip strength bilaterally, no pronator drift.  Photophobia but equal and reactive pupils no obvious cranial nerve deficits.  ED Results / Procedures / Treatments   MEDICATIONS ORDERED IN  ED: Medications  ketorolac (TORADOL) 30 MG/ML injection 30 mg (has no administration in time range)  metoCLOPramide (REGLAN) injection 10 mg (has no administration in time range)  diphenhydrAMINE (BENADRYL) injection 50 mg (has no administration in time range)  sodium chloride 0.9 % bolus 1,000 mL (has no administration in time range)     IMPRESSION / MDM / ASSESSMENT AND PLAN / ED COURSE  I reviewed the triage vital signs and the nursing notes.  Patient's presentation is most consistent with acute presentation with potential threat to life or bodily function.  Patient presents to the emergency department for worsening headache x 3 days.  Overall the patient appears well, no distress.  Describes it more as a left sided headache/left eye headache.  Symptoms seem suggestive of an ocular migraine.  Patient states a history of migraines in the past.  Will treat with Toradol Reglan Benadryl IV fluids.  Given the patient's mild congestion and headache we will obtain a COVID/flu swab as a precaution.  Patient agreeable to plan of care and workup.  Patient is feeling much better.  CBC  chemistry and respiratory panel are negative.  Will discharge home with a Fioricet prescription discussed continued supportive care at home plenty of rest fluids.  Patient agreeable to plan.  FINAL CLINICAL IMPRESSION(S) / ED DIAGNOSES   Migraine headache   Note:  This document was prepared using Dragon voice recognition software and may include unintentional dictation errors.   Minna Antis, MD 05/27/23 1723

## 2023-05-27 NOTE — ED Triage Notes (Signed)
Pt to ED for headache x3 days, reports pain to area of left eye. States ran out of meds that she takes for headaches and PCP didn't have any appts.

## 2023-06-17 ENCOUNTER — Telehealth: Payer: Self-pay | Admitting: Neurology

## 2023-06-17 NOTE — Telephone Encounter (Signed)
 ..  Pt understands that although there may be some limitations with this type of visit, we will take all precautions to reduce any security or privacy concerns.  Pt understands that this will be treated like an in office visit and we will file with pt's insurance, and there may be a patient responsible charge related to this service. ? ?

## 2023-06-18 ENCOUNTER — Ambulatory Visit: Payer: Medicaid Other

## 2023-06-18 ENCOUNTER — Telehealth: Payer: Medicaid Other | Admitting: Neurology

## 2023-06-18 ENCOUNTER — Telehealth: Payer: Self-pay

## 2023-06-18 ENCOUNTER — Encounter: Payer: Self-pay | Admitting: Neurology

## 2023-06-18 DIAGNOSIS — G8929 Other chronic pain: Secondary | ICD-10-CM | POA: Diagnosis not present

## 2023-06-18 DIAGNOSIS — M5481 Occipital neuralgia: Secondary | ICD-10-CM

## 2023-06-18 DIAGNOSIS — R519 Headache, unspecified: Secondary | ICD-10-CM

## 2023-06-18 DIAGNOSIS — G43801 Other migraine, not intractable, with status migrainosus: Secondary | ICD-10-CM | POA: Diagnosis not present

## 2023-06-18 MED ORDER — BUTALBITAL-APAP-CAFFEINE 50-325-40 MG PO TABS: 1.0000 | ORAL_TABLET | Freq: Four times a day (QID) | ORAL | 0 refills | Status: DC | PRN
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
## 2023-06-18 NOTE — Patient Instructions (Signed)
 Migraine Headache A migraine headache is a very strong throbbing pain on one or both sides of your head. This type of headache can also cause other symptoms. It can last from 4 hours to 3 days. Talk with your doctor about what things may bring on (trigger) this condition. What are the causes? The exact cause of a migraine is not known. This condition may be brought on or caused by: Smoking. Medicines, such as: Medicine used to treat chest pain (nitroglycerin). Birth control pills. Estrogen. Some blood pressure medicines. Certain substances in some foods or drinks. Foods and drinks, such as: Cheese. Chocolate. Alcohol. Caffeine. Doing physical activity that is very hard. Other things that may trigger a migraine headache include: Periods. Pregnancy. Hunger. Stress. Getting too much or too little sleep. Weather changes. Feeling tired (fatigue). What increases the risk? Being 18-65 years old. Being female. Having a family history of migraine headaches. Being Caucasian. Having a mental health condition, such as being sad (depressed) or feeling worried or nervous (anxious). Being very overweight (obese). What are the signs or symptoms? A throbbing pain. This pain may: Happen in any area of the head, such as on one or both sides. Make it hard to do daily activities. Get worse with physical activity. Get worse around bright lights, loud noises, or smells. Other symptoms may include: Feeling like you may vomit (nauseous). Vomiting. Dizziness. Before a migraine headache starts, you may get warning signs (an aura). An aura may include: Seeing flashing lights or having blind spots. Seeing bright spots, halos, or zigzag lines. Having tunnel vision or blurred vision. Having numbness or a tingling feeling. Having trouble talking. Having weak muscles. After a migraine ends, you may have symptoms. These may include: Tiredness. Trouble thinking (concentrating). How is this  treated? Taking medicines that: Relieve pain. Relieve the feeling like you may vomit. Prevent migraine headaches. Treatment may also include: Acupuncture. Lifestyle changes like avoiding foods that bring on migraine headaches. Learning ways to control your body functions (biofeedback). Therapy to help you know and deal with negative thoughts (cognitive behavioral therapy). Follow these instructions at home: Medicines Take over-the-counter and prescription medicines only as told by your doctor. If told, take steps to prevent problems with pooping (constipation). You may need to: Drink enough fluid to keep your pee (urine) pale yellow. Take medicines. You will be told what medicines to take. Eat foods that are high in fiber. These include beans, whole grains, and fresh fruits and vegetables. Limit foods that are high in fat and sugar. These include fried or sweet foods. Ask your doctor if you should avoid driving or using machines while you are taking your medicine. Lifestyle  Do not drink alcohol. Do not smoke or use any products that contain nicotine or tobacco. If you need help quitting, ask your doctor. Get 7-9 hours of sleep each night, or the amount recommended by your doctor. Find ways to deal with stress, such as meditation, deep breathing, or yoga. Try to exercise often. This can help lessen how bad and how often your migraines happen. General instructions Keep a journal to find out what may bring on your migraine headaches. This can help you avoid those things. For example, write down: What you eat and drink. How much sleep you get. Any change to your medicines or diet. If you have a migraine headache: Avoid things that make your symptoms worse, such as bright lights. Lie down in a dark, quiet room. Do not drive or use machinery. Ask your  doctor what activities are safe for you. Where to find more information Coalition for Headache and Migraine Patients (CHAMP):  headachemigraine.org American Migraine Foundation: americanmigrainefoundation.org National Headache Foundation: headaches.org Contact a doctor if: You get a migraine headache that is different or worse than others you have had. You have more than 15 days of headaches in one month. Get help right away if: Your migraine headache gets very bad. Your migraine headache lasts more than 72 hours. You have a fever or stiff neck. You have trouble seeing. Your muscles feel weak or like you cannot control them. You lose your balance a lot. You have trouble walking. You faint. You have a seizure. This information is not intended to replace advice given to you by your health care provider. Make sure you discuss any questions you have with your health care provider. Document Revised: 12/11/2021 Document Reviewed: 12/11/2021 Elsevier Patient Education  2024 [employer].

## 2023-06-18 NOTE — Telephone Encounter (Signed)
 Spoke with Dr Vickey Huger and referral was sent to [facility] (P) [phone]   [phone]

## 2023-06-27 ENCOUNTER — Ambulatory Visit: Payer: Medicaid Other | Admitting: Neurology

## 2023-07-03 ENCOUNTER — Encounter: Payer: Medicaid Other | Admitting: Neurology

## 2023-08-14 ENCOUNTER — Encounter: Payer: Self-pay | Admitting: *Deleted

## 2023-09-03 DIAGNOSIS — Z0289 Encounter for other administrative examinations: Secondary | ICD-10-CM

## 2023-09-12 ENCOUNTER — Ambulatory Visit (HOSPITAL_COMMUNITY): Admission: EM | Admit: 2023-09-12 | Discharge: 2023-09-12 | Disposition: A | Discharge status: Home / Self Care

## 2023-09-12 ENCOUNTER — Encounter (HOSPITAL_COMMUNITY): Payer: Self-pay

## 2023-09-12 DIAGNOSIS — J069 Acute upper respiratory infection, unspecified: Secondary | ICD-10-CM | POA: Diagnosis not present

## 2023-09-12 LAB — POC COVID19/FLU A&B COMBO
Covid Antigen, POC: NEGATIVE
Influenza A Antigen, POC: NEGATIVE
Influenza B Antigen, POC: NEGATIVE

## 2023-09-12 NOTE — ED Triage Notes (Signed)
 Pt states that she has some chills, headache, cough, nasal congestion, chest congestion, fatigue and body aches x2 days

## 2023-09-12 NOTE — Discharge Instructions (Addendum)
 For most people and most upper respiratory infections, symptoms are self-limited. The usual course and duration of illness is up to 7-10 days. Because upper respiratory infections are usually caused by viruses, antibiotics will not help and often cause nausea and diarrhea.  Analgesics like Tylenol (acetaminophen) or Motrin  (ibuprofen ) may be used to relieve associated symptoms (eg, headache, ear pain, muscle and joint pains, and malaise). Supportive therapies are the main treatments you can perform including getting plenty of rest, drinking lots of fluids (water, juice, or broth), having warm tea or soup to help with sore throat, using cool-mist humidifier, using saline nose drops or spray to relieve stuffiness, and avoid smoking or being around smoke. You can also try over-the-counter medications including cough medicines that include an antihistamine and decongestant, cough drops, throat sprays, or inhaled/intranasal cromolyn sodium. You should return to the ED or UC if you experience any difficulty breathing, cough up blood, your symptoms do not improve/worsen, or if you are unable to keep down any food or liquids. Lastly, you should follow-up with your primary care provider in the next several days.

## 2023-09-12 NOTE — ED Provider Notes (Signed)
 [facility]    CSN: [id number] Arrival date & time: 09/12/23  1724      History   Chief Complaint Chief Complaint  Patient presents with   Chills    Chills, headache, cough, nasal congestion, chest congestion and body aches    HPI Jordan [name] is a 38 y.o. female.   Patient is a 38 year old female who presents to the urgent care today with concerns of bodyaches, chills, fatigue, congestion, sore throat, and cough.  She reports her symptoms began yesterday.  She has felt feverish but has not checked her temperature.  She has been taking daytime TheraFlu which helps with her symptoms temporarily.  She reports being around several people recently who have been sick as well and states that her daughter is now developing the same symptoms as her.  She denies any vomiting, chest pain, shortness of breath, abdominal pain, rash, diarrhea, drooling, or other concerns at this time.    Past Medical History:  Diagnosis Date   Anxiety    Asthma    Blood transfusion without reported diagnosis    COVID-19 09/02/2018   COVID-19 long hauler manifesting chronic dyspnea    Depression    HPV (human papilloma virus) infection    Infertility management    Ovarian cyst    Seizures (HCC)    Tachycardia    after covid, saw cardiologist    Patient Active Problem List   Diagnosis Date Noted   Cervical radiculopathy 04/02/2023   Occipital neuralgia of left side 04/02/2023   Vitamin B12 deficiency 05/25/2022   Other neutropenia (HCC) 04/22/2022   Genital warts 04/14/2020   Chronic dyspnea 10/10/2019   Sinus tachycardia 09/29/2019   Women's annual routine gynecological examination 09/23/2019    Past Surgical History:  Procedure Laterality Date   NO PAST SURGERIES      OB History     Gravida  1   Para  1   Term  1   Preterm  0   AB  0   Living  1      SAB  0   IAB  0   Ectopic  0   Multiple  0   Live Births  1            Home Medications     Prior to Admission medications   Medication Sig Start Date End Date Taking? Authorizing Provider  acetaminophen  (TYLENOL ) 325 MG tablet Take 325 mg by mouth every 6 (six) hours as needed.   Yes [provider]  butalbital -acetaminophen -caffeine  (FIORICET) 50-325-40 MG tablet Take 1 tablet by mouth every 6 (six) hours as needed for headache or migraine. 06/18/23 06/17/24  Dohmeier, Raoul Byes, MD  cyclobenzaprine  (FLEXERIL ) 5 MG tablet Take 1-2 tablets (5-10 mg total) by mouth 3 (three) times daily as needed for muscle spasms. 08/01/22   Coralyn Derry, PA-C  Magnesium 400 MG CAPS Take 40 mg by mouth as needed.    [provider]  ondansetron  (ZOFRAN ) 4 MG tablet Take 4 mg by mouth every 8 (eight) hours as needed for nausea or vomiting.    [provider]  propranolol ER (INDERAL LA) 160 MG SR capsule Take 160 mg by mouth daily.    [provider]    Family History Family History  Problem Relation Age of Onset   Stomach cancer Paternal Grandmother    Breast cancer Maternal Grandmother    Healthy Father    Healthy Mother    Migraines Mother  Social History Social History   Tobacco Use   Smoking status: Never   Smokeless tobacco: Never  Vaping Use   Vaping status: Never Used  Substance Use Topics   Alcohol use: No   Drug use: No     Allergies   Ivp dye [iodinated contrast media]   Review of Systems Review of Systems See HPI for relevant ROS.  Physical Exam Triage Vital Signs ED Triage Vitals  Encounter Vitals Group     BP 09/12/23 1757 (!) 141/86     Systolic BP Percentile --      Diastolic BP Percentile --      Pulse Rate 09/12/23 1757 (!) 106     Resp 09/12/23 1757 19     Temp 09/12/23 1757 98.6 F (37 C)     Temp Source 09/12/23 1757 Oral     SpO2 09/12/23 1757 98 %     Weight 09/12/23 1754 160 lb (72.6 kg)     Height 09/12/23 1754 5\' 6"  (1.676 m)     Head Circumference --      Peak Flow --      Pain Score 09/12/23 1754  10     Pain Loc --      Pain Education --      Exclude from Growth Chart --    No data found.  Updated Vital Signs BP (!) 141/86 (BP Location: Right Arm)   Pulse (!) 106   Temp 98.6 F (37 C) (Oral)   Resp 19   Ht 5\' 6"  (1.676 m)   Wt 160 lb (72.6 kg)   LMP 09/12/2023   SpO2 98%   BMI 25.82 kg/m   Visual Acuity Right Eye Distance:   Left Eye Distance:   Bilateral Distance:    Right Eye Near:   Left Eye Near:    Bilateral Near:     Physical Exam General: Alert and oriented, well-developed/well-nourished, calm, cooperative, no acute distress HEENT: Normocephalic atraumatic, moist mucous membranes, no scleral icterus, trachea midline, pharynx without erythema or tonsillar swelling or exudates Lungs: Speaking full sentences, non-labored respirations, no distress, clear to auscultation bilaterally Heart: Regular rhythm Abdomen:  Soft, nondistended Musculoskeletal: Moves all extremities well Neurologic: Awake, A&O x4, gait normal Integumentary: Warm, dry, normal for ethnicity, intact, no rash Psychiatric: Appropriate mood & affect  UC Treatments / Results  Labs (all labs ordered are listed, but only abnormal results are displayed) Labs Reviewed  POC COVID19/FLU A&B COMBO    EKG   Radiology No results found.  Procedures Procedures (including critical care time)  Medications Ordered in UC Medications - No data to display  Initial Impression / Assessment and Plan / UC Course  I have reviewed the triage vital signs and the nursing notes.  Pertinent labs & imaging results that were available during my care of the patient were reviewed by me and considered in my medical decision making (see chart for details).    Presents with cough, congestion, body aches, chills, and sore throat.  Differential diagnosis includes: URI, COVID, influenza, sinusitis, pneumonia, pharyngitis, allergies, including other diagnoses.  History obtained from: Patient.  Plan at this  time/rationale: Respiratory panel.  All ordered tests including imaging and labs were independently reviewed and interpreted by myself. Notable findings: Negative for COVID and influenza.  Plan: This patient presents with symptoms suspicious for likely viral upper respiratory infection. Based on history and physical doubt bacterial sinusitis. Do not suspect underlying cardiopulmonary process. I considered, but think unlikely, dangerous causes  of this patient's symptoms to include ACS, CHF or COPD exacerbations, pneumonia, or pneumothorax. Patient is nontoxic appearing, stable, and in no acute distress, therefore, patient likely stable for safe discharge home. Gave patient recommendations for over-the-counter medicines and remedies for symptomatic relief. Patient should follow-up with their primary care provider in the next several days.  Strict return precautions to the urgent care or emergency department were discussed including if symptoms worsen, if they become short of breath, or if they have any other concerns.  Disposition: Stable to discharge home.   All questions answered to the best of this examiner's ability. Advised to f/u with PCP for further eval and/or reassessment. Patient agrees to plan.  An appropriate evaluation has been performed, and in my medical judgment there is currently no evidence of an immediate life-threatening or surgical condition. Discharge is therefore indicated at this time.  This document was created using the aid of voice recognition Scientist, clinical (histocompatibility and immunogenetics).   Final Clinical Impressions(s) / UC Diagnoses   Final diagnoses:  None   Discharge Instructions   None    ED Prescriptions   None    PDMP not reviewed this encounter.   Edna Gouty, PA-C 09/12/23 1849

## 2023-12-23 ENCOUNTER — Ambulatory Visit (INDEPENDENT_AMBULATORY_CARE_PROVIDER_SITE_OTHER)

## 2023-12-23 ENCOUNTER — Encounter (HOSPITAL_COMMUNITY): Payer: Self-pay | Admitting: *Deleted

## 2023-12-23 ENCOUNTER — Ambulatory Visit (HOSPITAL_COMMUNITY)
Admission: EM | Admit: 2023-12-23 | Discharge: 2023-12-23 | Disposition: A | Discharge status: Home / Self Care | Attending: Family Medicine | Admitting: Family Medicine

## 2023-12-23 ENCOUNTER — Ambulatory Visit (HOSPITAL_COMMUNITY): Payer: Self-pay

## 2023-12-23 DIAGNOSIS — M79672 Pain in left foot: Secondary | ICD-10-CM

## 2023-12-23 DIAGNOSIS — M25572 Pain in left ankle and joints of left foot: Secondary | ICD-10-CM

## 2023-12-23 NOTE — Discharge Instructions (Signed)
 I do not see any broken bones on your xrays. The radiologist will also read your x-ray, and if their interpretation differs significantly from mine, and the management of your condition would change, we will call you.  You can take ibuprofen  over the counter 200 mg--3 tabs every 8 hours as needed for pain.  Ice and elevate that foot today and tomorrow as your are able.

## 2023-12-23 NOTE — ED Provider Notes (Signed)
 [facility]    CSN: [id number] Arrival date & time: 12/23/23  1145      History   Chief Complaint Chief Complaint  Patient presents with   Foot Injury    HPI Jordan [name] is a 38 y.o. female.    Foot Injury Here for pain in left foot and ankle. Last night she was coming down steps and missed a step, falling forward, with her left foot under her, plantar flexed.  Is allergic to IVP dye.  LMP 8/18    Past Medical History:  Diagnosis Date   Anxiety    Asthma    Blood transfusion without reported diagnosis    COVID-19 09/02/2018   COVID-19 long hauler manifesting chronic dyspnea    Depression    HPV (human papilloma virus) infection    Infertility management    Ovarian cyst    Seizures (HCC)    Tachycardia    after covid, saw cardiologist    Patient Active Problem List   Diagnosis Date Noted   Cervical radiculopathy 04/02/2023   Occipital neuralgia of left side 04/02/2023   Vitamin B12 deficiency 05/25/2022   Other neutropenia (HCC) 04/22/2022   Genital warts 04/14/2020   Chronic dyspnea 10/10/2019   Sinus tachycardia 09/29/2019   Women's annual routine gynecological examination 09/23/2019    Past Surgical History:  Procedure Laterality Date   NO PAST SURGERIES      OB History     Gravida  1   Para  1   Term  1   Preterm  0   AB  0   Living  1      SAB  0   IAB  0   Ectopic  0   Multiple  0   Live Births  1            Home Medications    Prior to Admission medications   Medication Sig Start Date End Date Taking? Authorizing Provider  acetaminophen  (TYLENOL ) 325 MG tablet Take 325 mg by mouth every 6 (six) hours as needed.   Yes [provider]  Probiotic Product (PROBIOTIC PO) Take by mouth.   Yes [provider]  cyclobenzaprine  (FLEXERIL ) 5 MG tablet Take 1-2 tablets (5-10 mg total) by mouth 3 (three) times daily as needed for muscle spasms. 08/01/22   Charlene Debby BROCKS, PA-C  Magnesium  400 MG CAPS Take 40 mg by mouth as needed.    [provider]    Family History Family History  Problem Relation Age of Onset   Stomach cancer Paternal Grandmother    Breast cancer Maternal Grandmother    Healthy Father    Healthy Mother    Migraines Mother     Social History Social History   Tobacco Use   Smoking status: Never   Smokeless tobacco: Never  Vaping Use   Vaping status: Never Used  Substance Use Topics   Alcohol use: No   Drug use: No     Allergies   Ivp dye [iodinated contrast media]   Review of Systems Review of Systems   Physical Exam Triage Vital Signs ED Triage Vitals [12/23/23 1236]  Encounter Vitals Group     BP (!) 135/91     Girls Systolic BP Percentile      Girls Diastolic BP Percentile      Boys Systolic BP Percentile      Boys Diastolic BP Percentile      Pulse Rate (!) 101  Resp 18     Temp 98.1 F (36.7 C)     Temp Source Oral     SpO2 97 %     Weight      Height      Head Circumference      Peak Flow      Pain Score 7     Pain Loc      Pain Education      Exclude from Growth Chart    No data found.  Updated Vital Signs BP (!) 135/91   Pulse (!) 101   Temp 98.1 F (36.7 C) (Oral)   Resp 18   LMP 12/16/2023 (Approximate)   SpO2 97%   Visual Acuity Right Eye Distance:   Left Eye Distance:   Bilateral Distance:    Right Eye Near:   Left Eye Near:    Bilateral Near:     Physical Exam Vitals reviewed.  Constitutional:      General: She is not in acute distress.    Appearance: She is not ill-appearing, toxic-appearing or diaphoretic.  Musculoskeletal:     Comments: There is tenderness and swelling over the left lateral malleolus, and the proximal mid foot on the left. Pulses normal. Cap refill normal.  Skin:    Coloration: Skin is not jaundiced or pale.  Neurological:     General: No focal deficit present.     Mental Status: She is alert and oriented to person, place, and time.      UC  Treatments / Results  Labs (all labs ordered are listed, but only abnormal results are displayed) Labs Reviewed - No data to display  EKG   Radiology No results found.  Procedures Procedures (including critical care time)  Medications Ordered in UC Medications - No data to display  Initial Impression / Assessment and Plan / UC Course  I have reviewed the triage vital signs and the nursing notes.  Pertinent labs & imaging results that were available during my care of the patient were reviewed by me and considered in my medical decision making (see chart for details).     By my review there are no fractures on the foot or ankle films. She is advised of radiology overread.  Boot is applied here, and she will take ibuprofen  for the pain at home. Contact info for podiatry is given Final Clinical Impressions(s) / UC Diagnoses   Final diagnoses:  Acute left ankle pain  Left foot pain     Discharge Instructions      I do not see any broken bones on your xrays. The radiologist will also read your x-ray, and if their interpretation differs significantly from mine, and the management of your condition would change, we will call you.  You can take ibuprofen  over the counter 200 mg--3 tabs every 8 hours as needed for pain.  Ice and elevate that foot today and tomorrow as your are able.       ED Prescriptions   None    PDMP not reviewed this encounter.   Vonna Sharlet POUR, MD 12/23/23 [id number]

## 2023-12-23 NOTE — ED Triage Notes (Addendum)
 Pt states she missed a step last night when it was dark, injuring left dorsal lateral foot. C/O swelling, pain, painful ambulation. LLE CMS intact. Took Tyl last night.

## 2024-01-13 ENCOUNTER — Encounter: Payer: Self-pay | Admitting: Oncology

## 2024-01-13 ENCOUNTER — Ambulatory Visit
Admission: EM | Admit: 2024-01-13 | Discharge: 2024-01-13 | Disposition: A | Discharge status: Home / Self Care | Attending: Family Medicine | Admitting: Family Medicine

## 2024-01-13 DIAGNOSIS — S46811A Strain of other muscles, fascia and tendons at shoulder and upper arm level, right arm, initial encounter: Secondary | ICD-10-CM

## 2024-01-13 DIAGNOSIS — S161XXA Strain of muscle, fascia and tendon at neck level, initial encounter: Secondary | ICD-10-CM

## 2024-01-13 DIAGNOSIS — R030 Elevated blood-pressure reading, without diagnosis of hypertension: Secondary | ICD-10-CM | POA: Diagnosis not present

## 2024-01-13 MED ORDER — CYCLOBENZAPRINE HCL 5 MG PO TABS: 5.0000 mg | ORAL_TABLET | Freq: Three times a day (TID) | ORAL | 0 refills | Status: AC | PRN

## 2024-01-13 MED ORDER — PREDNISONE 10 MG (21) PO TBPK: ORAL_TABLET | Freq: Every day | ORAL | 0 refills | Status: AC

## 2024-01-13 NOTE — ED Triage Notes (Signed)
 Patient states that she was on her way out of town Thursday and hit a limb. Patient states that she jerked on her breaks really hard and it jerked her. Patient states that she's having pain in her right arm and neck
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
## 2024-01-13 NOTE — Discharge Instructions (Addendum)
 Follow up with your primary care provider regarding your elevated blood pressure.  Your goal is <135 / 85. Monitor your blood pressures at home. Keep a log of your blood pressure for your doctor to review.    If medication was prescribed, stop by the pharmacy to pick up your prescriptions.  For your  pain, Take 1500 mg Tylenol  twice a day, take muscle relaxer (Flexeril /cyclobenzaprine ), as needed for pain. Take prednisone  as prescribed. Apply warm compresses intermittently, as needed.  As pain recedes, begin normal activities slowly as tolerated.  Follow up with an orthopedic provider, if symptoms persist.  Watch for worsening symptoms such as an increasing weakness or loss of sensation, increasing pain and/or the loss of bladder or bowel function. Should any of these occur, go to the emergency department immediately.   You have a condition requiring follow up with an orthopedic specialist. Call one of the following offices to schedule an appointment:   [facility] Address: [address] [place], [place] [address] Phone: [phone]  [facility] [address] [place], [place], [place] [address] Phone: [phone]  [facility] [address] [place],  [place]  [address] Phone: [phone]  [facility] [address] [place], [place] [address] Phone: [phone]
